# Patient Record
Sex: Female | Born: 1955
Health system: Southern US, Community
[De-identification: ages and names within clinical notes are randomized; demographics above are authoritative.]

## PROBLEM LIST (undated history)

## (undated) DIAGNOSIS — G4700 Insomnia, unspecified: Secondary | ICD-10-CM

## (undated) DIAGNOSIS — I498 Other specified cardiac arrhythmias: Secondary | ICD-10-CM

## (undated) DIAGNOSIS — G90A Postural orthostatic tachycardia syndrome (POTS): Secondary | ICD-10-CM

## (undated) DIAGNOSIS — R5381 Other malaise: Secondary | ICD-10-CM

## (undated) DIAGNOSIS — Z780 Asymptomatic menopausal state: Secondary | ICD-10-CM

## (undated) DIAGNOSIS — K5790 Diverticulosis of intestine, part unspecified, without perforation or abscess without bleeding: Secondary | ICD-10-CM

## (undated) DIAGNOSIS — I951 Orthostatic hypotension: Secondary | ICD-10-CM

## (undated) DIAGNOSIS — R000 Tachycardia, unspecified: Secondary | ICD-10-CM

## (undated) DIAGNOSIS — I429 Cardiomyopathy, unspecified: Secondary | ICD-10-CM

## (undated) DIAGNOSIS — M810 Age-related osteoporosis without current pathological fracture: Secondary | ICD-10-CM

## (undated) DIAGNOSIS — R5383 Other fatigue: Secondary | ICD-10-CM

## (undated) HISTORY — DX: Cardiomyopathy, unspecified: I42.9

## (undated) HISTORY — DX: Other specified cardiac arrhythmias: I49.8

## (undated) HISTORY — PX: OTHER SURGICAL HISTORY: SHX169

## (undated) HISTORY — DX: Other fatigue: R53.83

## (undated) HISTORY — DX: Insomnia, unspecified: G47.00

## (undated) HISTORY — PX: FINGER SURGERY: SHX640

## (undated) HISTORY — DX: Orthostatic hypotension: I95.1

## (undated) HISTORY — DX: Postural orthostatic tachycardia syndrome (POTS): G90.A

## (undated) HISTORY — DX: Other malaise: R53.81

## (undated) HISTORY — DX: Age-related osteoporosis without current pathological fracture: M81.0

## (undated) HISTORY — DX: Tachycardia, unspecified: R00.0

## (undated) HISTORY — DX: Asymptomatic menopausal state: Z78.0

---
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
## 2002-04-17 ENCOUNTER — Other Ambulatory Visit: Admission: RE | Admit: 2002-04-17 | Discharge: 2002-04-17 | Payer: Self-pay | Admitting: Internal Medicine

## 2003-09-10 ENCOUNTER — Other Ambulatory Visit: Admission: RE | Admit: 2003-09-10 | Discharge: 2003-09-10 | Payer: Self-pay | Admitting: Internal Medicine

## 2003-09-26 ENCOUNTER — Ambulatory Visit (HOSPITAL_BASED_OUTPATIENT_CLINIC_OR_DEPARTMENT_OTHER): Admission: RE | Admit: 2003-09-26 | Discharge: 2003-09-26 | Payer: Self-pay | Admitting: Allergy and Immunology

## 2003-09-26 ENCOUNTER — Encounter: Payer: Self-pay | Admitting: Pulmonary Disease

## 2004-05-16 ENCOUNTER — Encounter: Payer: Self-pay | Admitting: Sports Medicine

## 2004-09-02 ENCOUNTER — Ambulatory Visit (HOSPITAL_COMMUNITY): Admission: RE | Admit: 2004-09-02 | Discharge: 2004-09-02 | Payer: Self-pay | Admitting: Internal Medicine

## 2004-09-12 ENCOUNTER — Other Ambulatory Visit: Admission: RE | Admit: 2004-09-12 | Discharge: 2004-09-12 | Payer: Self-pay | Admitting: Internal Medicine

## 2005-08-24 ENCOUNTER — Ambulatory Visit (HOSPITAL_COMMUNITY): Admission: RE | Admit: 2005-08-24 | Discharge: 2005-08-24 | Payer: Self-pay | Admitting: Family Medicine

## 2007-10-30 ENCOUNTER — Ambulatory Visit: Payer: Self-pay | Admitting: Sports Medicine

## 2007-10-30 DIAGNOSIS — I951 Orthostatic hypotension: Secondary | ICD-10-CM | POA: Insufficient documentation

## 2007-10-30 DIAGNOSIS — M357 Hypermobility syndrome: Secondary | ICD-10-CM | POA: Insufficient documentation

## 2007-10-30 DIAGNOSIS — I341 Nonrheumatic mitral (valve) prolapse: Secondary | ICD-10-CM | POA: Insufficient documentation

## 2007-11-27 ENCOUNTER — Ambulatory Visit: Payer: Self-pay | Admitting: Sports Medicine

## 2007-11-27 DIAGNOSIS — R5383 Other fatigue: Secondary | ICD-10-CM

## 2007-11-27 DIAGNOSIS — R5381 Other malaise: Secondary | ICD-10-CM | POA: Insufficient documentation

## 2008-01-08 ENCOUNTER — Ambulatory Visit: Payer: Self-pay | Admitting: Sports Medicine

## 2008-03-11 ENCOUNTER — Ambulatory Visit: Payer: Self-pay | Admitting: Pulmonary Disease

## 2008-03-11 DIAGNOSIS — G4700 Insomnia, unspecified: Secondary | ICD-10-CM | POA: Insufficient documentation

## 2008-03-16 ENCOUNTER — Ambulatory Visit (HOSPITAL_BASED_OUTPATIENT_CLINIC_OR_DEPARTMENT_OTHER): Admission: RE | Admit: 2008-03-16 | Discharge: 2008-03-16 | Payer: Self-pay | Admitting: Family Medicine

## 2008-04-02 ENCOUNTER — Telehealth: Payer: Self-pay | Admitting: Pulmonary Disease

## 2008-04-22 ENCOUNTER — Ambulatory Visit: Payer: Self-pay | Admitting: Pulmonary Disease

## 2008-04-22 DIAGNOSIS — F518 Other sleep disorders not due to a substance or known physiological condition: Secondary | ICD-10-CM | POA: Insufficient documentation

## 2008-06-01 ENCOUNTER — Ambulatory Visit: Payer: Self-pay | Admitting: Family Medicine

## 2008-06-02 ENCOUNTER — Telehealth: Payer: Self-pay | Admitting: Family Medicine

## 2008-12-15 ENCOUNTER — Ambulatory Visit: Payer: Self-pay | Admitting: Sports Medicine

## 2008-12-15 DIAGNOSIS — G43909 Migraine, unspecified, not intractable, without status migrainosus: Secondary | ICD-10-CM | POA: Insufficient documentation

## 2008-12-22 ENCOUNTER — Telehealth: Payer: Self-pay | Admitting: Sports Medicine

## 2008-12-23 ENCOUNTER — Telehealth: Payer: Self-pay | Admitting: Sports Medicine

## 2009-01-12 ENCOUNTER — Ambulatory Visit: Payer: Self-pay | Admitting: Sports Medicine

## 2009-01-12 DIAGNOSIS — R000 Tachycardia, unspecified: Secondary | ICD-10-CM | POA: Insufficient documentation

## 2009-03-17 ENCOUNTER — Ambulatory Visit: Payer: Self-pay | Admitting: Diagnostic Radiology

## 2009-03-17 ENCOUNTER — Ambulatory Visit (HOSPITAL_BASED_OUTPATIENT_CLINIC_OR_DEPARTMENT_OTHER): Admission: RE | Admit: 2009-03-17 | Discharge: 2009-03-17 | Payer: Self-pay | Admitting: Family Medicine

## 2009-06-13 ENCOUNTER — Ambulatory Visit: Payer: Self-pay | Admitting: Sports Medicine

## 2009-07-25 ENCOUNTER — Encounter: Payer: Self-pay | Admitting: Sports Medicine

## 2009-07-26 ENCOUNTER — Ambulatory Visit: Payer: Self-pay

## 2009-08-23 ENCOUNTER — Ambulatory Visit: Payer: Self-pay | Admitting: Sports Medicine

## 2010-04-18 ENCOUNTER — Ambulatory Visit (HOSPITAL_BASED_OUTPATIENT_CLINIC_OR_DEPARTMENT_OTHER): Admission: RE | Admit: 2010-04-18 | Discharge: 2010-04-18 | Payer: Self-pay | Admitting: Specialist

## 2010-04-18 ENCOUNTER — Ambulatory Visit: Payer: Self-pay | Admitting: Diagnostic Radiology

## 2010-08-22 NOTE — Progress Notes (Signed)
Summary: Triage  Phone Note Call from Patient Call back at [phone]   Summary of Call: wants to discuss rx that was prescribed yesterday, isn't bring heart rate down, it is 120. Initial call taken by: Haydee Salter,  June 02, 2008 12:19 PM  Follow-up for Phone Call        patient states she did not take the cardizem yesterday but this AM took one tablet at 5:00 AM and at 10:00 pulse rate was not down so she took another . consulted with Dr. Darrin Nipper and he advises not to take any more today but tomorrow if pulse rate not down may take 60 mg cardizem in AM and 60 mg in PM and then continue with this dose. call back to report. Follow-up by: Theresia Lo RN,  June 02, 2008 12:32 PM

## 2010-08-22 NOTE — Assessment & Plan Note (Signed)
Summary: FOR Buckley Bradly FU POTS SYNDROME/JW   Vital Signs:  Patient profile:   55 year old female Pulse rate:   107 / minute BP sitting:   108 / 77  (left arm)  Vitals Entered By: Terese Door (January 12, 2009 2:14 PM) CC: f/u POTS SYNDROME   Primary Care Provider:  Tresa Endo ([facility]  CC:  f/u POTS SYNDROME.  History of Present Illness: S: 55 y/o female here to f/u POTS syndrome. Heart rate under good control (will go to 135 if she doesnt take beta blocker). Klonipin 0.5 mg not helping so tried 1 and then 2 mg but that still didnt help.Then she tried  Amitriptyline but made her really groggy and sedated at 25 and 12.5 mg.  The last two night she has taken 3 mg of klonipin and has had less "hangover" in the morning.   Allergies: No Known Drug Allergies  Physical Exam  General:  Well-developed,well-nourished,in no acute distress; alert,appropriate and cooperative throughout examination   Impression & Recommendations:  Problem # 1:  PERSISTENT DISORDER INITIATING/MAINTAINING SLEEP (ICD-307.42) Assessment Unchanged Recent improvement on 3 mg Klonipin at bedtime but this is a worrisomely high dose and she is likely to quickly develop tolerance.  Will instead try her on valium 5 mg (may need to increase the number of tablets every 5 days). Once sleep cycle is restored will have her get back into an exercise program. stop amitriptyline.  Problem # 2:  UNSPECIFIED TACHYCARDIA (ICD-785.0) Assessment: Improved well controlled if she takes beta blocker. Recommended she continue this a 1 full tablet of Toprol XL 25 mg   Complete Medication List: 1)  Low Estrogen Tabs  .... Take 1 tablet by mouth once a day 2)  Maxalt 10 Mg Tabs (Rizatriptan benzoate) .... 1/2 - 1 by mouth as needed migraine 3)  Metoprolol Succinate 25 Mg Xr24h-tab (Metoprolol succinate) .... 1/2 -1 by mouth qday 4)  Valium 5 Mg Tabs (Diazepam)

## 2010-08-22 NOTE — Assessment & Plan Note (Signed)
Summary: FU POTS   Vital Signs:  Patient profile:   55 year old female Height:      69 inches Weight:      150 pounds BMI:     22.23 Pulse rate:   102 / minute BP sitting:   120 / 84  Vitals Entered By: Lillia Pauls CMA (June 13, 2009 8:50 AM)  Referring Provider:  self referral Primary Provider:  Tresa Endo ([facility]   History of Present Illness: Had been doing well with POTS Had been stable on Toprol once daily for 1 year 2 to 3 wks now does not seem to be in control HR jumping with just standing Rates up to about 130 and BP drops  Taking hormones for menopausal sxs these are not standard brands  seems that the change in sxs came about 1 week after she switched hormones  nvertheless she wants to try a difft medication as the beta blocker never makes her feel good  note frequent migraine and using sumatriptan 3 to 4 times per week   Allergies: No Known Drug Allergies  Physical Exam  General:  Well-developed,well-nourished,in no acute distress; alert,appropriate and cooperative throughout examination Lungs:  Normal respiratory effort, chest expands symmetrically. Lungs are clear to auscultation, no crackles or wheezes. Heart:   regular rhythm. S1 and S2 normal without gallop, murmur, click, rub or other extra sounds.  Rate is 100 sitting Standing immediately jumps to 130 she does get dizziness with standing and then rate fluctuates from 105 to 130   Impression & Recommendations:  Problem # 1:  UNSPECIFIED TACHYCARDIA (ICD-785.0) This is challenging as she has not tolerated standard meds for POTS  will try 1 month course of verapamil xr 120 hope HR response control will balance the BP lowering effect  she will keep record hold metoprolol during this time  try to get hormone regulation standardized as this could be affecting her overall sxs  we will reck in  1 month  Problem # 2:  MIGRAINE HEADACHE (ICD-346.90)  The following medications were  removed from the medication list:    Maxalt 10 Mg Tabs (Rizatriptan benzoate) .Marland Kitchen... 1/2 - 1 by mouth as needed migraine Her updated medication list for this problem includes:       Sumatriptan Succinate 100 Mg Tabs (Sumatriptan succinate) ..... Use 1/2 to 1 once daily as needed for migraine; may repeat x 1  hoping verapamil may also cut frequency suspect headaches tied to poorer control of POTs and stress hormone release  The following medications were removed from the medication list:    Maxalt 10 Mg Tabs (Rizatriptan benzoate) .Marland Kitchen... 1/2 - 1 by mouth as needed migraine Her updated medication list for this problem includes:     Sumatriptan Succinate 100 Mg Tabs (Sumatriptan succinate) ..... Use 1/2 to 1 once daily as needed for migraine; may repeat x 1  Problem # 3:  FATIGUE (ICD-780.79) I think we should try strength work at high resistance and low reps do aerobic work on bike at [facility] after 1 month  Complete Medication List: 1)  Low Estrogen Tabs  .... Take 1 tablet by mouth once a day 2)  Metoprolol Succinate 25 Mg Xr24h-tab (Metoprolol succinate) .... 1/2 -1 by mouth qday 3)  Valium 5 Mg Tabs (Diazepam) 4)  Sumatriptan Succinate 100 Mg Tabs (Sumatriptan succinate) .... Use 1/2 to 1 once daily as needed for migraine; may repeat x 1 5)  Diazepam 5 Mg Tabs (Diazepam) .Marland KitchenMarland KitchenMarland Kitchen 1  by mouth at bedtime prn 6)  Verapamil Hcl Cr 120 Mg Cr-tabs (Verapamil hcl) .Marland Kitchen.. 1 by mouth qd Prescriptions: VALIUM 5 MG TABS (DIAZEPAM)   #30 x 3   Entered by:   Lillia Pauls CMA   Authorized by:   Enid Baas MD   Signed by:   Lillia Pauls CMA on 06/13/2009   Method used:   Print then Give to Patient   RxID:   [id number] VERAPAMIL HCL CR 120 MG CR-TABS (VERAPAMIL HCL) 1 by mouth qd  #30 x 3   Entered and Authorized by:   Enid Baas MD   Signed by:   Enid Baas MD on 06/13/2009   Method used:   [facility] [phone]* (retail)       [address]        [place], [place]  [address]       Ph: [phone]       Fax: [phone]   RxID:   [id number]

## 2010-08-22 NOTE — Assessment & Plan Note (Signed)
Summary: np/husband kim sees Tracy Cook/eo   Vital Signs:  Patient Profile:   55 Years Old Female Height:     70 inches Weight:      149.8 pounds BMI:     21.57 Pulse rate:   110 / minute BP sitting:   123 / 87  (right arm)  Pt. in pain?   no  Vitals Entered By: Arlyss Repress CMA, (October 30, 2007 8:44 AM)              Is Patient Diabetic? No     Chief Complaint:  discuss posturel orthostatic tachycardia syndrome. resting pulse rate 90. pulse up to 132.  History of Present Illness: Fascinating hx of POTS  HR goes to 130 with standing had syncope in shower while young but not with sports or activity has associated GI sxs - bloating and constipation often Gymnast in HS has also associated hypermobility and was even assessed to be sure not marfan's  Went to [facility] where they are researching this: + tilt table Trial fluid loading - did not help florinef - did not help SSRIs - felt terrible/ trial wellbutrin flet better but WBCs droppped Conidine - no help metoprolol - low dose  controlled HR but BP dropped and felt tired all time now off med for 3 years  Now this is worse no syncope HR often runs 120 at rest standing at 130 to 135 she seems fatigued much of time cannot workout as this makes it much worse and she is too tired wants to consider new RX     Social History:    works as Designer, industrial/product    married kim Sakamoto - Clinical research associate in [employer]   Risk Factors:  Tobacco use:  never    Physical Exam  General:     Well-developed,well-nourished,in no acute distress; alert,appropriate and cooperative throughout examination Head:     Normocephalic and atraumatic without obvious abnormalities. No apparent alopecia or balding. Lungs:     Normal respiratory effort, chest expands symmetrically. Lungs are clear to auscultation, no crackles or wheezes. Heart:     Mid systolic click is intemittent HR 120 at rest Standing HR is 132 Valsalva drops to 74 but then comes  back up in 30 secs to 120 + no murmur.   Msk:     + Hypermobility screen elbows > 180 deg Lt thumb to wrist hands to floor no recurvatum shoulder hypermobility Lt MCP increased mobility Beighton score is 4 to 5  no pectus no lens prob no wing span > height skin - not excessively lax    Impression & Recommendations:  Problem # 1:  POSTURAL HYPOTENSION (ICD-458.0) Has good hx and many concomitant features or POTS she has had good eval at [facility] and I would like to review tests trial on many meds  beta blockers worked but she did not tolerate metoprolol and others never tried Coreg will start slow and build up see in 1 mo  Orders: [facility]- Est  Level 4 ([address]782)   Problem # 2:  HYPERMOBILITY SYNDROME (ICD-728.5) would like to eval echo no rx at present Orders: [facility]- Est  Level 4 ([address]214)   Problem # 3:  MITRAL VALVE PROLAPSE (ICD-424.0)  Her updated medication list for this problem includes:    Carvedilol 3.125 Mg Tabs (Carvedilol) .Marland Kitchen... 1 by mouth bid  stable but may benefit from coreg Orders: [facility]- Est  Level 4 ([address])   Complete Medication List: 1)  Carvedilol 3.125 Mg  Tabs (Carvedilol) .Marland Kitchen.. 1 by mouth bid   Patient Instructions: 1)  start 3.125 coreg twice a day 3 days 2)  then 6.25 in AM and 3.125 inPM 3days 3)  then 6.25 two times a day for 1 week 4)  hold here if doing OK 5)  otherwise 6)  9.375 in AM and 6.25 in PM for 1 week 7)  then 9.375 twice a day 8)  for 1 week 9)  then recheck    Prescriptions: CARVEDILOL 3.125 MG  TABS (CARVEDILOL) 1 by mouth bid  #100 x 3   Entered and Authorized by:   Enid Baas MD   Signed by:   Enid Baas MD on 10/30/2007   Method used:   Electronically sent to ...       [facility] [address] # [phone] [address]       [place], [place]  [address]       Ph: [phone]       Fax: [phone]   RxID:   [id number]  ]

## 2010-08-22 NOTE — Assessment & Plan Note (Signed)
Summary: 1 MONTH FU/EO   Vital Signs:  Patient Profile:   55 Years Old Female Height:     70 inches Pulse rate:   94 / minute BP sitting:   114 / 84  (left arm)  Pt. in pain?   no  Vitals Entered By: Arlyss Repress CMA, (Nov 27, 2007 11:41 AM)              Is Patient Diabetic? No     Chief Complaint:  f/up and discuss echocardiogram.  History of Present Illness: POTS  Today brings hx records from medication trials at [facility] she tried multiple meds with sideeffects from most some did stop tachycardia but sxs too severe  ECHO - some MVP  EKG - not remarkable  Since last visit on Coreg 3.125 she does control rate much better Finds she has more constipation and if she tries to take dose higher she feels tired However, she feels tired every day with tachycardia feels she cannot exrecise because of fatigue  GI sxs of constipation are much worse with most medicines  Sleep disorder is a major issue Never feels rested MM ache at night when she tried melatonin she ran into side effects of grogginess next day  [facility] trial documents response to mult drugs baseline is HR 71 and standing of 129 various meds dropped standing HR to 100 range key prob has been drug intolerance and perhaps discouraged as she does not wish to take drugs        Physical Exam  General:     Well-developed,well-nourished,in no acute distress; alert,appropriate and cooperative throughout examination Head:     Normocephalic and atraumatic without obvious abnormalities. No apparent alopecia or balding. Heart:     now has resting HR of 84 standing on 3 trials after single dose of Coreg 3.125 shows HR peaks at 109 adn within 20 secs drops into 80 to 90 range  I do not hear click or Murmur today    Impression & Recommendations:  Problem # 1:  POSTURAL HYPOTENSION (ICD-458.0) I think she needs a trial of coreg 3.125 for 1 mo and see if sideeffects wain aggressively tx the  constipation so this does not become worse does not drink at work  nees to get 20 oz gatorade at breaks Orders: [facility]- Est  Level 4 ([address])   Problem # 2:  INSOMNIA D/T MEDICAL COND CLASSIFIED ELSEWHERE (ICD-327.01) This is part of overall syndrome and a major cause of her fatiigue suggest low dose amitriptyline 10 mg at HS and increase to 20 if no response  She may need a trial away from work and see if we can reestablish a baseline exercise level that will help her deal with life better and work to control excess fatigue and POTS  re ck 1 mo Orders: [facility]- Est  Level 4 ([address])   Problem # 3:  FATIGUE, CHRONIC (ICD-780.79) this is associated with POTS but also she has chronic mm aches will try low dose tramadol to see if we can lessen these sxs  Complete Medication List: 1)  Carvedilol 3.125 Mg Tabs (Carvedilol) .Marland Kitchen.. 1 by mouth bid 2)  Tramadol Hcl 50 Mg Tabs (Tramadol hcl) .Marland Kitchen.. 1 by mouth q6 hr prn 3)  Amitriptyline Hcl 10 Mg Tabs (Amitriptyline hcl) .Marland Kitchen.. 1 or 2 by mouth at bedtime as directed    Prescriptions: AMITRIPTYLINE HCL 10 MG  TABS (AMITRIPTYLINE HCL) 1 or 2 by mouth at bedtime as directed  #60 x  3   Entered and Authorized by:   Enid Baas MD   Signed by:   Enid Baas MD on 11/27/2007   Method used:   Electronically sent to ...       [facility] # [phone] [address]. [address]       [place], [place]  [address]       Ph: [phone]       Fax: [phone]   RxID:   [id number] TRAMADOL HCL 50 MG  TABS (TRAMADOL HCL) 1 by mouth q6 hr prn  #100 x 3   Entered and Authorized by:   Enid Baas MD   Signed by:   Enid Baas MD on 11/27/2007   Method used:   Electronically sent to ...       [facility] [address] # [phone] [address]       [place], [place]  [address]       Ph: [phone]       Fax: [phone]   RxID:   [id number]  ]

## 2010-08-22 NOTE — Progress Notes (Signed)
Summary: increase did not help  Phone Note Call from Patient Call back at [phone]   Caller: Patient Reason for Call: Talk to Nurse, Talk to Doctor Summary of Call: Increasing med did not help her to sleep.  [facility]. Initial call taken by: Levada Schilling,  December 23, 2008 10:22 AM    I advised her to stick with this dose for 3 or 4 days.  If not better at that point I would suggest we try ambien.

## 2010-08-22 NOTE — Letter (Signed)
Summary: [facility]  [facility]   Imported By: Marily Memos 08/23/2009 09:28:42  _____________________________________________________________________  External Attachment:    Type:   Image     Comment:   External Document

## 2010-08-22 NOTE — Progress Notes (Signed)
Summary: increase med  Phone Note Call from Patient Call back at [phone]   Caller: Patient Reason for Call: Talk to Nurse, Talk to Doctor Summary of Call: Wants to increase clonazepam dose.  Request return call. Initial call taken by: Levada Schilling,  December 22, 2008 2:28 PM    OK to increase Klonopin to 1 mgm at hs and see if this helps her get into a sleep pattern. she will let us know response.

## 2010-08-22 NOTE — Assessment & Plan Note (Signed)
Summary: FOR Tracy Cook/JW   Vital Signs:  Patient profile:   55 year old female Height:      69 inches Weight:      145 pounds Pulse rate:   86 / minute BP sitting:   120 / 70  Vitals Entered By: Lillia Pauls CMA (Dec 15, 2008 2:13 PM)  Primary Care Provider:  Tresa Endo ([facility]   History of Present Illness: 55 y/o female with POTS syndrome here to discuss management. She is currently taking 1/2 -1 metoprolol 25 mg tablets daily. This is working well to control her tachycardia (has tried CCB, florinef, and NACL in the past).  C/o fatigue, insomnia, and decreased exercise tolerance. also she is worried that she may have Marfans syndrome (in the past we diagnosed her as benign hypermobility syndrome and she did not have physical characteristics to qualify as Marfans).  Describes problems falling asleep. in the past she has tried clonipin which helped a lot but she stopped it because was developing a tolerance. she also thinks she has restless leg syndrome. she has had a normal sleep study. we also looked at her ECHO study from 5 years ago and the aorta was normal  Allergies: No Known Drug Allergies  Physical Exam  General:  Well-developed,well-nourished,in no acute distress; alert,appropriate and cooperative throughout examination Lungs:  Normal respiratory effort, chest expands symmetrically. Lungs are clear to auscultation, no crackles or wheezes. Heart:  midsystolic click   Impression & Recommendations:  Problem # 1:  PERSISTENT DISORDER INITIATING/MAINTAINING SLEEP (ICD-307.42) start clonipin to help reset sleep cycle. can try alternate this with another sleep aid.   Problem # 2:  POSTURAL HYPOTENSION (ICD-458.0) Assessment: Improved tachycardia better -controlled. continue metoprolol. will have her try to restart exercise program once her sleep is better. also we do not think she has Marfans  Problem # 3:  FATIGUE (ICD-780.79) Assessment: New due to inadequate  sleep  Problem # 4:  MIGRAINE HEADACHE (ICD-346.90) Assessment: Unchanged refilled maxalt Her updated medication list for this problem includes:    Maxalt 10 Mg Tabs (Rizatriptan benzoate) .Marland Kitchen... 1/2 - 1 by mouth as needed migraine    Metoprolol Succinate 25 Mg Xr24h-tab (Metoprolol succinate) .Marland Kitchen... 1/2 -1 by mouth qday  Complete Medication List: 1)  Low Estrogen Tabs  .... Take 1 tablet by mouth once a day 2)  Rozerem 8 Mg Tabs (Ramelteon) .... Take 1 tab by mouth at bedtime 3)  Klonopin 0.5 Mg Tabs (Clonazepam) .Marland Kitchen.. 1 by mouth at bedtime prn 4)  Maxalt 10 Mg Tabs (Rizatriptan benzoate) .... 1/2 - 1 by mouth as needed migraine 5)  Metoprolol Succinate 25 Mg Xr24h-tab (Metoprolol succinate) .... 1/2 -1 by mouth qday  Patient Instructions: 1)  f/u in 1 month 2)  in a week(after you are sleeping better)  try to start 10 minutes of exercise twice daily  3)  start Klonipin as needed for sleep  4)  we refilled maxalt and metoprolol Prescriptions: METOPROLOL SUCCINATE 25 MG XR24H-TAB (METOPROLOL SUCCINATE) 1/2 -1 by mouth qday  #30 x 5   Entered and Authorized by:   Gabrielle Dare MD   Signed by:   Gabrielle Dare MD on 12/15/2008   Method used:   [facility]        [facility] [phone]* (retail)       [address]       [place], [place]  [address]  Ph: [phone]       Fax: [phone]   RxID:   [id number] MAXALT 10 MG TABS (RIZATRIPTAN BENZOATE) 1/2 - 1 by mouth as needed migraine  #12 x 6   Entered and Authorized by:   Gabrielle Dare MD   Signed by:   Gabrielle Dare MD on 12/15/2008   Method used:   [facility] to        [facility] [phone]* (retail)       [address]       [place], [place]  [address]       Ph: [phone]       Fax: [phone]   RxID:   [id number] KLONOPIN 0.5 MG TABS (CLONAZEPAM) 1 by mouth at bedtime prn  #30 x 2   Entered and Authorized by:   Gabrielle Dare  MD   Signed by:   Gabrielle Dare MD on 12/15/2008   Method used:   Handwritten   RxID:   [id number]

## 2010-08-22 NOTE — Assessment & Plan Note (Signed)
Summary: beta blocker wp   Vital Signs:  Patient Profile:   55 Years Old Female Height:     70 inches Weight:      150.8 pounds BMI:     21.72 Temp:     97.9 degrees F oral Pulse rate:   135 / minute BP sitting:   116 / 82  (left arm) Cuff size:   regular  Pt. in pain?   no  Vitals Entered By: Garen Grams LPN (June 01, 2008 10:26 AM)                   Referred by:  self referral PCP:  Tresa Endo ([facility]  Chief Complaint:  wants to dicuss medication.  History of Present Illness: Patient formerly of Dr. Darrick Penna, here for followup on POTS syndrome which she has been dealing with for over 10 years.  Initial workup included cardiologist workup with tilt table testing; she has been treated with high-salt diet and florinef, neither of which were helpful.  Dr Darrick Penna had started her with Coreg and this was changed to Metoprolol, which causes less fatigue and joint aches than the Coreg.    She is interested in getting off the B blockers and trying a different type of medication to control her pulse rate.  She does not suffer from hypotension as much as fast pulse.  Wears a wrist pulse meter.     Current Allergies: No known allergies       Physical Exam  General:     Well-developed,well-nourished,in no acute distress; alert,appropriate and cooperative throughout examination Lungs:     Normal respiratory effort, chest expands symmetrically. Lungs are clear to auscultation, no crackles or wheezes. Heart:     Normal rate and regular rhythm. S1 and S2 normal without gallop, murmur, click, rub or other extra sounds. Pulses:     Normal radial pulses bilaterally.     Impression & Recommendations:  Problem # 1:  POSTURAL HYPOTENSION (ICD-458.0) Patient with a long history of POTS syndrome, and this has not been satisfactorily treated with florinef or NaCl.  She is not keen on staying on the B blockers due to side effects.  We have discussed other sympatholytic  medications, would consider treatment with diltiazem at low doses for SA nodal blockade. She may go up from 30mg  two times a day to 60mg  two times a day if no improvement.    Also would consider use of midodrine or methyldopa if not improved with the cardizem.  She is encouraged to call the office if not improved, and we can discuss other options.  Orders: [facility]- Est  Level 4 ([address])   Complete Medication List: 1)  Low Estrogen Tabs  .... Take 1 tablet by mouth once a day 2)  Rozerem 8 Mg Tabs (Ramelteon) .... Take 1 tab by mouth at bedtime 3)  Cardizem 30 Mg Tabs (Diltiazem hcl) .... Sig: take 1 tab by mouth two times a day    Prescriptions: CARDIZEM 30 MG TABS (DILTIAZEM HCL) SIG: Take 1 tab by mouth two times a day  #60 x 4   Entered and Authorized by:   Paula Compton MD   Signed by:   Paula Compton MD on 06/01/2008   Method used:   [facility] to        [facility] [phone]* (retail)       [address]       [place],  [place]  [address]       Ph: [phone]       Fax: [phone]   RxID:   [id number]  ]

## 2010-08-22 NOTE — Progress Notes (Signed)
Summary: Rozerem rx  Phone Note Call from Patient Call back at [phone]   Caller: Patient Call For: Roni Scow Summary of Call: need prescript for rozerem 8 mg had samples only pharmacy [facility]  [phone] Initial call taken by: Rickard Patience,  April 02, 2008 10:42 AM  Follow-up for Phone Call        [facility]. Pt seen 03-11-08 and KC wanted to see pt back in 3 weeks. Pt's next ov is on 04-22-08. KC out until 04-05-08. Will have to get ok to send RX on Rozerem. Michel Bickers [facility]  April 02, 2008 11:24 AM  Additional Follow-up for Phone Call Additional follow up Details #1::        ok to call in enough to get him to his ov . Additional Follow-up by: Barbaraann Share MD,  April 05, 2008 12:18 PM    Additional Follow-up for Phone Call Additional follow up Details #2::    Sent in rx for Rozerem electronically to pharmacy per [facility].  Attempted to notify pt.  LMOM rx requested has been sent to pharmacy.  Advised pt TCB with any prblems or questions. Follow-up by: Cloyde Reams RN,  April 05, 2008 1:07 PM  New/Updated Medications: ROZEREM 8 MG TABS (RAMELTEON) Take 1 tab by mouth at bedtime   Prescriptions: ROZEREM 8 MG TABS (RAMELTEON) Take 1 tab by mouth at bedtime  #30 x 0   Entered by:   Cloyde Reams RN   Authorized by:   Barbaraann Share MD   Signed by:   Cloyde Reams RN on 04/05/2008   Method used:   [facility] to        [facility] [phone]* (retail)       [address]       [place], [place]  [address]       Ph: [phone]       Fax: [phone]   RxID:   [id number]

## 2010-08-22 NOTE — Assessment & Plan Note (Signed)
Summary: F/U POTS   Vital Signs:  Patient profile:   55 year old female BP sitting:   104 / 84  Vitals Entered By: Lillia Pauls CMA (July 26, 2009 10:02 AM)  Referring Provider:  self referral Primary Provider:  Tresa Endo ([facility].)   History of Present Illness: Pt presents for follow up of POTS disease. At her last visit, she was placed on Verapamil. She took this for one week but felt continued fatigue more than she used to with just her Toprol. She has also been looking into a program at [facility] in [place] with Dr. Lenis Noon about a high sodium diet of 8 grams of sodium daily in addition to certain exercises without any medications. She tried this herself at home for about 10 days but was not able to tolerate it because her heart rate was staying at about 150 beats simply at rest. She woke up at night because of chest pain so then stopped this as well. She then went back to her routine of Toprol XL 25mg  daily plus weight training which she has been doing regularly with a trainer now for 6-8 weeks. She is doing well overall now but is still interested in the program at [facility].   Allergies: No Known Drug Allergies  Physical Exam  General:  Well-developed,well-nourished,in no acute distress; alert,appropriate and cooperative throughout examination Heart:  on standing her heart monitor watch shows that without her toprol within 2 minutes her heart rate climbs to 150 range while seated with no meds she stays in the 125 - 130 range   Impression & Recommendations:  Problem # 1:  UNSPECIFIED TACHYCARDIA (ICD-785.0) cont on toprol for now we will review program thru Dr Lenis Noon supplement salt and gatorade - 1/2 tsp in 20 ozs of gatorade two times a day to qid   monitor sxs and HR with log for next month  Problem # 2:  FATIGUE (ICD-780.79) continue working with personal trainer I think exercise program may lessen sxs and degree of cardiac stress  Complete Medication  List: 1)  Low Estrogen Tabs  .... Take 1 tablet by mouth once a day 2)  Metoprolol Succinate 25 Mg Xr24h-tab (Metoprolol succinate) .... 1/2 -1 by mouth qday 3)  Valium 5 Mg Tabs (Diazepam) 4)  Sumatriptan Succinate 100 Mg Tabs (Sumatriptan succinate) .... Use 1/2 to 1 once daily as needed for migraine; may repeat x 1 5)  Diazepam 5 Mg Tabs (Diazepam) .Marland Kitchen.. 1 by mouth at bedtime prn 6)  Verapamil Hcl Cr 120 Mg Cr-tabs (Verapamil hcl) .Marland Kitchen.. 1 by mouth qd  Patient Instructions: 1)  Please add 1/2 teaspoon of salt to a 20 ounce Gatorade to increase your sodium intake. Take this 2-4 times per day.  2)  Keep up your strength program with your trainer and focus on your legs. 3)  Continue your Toprol at this time. 4)  Discontinue your Verapamil.

## 2010-08-22 NOTE — Assessment & Plan Note (Signed)
Summary: self referral for insomnia   Referred by:  self referral PCP:  Tresa Endo ([facility]   History of Present Illness: the patient is a 55 year old female who comes in today as a self-referral for insomnia. Patient states that she's had sleeping issues for 30 years, and these have primarily consisted of difficulties initiating and maintaining sleep. She has had a sleep study in 2005 that showed no significant sleep apnea, and interestingly she had a total sleep time of 383 minutes on that study. The patient has tried various relaxation tapes and other behavioral changes without improvement. She typically will go to bed about 10 PM, and will typically toss and turn in the bed for one to 2 hours.  She will sometimes get up and do housework, read in bed, or get something to eat and drink. Typically, if she goes to bed between one and 2 AM she can often sleep through the night. She will usually awakens 3 or 4 times a night, and be awake each time 30-60 minutes. She does not know what awakens her. She denies any restless leg syndrome symptoms, and is not aware if she has kicking  issues during the night. She has a lot of frustration about going to sleep, and this can get worse as the night goes on. She obviously is not rested in the mornings upon awakening, and describes a lot of fatigue much greater than sleepiness. She does not drink coffee past 11 or 12, but admits to drinking up to 5 cups prior to that time. She will drink a maximum of one caffeinated soda a day. She admits to taking a nap almost every afternoon.      Current Allergies: No known allergies   Past Medical History:        MITRAL VALVE PROLAPSE (ICD-424.0)    postural orthostatic tachycardia        Family History:    heart disease: father (m.i.)  Social History:    works as Designer, industrial/product    married kim Gervin - Clinical research associate in [employer]    pt has children.    Patient never smoked.    Risk Factors:  Tobacco use:   never   Review of Systems      See HPI   Vital Signs:  Patient Profile:   54 Years Old Female Height:     70 inches Weight:      154.38 pounds O2 Sat:      97 % O2 treatment:    Room Air Temp:     98.2 degrees F oral Pulse rate:   75 / minute BP sitting:   94 / 60  (left arm) Cuff size:   regular  Vitals Entered By: Cyndia Diver LPN (March 11, 2008 3:16 PM)             Is Patient Diabetic? No Comments Medications reviewed with patient Cyndia Diver LPN  March 11, 2008 3:16 PM      Physical Exam  General:     thin female in nad Eyes:     PERRLA and EOMI.   Nose:     patent without discharge Mouth:     clear Neck:     no jvd, tmg, ln Lungs:     totally clear Heart:     rrr, no mrg Abdomen:     benign Extremities:     no edema, pulses intact distally Neurologic:     alert and oriented, moves all four extremities.  Impression & Recommendations:  Problem # 1:  PERSISTENT DISORDER INITIATING/MAINTAINING SLEEP (ICD-307.42) the pt has longstanding insomnia that I think is due to psychophysiologic insomnia along with sleep hygeine issues.  I have gone over the various ways to improve her sleep hygeine, and have also outlined the basis for stimulus control therapy and sleep restriction therapy.  We have discussed the role of sleep medications, and how they should be used short term only.  The mainstay of treatment for insomnia is behavioral.  The pt is agreeable to trying this approach.  Medications Added to Medication List This Visit: 1)  Low Estrogen Tabs  .... Take 1 tablet by mouth once a day   Patient Instructions: 1)  work on behavioral changes we discussed 2)  no napping during day 3)  limit caffeine. 4)  trial of rozerem at bedtime 5)  f/u with me in 3 wks.   ]

## 2010-08-22 NOTE — Assessment & Plan Note (Signed)
Summary: fu/eo   Vital Signs:  Patient Profile:   55 Years Old Female Height:     70 inches Weight:      148.7 pounds BMI:     21.41 Pulse rate:   92 / minute BP sitting:   112 / 78  (right arm)  Pt. in pain?   no  Vitals Entered By: Renato Battles slade,cma              Is Patient Diabetic? No     Chief Complaint:  f/up postural tachycardia. takes metoprolol er 25mg  and works best for her. .  History of Present Illness: Tracy Cook is doing much better she wears a heart rate monitor on her wrist she was seeing a benefit of the coreg but still felt fatigue HR usually 95 at rest and only 116 on standing without med Hr 110 at rest and 140 standing  tried some metoprolol xr 25 - usually 1/2 to 1 tab she thinks she is having less sideeffects and better control on this than coreg resting HR was 90 on this and about 111 on standing  has resumed exercise as we discussed after restarting the meds now up to 2 miles and several runs per week         Physical Exam  General:     Well-developed,well-nourished,in no acute distress; alert,appropriate and cooperative throughout examination Head:     Normocephalic and atraumatic without obvious abnormalities. No apparent alopecia or balding. Heart:     Postural HR today with resting  ~ 90 standing  up to 111 quickly adjusts to  ~ 106    Impression & Recommendations:  Problem # 1:  POSTURAL HYPOTENSION (ICD-458.0) POTS syndrome is clearly improved would push exercise up steadily but slowly increase aerobic for next 2 months continue on metoprolol 25  once she is doing well aerobically add strength work lower reps and higher weight work major mm groups I will discuss this when ready to start  see me in 3 to 4 mos Orders: [facility]  3 ([address])   Complete Medication List: 1)  Toprol Xl 25 Mg Tb24 (Metoprolol succinate) .... Take 1/2 to 1 tablet daily    Prescriptions: TOPROL XL 25 MG  TB24 (METOPROLOL SUCCINATE)  take 1/2 to 1 tablet daily  #30 x 11   Entered and Authorized by:   Enid Baas MD   Signed by:   Enid Baas MD on 01/08/2008   Method used:   Electronically sent to ...       [address] [address] # [phone] [address]       [place], [place]  [address]       Ph: [phone]       Fax: [phone]   RxID:   [id number]  ]

## 2010-08-22 NOTE — Assessment & Plan Note (Signed)
Summary: rov for insomnia   PCP:  Tresa Endo ([facility]  Chief Complaint:  Pt is here for a follow up appt.  Pt states Rozerem has helped symptoms.  Pt needs rx for Rozerem.  Marland Kitchen  History of Present Illness: the patient comes in today for followup of her long-standing insomnia. At the last visit, we instituted stimulus control therapy as well as sleep hygiene improvement. The patient was also started on Rozerem as a trial at bed time. Since the last visit, she has seen a definite improvement in her sleep initiation, and is now able to average about 5 hours of sleep per night. She has kept a sleep log which verifies this. Overall, she is pleased with her progress in a very short period of time.        Review of Systems      See HPI   Vital Signs:  Patient Profile:   55 Years Old Female Height:     70 inches O2 Sat:      98 % O2 treatment:    Room Air Temp:     98.3 degrees F oral Pulse rate:   89 / minute BP sitting:   100 / 60  (left arm) Cuff size:   regular  Vitals Entered By: Cyndia Diver LPN (April 22, 2008 2:32 PM)             Comments Medications reviewed with patient Cyndia Diver LPN  April 22, 2008 2:33 PM      Physical Exam  General:     thin female in no acute distress      Impression & Recommendations:  Problem # 1:  PERSISTENT DISORDER INITIATING/MAINTAINING SLEEP (ICD-307.42) the patient has seen significant improvement since the last visit. I have asked her to continue on the various behavioral therapies, as well as the Rozerem for now. I have cautioned her against getting back into her old sleep habits, and she will try to be vigilant. If she continues to improve, I will see her again in approximately 6 months. As she gets better, I would like her to try to come off the Rozerem.   Patient Instructions: 1)  continue behavioral changes as we have outlined 2)  continue rozerem for now.   3)  f/u with me in 6mos, or sooner if having  issues.   Prescriptions: ROZEREM 8 MG TABS (RAMELTEON) Take 1 tab by mouth at bedtime  #30 x 5   Entered and Authorized by:   Barbaraann Share MD   Signed by:   Barbaraann Share MD on 04/22/2008   Method used:   Print then Give to Patient   RxID:   [id number]  ]

## 2010-08-22 NOTE — Assessment & Plan Note (Signed)
Summary: f/U,MC   Referring Provider:  self referral Primary Provider:  Tresa Endo ([facility]   History of Present Illness: Started gatorade plus 1/2 tsp salt three times a day Eating high salt foods Exercises - using weights 3x weekly  able to run a 5K this past month at 11 min mile pace  the POTS sxs started prob 10 years ago when she noted she would almost pass out between side changes in tennis unless she sat down  Wrkup ultimately completed at [facility] Metoprolol 1/2 tab of 25 mgm if rate over 120 takes whole tab Uses pulse monitor to follow HR  With running gets to 170 - if higher sit down  Now with sitting she is noticing that pulse is dropping much faster and is now drops to 120 range with exercise or 80s when not  Allergies: No Known Drug Allergies  Physical Exam  General:  Well-developed,well-nourished,in no acute distress; alert,appropriate and cooperative throughout examination Heart:  heart rate 100 sitting now on standing was only 110 in 30 secs last visit was 130   Impression & Recommendations:  Problem # 1:  UNSPECIFIED TACHYCARDIA (ICD-785.0) cont regimen gatorade build weight program cont monitoring  Problem # 2:  FATIGUE (ICD-780.79) add creatine 5 gms on days that you do not get red meat  reck in 3 months  keep metoprolol at 25  Complete Medication List: 1)  Low Estrogen Tabs  .... Take 1 tablet by mouth once a day 2)  Metoprolol Succinate 25 Mg Xr24h-tab (Metoprolol succinate) .Marland Kitchen.. 1 by mouth qd 3)  Valium 5 Mg Tabs (Diazepam) 4)  Sumatriptan Succinate 100 Mg Tabs (Sumatriptan succinate) .... Use 1/2 to 1 once daily as needed for migraine; may repeat x 1 5)  Diazepam 5 Mg Tabs (Diazepam) .Marland Kitchen.. 1 by mouth at bedtime prn 6)  Verapamil Hcl Cr 120 Mg Cr-tabs (Verapamil hcl) .Marland Kitchen.. 1 by mouth qd Prescriptions: METOPROLOL SUCCINATE 25 MG XR24H-TAB (METOPROLOL SUCCINATE) 1 by mouth qd  #30 x 11   Entered and Authorized by:   Enid Baas  MD   Signed by:   Enid Baas MD on 08/23/2009   Method used:   [facility] to        [facility] [phone][facility] (retail)       [address]       [place], [place]  [address]       Ph: [phone]       Fax: [phone]   RxID:   [id number]

## 2011-07-24 HISTORY — PX: COLONOSCOPY: SHX174

## 2011-09-17 ENCOUNTER — Encounter: Payer: Self-pay | Admitting: Cardiology

## 2011-09-27 ENCOUNTER — Telehealth: Payer: Self-pay | Admitting: Cardiology

## 2011-09-27 NOTE — Telephone Encounter (Signed)
Fu call °Patient returning your call °

## 2011-10-12 ENCOUNTER — Ambulatory Visit: Payer: Self-pay | Admitting: Cardiology

## 2011-10-30 ENCOUNTER — Ambulatory Visit: Payer: Self-pay | Admitting: Cardiology

## 2011-11-07 ENCOUNTER — Encounter: Payer: Self-pay | Admitting: Cardiology

## 2011-11-07 ENCOUNTER — Ambulatory Visit (INDEPENDENT_AMBULATORY_CARE_PROVIDER_SITE_OTHER): Payer: BC Managed Care – PPO | Admitting: Cardiology

## 2011-11-07 VITALS — BP 100/68 | HR 92 | Ht 69.0 in | Wt 135.1 lb

## 2011-11-07 DIAGNOSIS — I428 Other cardiomyopathies: Secondary | ICD-10-CM

## 2011-11-07 DIAGNOSIS — I951 Orthostatic hypotension: Secondary | ICD-10-CM

## 2011-11-07 DIAGNOSIS — I517 Cardiomegaly: Secondary | ICD-10-CM

## 2011-11-07 DIAGNOSIS — I420 Dilated cardiomyopathy: Secondary | ICD-10-CM | POA: Insufficient documentation

## 2011-11-07 DIAGNOSIS — R079 Chest pain, unspecified: Secondary | ICD-10-CM

## 2011-11-07 NOTE — Patient Instructions (Signed)
Your physician recommends that you schedule a follow-up appointment in: AS NEEDED PENDING TEST RESULTS  Your physician has requested that you have a MUGA: A multigated acquisition (MUGA) scan is a test that looks at the chambers and blood vessels of the heart. Please see the CareNotes handout/brochure given to you today for further information.    

## 2011-11-07 NOTE — Assessment & Plan Note (Signed)
Recent stress echocardiogram with question mildly reduced LV function at baseline. Plan MUGA to better assess LV function. Further recommendations based on results.

## 2011-11-07 NOTE — Progress Notes (Signed)
  HPI: 56 year-old female for evaluation of chest pain and reduced LV function. H/O POTS. Stress echocardiogram performed in [place] in February of 2013 showed an ejection fraction of 45% at rest that improves to 70% with exercise. The patient has had occasional chest pain for 3-4 years. It is in the left upper chest and described as a sharp pain. It typically lasts 1 minute and resolve spontaneously. It is not pleuritic, positional, related to food or exertion. No associated symptoms. She had a more severe episode in January which lasted 3 minutes. She was seen by her primary care physician in the above stress echocardiogram was ordered. Note she has mild dyspnea on exertion but no orthopnea, PND or pedal edema. She has long-standing POTS and has some tachycardia with standing. However she denies syncope. Her symptoms are reasonably well controlled with increased fluid, sodium intake and propranolol. Because of her chest pain and reduced LV function noted on resting images were asked to evaluate.  Current Outpatient Prescriptions  Medication Sig Dispense Refill  . propranolol (INDERAL) 20 MG tablet Take 20 mg by mouth daily.      . SUMATRIPTAN SUCCINATE PO Take 100 mg by mouth as needed. 1/2 tab -1 tab daily as needed      . zolpidem (AMBIEN) 10 MG tablet Take 10 mg by mouth as needed.         No Known Allergies  Past Medical History  Diagnosis Date  . Tachycardia, unspecified   . Other malaise and fatigue   . Migraine   . POTS (postural orthostatic tachycardia syndrome)   . Persistent disorder of initiating or maintaining sleep   . Orthostatic hypotension   . Insomnia     No past surgical history on file.  History   Social History  . Marital Status: Married    Spouse Name: N/A    Number of Children: 2  . Years of Education: N/A   Occupational History  .      Medial lab technologist   Social History Main Topics  . Smoking status: Never Smoker   . Smokeless tobacco: Not on  file  . Alcohol Use: No  . Drug Use: Not on file  . Sexually Active: Not on file   Other Topics Concern  . Not on file   Social History Narrative  . No narrative on file    Family History  Problem Relation Age of Onset  . Coronary artery disease Father     Died of MI at age 24    ROS: Problems with fatigue but no fevers or chills, productive cough, hemoptysis, dysphasia, odynophagia, melena, hematochezia, dysuria, hematuria, rash, seizure activity, orthopnea, PND, pedal edema, claudication. Remaining systems are negative.  Physical Exam:   Blood pressure 100/68, pulse 92, height 5\' 9"  (1.753 m), weight 135 lb 1.3 oz (61.272 kg).  General:  Well developed/well nourished in NAD Skin warm/dry Patient not depressed No peripheral clubbing Back-normal HEENT-normal/normal eyelids Neck supple/normal carotid upstroke bilaterally; no bruits; no JVD; no thyromegaly chest - CTA/ normal expansion CV - RRR/normal S1 and S2; no murmurs, rubs; positive S4; PMI nondisplaced Abdomen -NT/ND, no HSM, no mass, + bowel sounds, no bruit 2+ femoral pulses, no bruits Ext-no edema, chords, 2+ DP Neuro-grossly nonfocal  ECG noted at time of treadmill in February-sinus rhythm with no ST changes.

## 2011-11-07 NOTE — Assessment & Plan Note (Signed)
Symptoms atypical. Recent stress echocardiogram not suggestive of ischemia. No further ischemia evaluation.

## 2011-11-07 NOTE — Assessment & Plan Note (Signed)
Symptoms are reasonably well controlled with present measures. No changes.

## 2011-11-12 ENCOUNTER — Ambulatory Visit (HOSPITAL_COMMUNITY): Payer: BC Managed Care – PPO | Attending: Internal Medicine | Admitting: Radiology

## 2011-11-12 ENCOUNTER — Encounter (HOSPITAL_COMMUNITY): Payer: Self-pay | Admitting: *Deleted

## 2011-11-12 DIAGNOSIS — I428 Other cardiomyopathies: Secondary | ICD-10-CM

## 2011-11-12 DIAGNOSIS — R0989 Other specified symptoms and signs involving the circulatory and respiratory systems: Secondary | ICD-10-CM | POA: Insufficient documentation

## 2011-11-12 DIAGNOSIS — R0789 Other chest pain: Secondary | ICD-10-CM | POA: Insufficient documentation

## 2011-11-12 DIAGNOSIS — I517 Cardiomegaly: Secondary | ICD-10-CM

## 2011-11-12 DIAGNOSIS — R5381 Other malaise: Secondary | ICD-10-CM | POA: Insufficient documentation

## 2011-11-12 DIAGNOSIS — R0609 Other forms of dyspnea: Secondary | ICD-10-CM | POA: Insufficient documentation

## 2011-11-12 MED ORDER — TECHNETIUM TC 99M-LABELED RED BLOOD CELLS IV KIT
30.0000 | PACK | Freq: Once | INTRAVENOUS | Status: AC | PRN
  Administered 2011-11-12: 30 via INTRAVENOUS

## 2011-11-12 NOTE — Progress Notes (Signed)
Patient ID: Tracy Cook, female   DOB: February 06, 1956, 56 y.o.   MRN: [id number] IV started on 2nd attempt with #22g insyte via Right antecubital by Darrick Penna for MUGA study. Patient tolerated well.

## 2011-11-12 NOTE — Progress Notes (Signed)
Muga Study  Referring Provider:  Lewayne Bunting, MD  Date of Procedure: 11/12/2011  Indication: Patient had and Stress Echocardiogram at [facility] on 2/13 and EF=45% at rest and 70% with stress;symptoms of chest pain/tightness,dyspnea and fatigue. IV started on Right Antecubital with #22g insyte on 2nd attempt by [facility]. Patient tolerated well. IV flushed easily after 5cc of blood drawn for test.  Muga Information:  The patient's red blood cells were labeled using the Ultra Tag method with 32.33mci of Technetium 76m Pertechnetate.  The images were reconstructed in the Anterior, Lateral and Left Anterior oblique views.  Impression: Normal LV function with calculated EF of 60; normal wall motion.  Tracy Cook

## 2011-11-26 ENCOUNTER — Other Ambulatory Visit (HOSPITAL_BASED_OUTPATIENT_CLINIC_OR_DEPARTMENT_OTHER): Payer: Self-pay | Admitting: Family Medicine

## 2011-11-26 DIAGNOSIS — Z1231 Encounter for screening mammogram for malignant neoplasm of breast: Secondary | ICD-10-CM

## 2011-11-28 ENCOUNTER — Ambulatory Visit (HOSPITAL_BASED_OUTPATIENT_CLINIC_OR_DEPARTMENT_OTHER)
Admission: RE | Admit: 2011-11-28 | Discharge: 2011-11-28 | Disposition: A | Discharge status: Home / Self Care | Payer: BC Managed Care – PPO | Source: Ambulatory Visit | Attending: Family Medicine | Admitting: Family Medicine

## 2011-11-28 DIAGNOSIS — Z1231 Encounter for screening mammogram for malignant neoplasm of breast: Secondary | ICD-10-CM | POA: Insufficient documentation

## 2011-12-12 ENCOUNTER — Encounter: Payer: Self-pay | Admitting: Cardiology

## 2012-01-17 ENCOUNTER — Encounter: Payer: Self-pay | Admitting: Internal Medicine

## 2012-01-17 ENCOUNTER — Ambulatory Visit (INDEPENDENT_AMBULATORY_CARE_PROVIDER_SITE_OTHER): Payer: BC Managed Care – PPO | Admitting: Internal Medicine

## 2012-01-17 VITALS — BP 108/68 | HR 78 | Temp 97.0°F | Resp 16 | Ht 69.0 in | Wt 139.0 lb

## 2012-01-17 DIAGNOSIS — Z780 Asymptomatic menopausal state: Secondary | ICD-10-CM

## 2012-01-17 DIAGNOSIS — I951 Orthostatic hypotension: Secondary | ICD-10-CM

## 2012-01-17 DIAGNOSIS — R000 Tachycardia, unspecified: Secondary | ICD-10-CM

## 2012-01-17 DIAGNOSIS — Z139 Encounter for screening, unspecified: Secondary | ICD-10-CM

## 2012-01-17 DIAGNOSIS — I498 Other specified cardiac arrhythmias: Secondary | ICD-10-CM

## 2012-01-17 NOTE — Progress Notes (Signed)
Subjective:    Patient ID: Tracy Cook, female    DOB: 05-Jul-1956, 56 y.o.   MRN: [id number]  HPI New pt here for first visit to establish primary care.  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    Former care Dr. Tresa Cook at  [place].  PMH of DJD , POTS syndrome controlled with beta blockade, chronic insomnia. And  Migraine headaches  [name] is menopausal reporting 2-3 vasomotor flushes during the night but states in the last 2-3 months "are getting better".  She flushes during the day as well and has some vaginal dryness  She reports she has never had a colonoscopy and no labs or CPE in about 5 years  There is no personal or FH of breast, GYN cancers.  Father died of MI at age 20, Father had HTN.  Mother had a CVA and suffers with Dementia.  Mother also had a post-operative DVT.  No Known Allergies Past Medical History  Diagnosis Date  . Tachycardia, unspecified   . Other malaise and fatigue   . Migraine   . POTS (postural orthostatic tachycardia syndrome)   . Persistent disorder of initiating or maintaining sleep   . Orthostatic hypotension   . Insomnia   . Menopause    History reviewed. No pertinent past surgical history. History   Social History  . Marital Status: Married    Spouse Name: N/A    Number of Children: 2  . Years of Education: N/A   Occupational History  .      Medial lab technologist   Social History Main Topics  . Smoking status: Never Smoker   . Smokeless tobacco: Never Used  . Alcohol Use: No  . Drug Use: Not on file  . Sexually Active: Yes -- Female partner(s)   Other Topics Concern  . Not on file   Social History Narrative  . No narrative on file   Family History  Problem Relation Age of Onset  . Coronary artery disease Father     Died of MI at age 31  . Asthma Father   . Stroke Mother   . Hypertension Father    Patient Active Problem List  Diagnosis  . PERSISTENT DISORDER INITIATING/MAINTAINING SLEEP  . INADEQUATE SLEEP HYGIENE  .  MIGRAINE HEADACHE  . MITRAL VALVE PROLAPSE  . POSTURAL HYPOTENSION  . HYPERMOBILITY SYNDROME  . Other Malaise and Fatigue  . UNSPECIFIED TACHYCARDIA  . Cardiomyopathy  . Chest pain  . Menopause  . POTS (postural orthostatic tachycardia syndrome)   Current Outpatient Prescriptions on File Prior to Visit  Medication Sig Dispense Refill  . amphetamine-dextroamphetamine (ADDERALL) 10 MG tablet       . propranolol (INDERAL) 20 MG tablet Take 20 mg by mouth daily.      . SUMATRIPTAN SUCCINATE PO Take 100 mg by mouth as needed. 1/2 tab -1 tab daily as needed      . zolpidem (AMBIEN) 10 MG tablet Take 10 mg by mouth as needed.            Review of Systems See HPI    Objective:   Physical Exam Physical Exam  Nursing note and vitals reviewed.  Constitutional: She is oriented to person, place, and time. She appears well-developed and well-nourished.  HENT:  Head: Normocephalic and atraumatic.  Cardiovascular: Normal rate and regular rhythm. Exam reveals no gallop and no friction rub.  No murmur heard.  Pulmonary/Chest: Breath sounds normal. She has no wheezes. She has no rales.  Neurological: She is alert and oriented to person, place, and time.  Skin: Skin is warm and dry.  Psychiatric: She has a normal mood and affect. Her behavior is normal.           Assessment & Plan:  Symptomatic menopause/vaginal dryness  I counseled in great detail the risk/benefit of HT and gave pt copy of education sheet with risks and benefits.  She does have migraine headaches and HT may increase these.  She states she is very sensitive to meds and wishes to hold off on HT for now Will get labs with TSH today   Chronic insomnia  I offered referral to Dr. Vickey Cook but she declines at this point  DJD  Migraine headaches  See above  POTS on beta blocker  Referral given For GI Dr. Loreta Ave Schedule  CPE

## 2012-03-31 ENCOUNTER — Encounter: Payer: Self-pay | Admitting: Internal Medicine

## 2012-03-31 ENCOUNTER — Ambulatory Visit (INDEPENDENT_AMBULATORY_CARE_PROVIDER_SITE_OTHER): Payer: BC Managed Care – PPO | Admitting: Internal Medicine

## 2012-03-31 VITALS — BP 110/70 | HR 86 | Temp 97.9°F | Resp 18 | Wt 137.0 lb

## 2012-03-31 DIAGNOSIS — L309 Dermatitis, unspecified: Secondary | ICD-10-CM

## 2012-03-31 DIAGNOSIS — H029 Unspecified disorder of eyelid: Secondary | ICD-10-CM

## 2012-03-31 DIAGNOSIS — R000 Tachycardia, unspecified: Secondary | ICD-10-CM

## 2012-03-31 DIAGNOSIS — N952 Postmenopausal atrophic vaginitis: Secondary | ICD-10-CM

## 2012-03-31 DIAGNOSIS — L259 Unspecified contact dermatitis, unspecified cause: Secondary | ICD-10-CM

## 2012-03-31 DIAGNOSIS — G43909 Migraine, unspecified, not intractable, without status migrainosus: Secondary | ICD-10-CM

## 2012-03-31 MED ORDER — PROPRANOLOL HCL 20 MG PO TABS: ORAL_TABLET | ORAL | Status: DC

## 2012-03-31 MED ORDER — ESTRADIOL 10 MCG VA TABS: 1.0000 | ORAL_TABLET | VAGINAL | Status: DC

## 2012-03-31 MED ORDER — HYDROCORTISONE 2.5 % EX CREA: TOPICAL_CREAM | Freq: Two times a day (BID) | CUTANEOUS | Status: AC

## 2012-03-31 MED ORDER — SUMATRIPTAN SUCCINATE 100 MG PO TABS: ORAL_TABLET | ORAL | Status: DC

## 2012-03-31 NOTE — Progress Notes (Signed)
Subjective:    Patient ID: Tracy Cook, female    DOB: [birth year], 56 y.o.   MRN: [id number]  HPI  Tracy Cook is here for acute visit.  She has a small lesion on her R eyelid that has been there for many years.   Non painful does not affect vision  She also has red rash on L side of nares  Migraine doing OK.  Had migraine last night relieved by sumatriptan  Still have vaginal dryness despite Luvena .  She would like to try vaginal estrogen  Review of Systems See HPI    Objective:   Physical Exam No Known Allergies Past Medical History  Diagnosis Date  . Tachycardia, unspecified   . Other malaise and fatigue   . Migraine   . POTS (postural orthostatic tachycardia syndrome)   . Persistent disorder of initiating or maintaining sleep   . Orthostatic hypotension   . Insomnia   . Menopause    No past surgical history on file. History   Social History  . Marital Status: Married    Spouse Name: N/A    Number of Children: 2  . Years of Education: N/A   Occupational History  .      Medial lab technologist   Social History Main Topics  . Smoking status: Never Smoker   . Smokeless tobacco: Never Used  . Alcohol Use: No  . Drug Use: Not on file  . Sexually Active: Yes -- Female partner(s)   Other Topics Concern  . Not on file   Social History Narrative  . No narrative on file   Family History  Problem Relation Age of Onset  . Coronary artery disease Father     Died of MI at age 11  . Asthma Father   . Stroke Mother   . Hypertension Father    Patient Active Problem List  Diagnosis  . PERSISTENT DISORDER INITIATING/MAINTAINING SLEEP  . INADEQUATE SLEEP HYGIENE  . MIGRAINE HEADACHE  . MITRAL VALVE PROLAPSE  . POSTURAL HYPOTENSION  . HYPERMOBILITY SYNDROME  . Other Malaise and Fatigue  . UNSPECIFIED TACHYCARDIA  . Cardiomyopathy  . Chest pain  . Menopause  . POTS (postural orthostatic tachycardia syndrome)  . Dermatitis  . Postmenopausal atrophic  vaginitis   Current Outpatient Prescriptions on File Prior to Visit  Medication Sig Dispense Refill  . amphetamine-dextroamphetamine (ADDERALL) 10 MG tablet       . zolpidem (AMBIEN) 10 MG tablet Take 10 mg by mouth as needed.       Marland Kitchen DISCONTD: propranolol (INDERAL) 20 MG tablet Take 20 mg by mouth daily.      Marland Kitchen DISCONTD: SUMATRIPTAN SUCCINATE PO Take 100 mg by mouth as needed. 1/2 tab -1 tab daily as needed        Physical Exam  Nursing note and vitals reviewed.  Constitutional: She is oriented to person, place, and time. She appears well-developed and well-nourished.  HENT:  R eyelid very small flesh colored nodule on lid. Head: Normocephalic and atraumatic.  Cardiovascular: Normal rate and regular rhythm. Exam reveals no gallop and no friction rub.  No murmur heard.  Pulmonary/Chest: Breath sounds normal. She has no wheezes. She has no rales.  Neurological: She is alert and oriented to person, place, and time.  Skin: Skin is warm and dry. She has reddened slightl scaly rash at L nares.   Psychiatric: She has a normal mood and affect. Her behavior is normal.  Assessment & Plan:  Eyelid lesion  Advised to see opthalmologist .  Pt states she will call [facility] for appt.  She did not want me to make opthalmology referral from me.    Dermatitis nasal  willl give HC cream 2.5% bid  Migraine contine Sumatriptan will re-order today  Tachycardia: re-order Propranolol  Atrophic vaginitis: Ok to initiate Vagifem 10  Mcg twice weekly  Keep CPE appt with me

## 2012-03-31 NOTE — Patient Instructions (Addendum)
Pt to call [facility] to make her own appt.    Call office if rash does not improve  See me at [facility]

## 2012-04-17 ENCOUNTER — Encounter: Payer: Self-pay | Admitting: Internal Medicine

## 2012-04-17 DIAGNOSIS — K635 Polyp of colon: Secondary | ICD-10-CM | POA: Insufficient documentation

## 2012-05-01 LAB — COMPREHENSIVE METABOLIC PANEL
ALT: 24 U/L (ref 0–35)
AST: 25 U/L (ref 0–37)
Albumin: 4.6 g/dL (ref 3.5–5.2)
Alkaline Phosphatase: 61 U/L (ref 39–117)
BUN: 14 mg/dL (ref 6–23)
CO2: 31 mEq/L (ref 19–32)
Calcium: 9.9 mg/dL (ref 8.4–10.5)
Chloride: 101 mEq/L (ref 96–112)
Creat: 0.73 mg/dL (ref 0.50–1.10)
Glucose, Bld: 78 mg/dL (ref 70–99)
Potassium: 4.2 mEq/L (ref 3.5–5.3)
Sodium: 139 mEq/L (ref 135–145)
Total Bilirubin: 0.3 mg/dL (ref 0.3–1.2)
Total Protein: 7.3 g/dL (ref 6.0–8.3)

## 2012-05-01 LAB — CBC WITH DIFFERENTIAL/PLATELET
Basophils Absolute: 0.1 10*3/uL (ref 0.0–0.1)
Basophils Relative: 1 % (ref 0–1)
Eosinophils Absolute: 0.1 10*3/uL (ref 0.0–0.7)
Eosinophils Relative: 1 % (ref 0–5)
HCT: 38.9 % (ref 36.0–46.0)
Hemoglobin: 13.7 g/dL (ref 12.0–15.0)
Lymphocytes Relative: 36 % (ref 12–46)
Lymphs Abs: 2.4 10*3/uL (ref 0.7–4.0)
MCH: 30.3 pg (ref 26.0–34.0)
MCHC: 35.2 g/dL (ref 30.0–36.0)
MCV: 86.1 fL (ref 78.0–100.0)
Monocytes Absolute: 0.7 10*3/uL (ref 0.1–1.0)
Monocytes Relative: 10 % (ref 3–12)
Neutro Abs: 3.5 10*3/uL (ref 1.7–7.7)
Neutrophils Relative %: 52 % (ref 43–77)
Platelets: 352 10*3/uL (ref 150–400)
RBC: 4.52 MIL/uL (ref 3.87–5.11)
RDW: 13.1 % (ref 11.5–15.5)
WBC: 6.7 10*3/uL (ref 4.0–10.5)

## 2012-05-01 LAB — LIPID PANEL
Cholesterol: 163 mg/dL (ref 0–200)
HDL: 58 mg/dL (ref >39)
LDL Cholesterol: 88 mg/dL (ref 0–99)
Total CHOL/HDL Ratio: 2.8 Ratio
Triglycerides: 86 mg/dL (ref <150)
VLDL: 17 mg/dL (ref 0–40)

## 2012-05-01 LAB — TSH: TSH: 0.845 u[IU]/mL (ref 0.350–4.500)

## 2012-05-01 LAB — VITAMIN D 25 HYDROXY (VIT D DEFICIENCY, FRACTURES): Vit D, 25-Hydroxy: 48 ng/mL (ref 30–89)

## 2012-05-07 ENCOUNTER — Encounter: Payer: Self-pay | Admitting: *Deleted

## 2012-05-08 ENCOUNTER — Encounter: Payer: Self-pay | Admitting: Internal Medicine

## 2012-05-08 ENCOUNTER — Ambulatory Visit (INDEPENDENT_AMBULATORY_CARE_PROVIDER_SITE_OTHER): Payer: BC Managed Care – PPO | Admitting: Internal Medicine

## 2012-05-08 VITALS — BP 98/69 | HR 93 | Temp 97.0°F | Resp 18 | Wt 139.0 lb

## 2012-05-08 DIAGNOSIS — Z0000 Encounter for general adult medical examination without abnormal findings: Secondary | ICD-10-CM

## 2012-05-08 DIAGNOSIS — Z1151 Encounter for screening for human papillomavirus (HPV): Secondary | ICD-10-CM

## 2012-05-08 DIAGNOSIS — Z124 Encounter for screening for malignant neoplasm of cervix: Secondary | ICD-10-CM

## 2012-05-08 MED ORDER — PROGESTERONE MICRONIZED 100 MG PO CAPS: ORAL_CAPSULE | ORAL | Status: DC

## 2012-05-08 MED ORDER — ESTRADIOL 0.05 MG/24HR TD PTTW: MEDICATED_PATCH | TRANSDERMAL | Status: DC

## 2012-05-08 NOTE — Patient Instructions (Addendum)
See me in 3 months  Take meds as prescribed 

## 2012-05-08 NOTE — Progress Notes (Signed)
Subjective:    Patient ID: Tracy Cook, female    DOB: [birth year], 56 y.o.   MRN: [id number]  HPI  Tracy Cook is here for comprehensive eval  She continues to have day and nightly hot flushes that keep her awake and affect QOL.  Sh eis using vagifem tablets for the past two months  And notices less vaginal dryness  She did see Dr. Loreta Ave  And reports she had 4 polyps..  Allergies  Allergen Reactions  . Codeine Nausea And Vomiting   Past Medical History  Diagnosis Date  . Tachycardia, unspecified   . Other malaise and fatigue   . Migraine   . POTS (postural orthostatic tachycardia syndrome)   . Persistent disorder of initiating or maintaining sleep   . Orthostatic hypotension   . Insomnia   . Menopause    No past surgical history on file. History   Social History  . Marital Status: Married    Spouse Name: N/A    Number of Children: 2  . Years of Education: N/A   Occupational History  .      Medial lab technologist   Social History Main Topics  . Smoking status: Never Smoker   . Smokeless tobacco: Never Used  . Alcohol Use: No  . Drug Use: Not on file  . Sexually Active: Yes -- Female partner(s)   Other Topics Concern  . Not on file   Social History Narrative  . No narrative on file   Family History  Problem Relation Age of Onset  . Coronary artery disease Father     Died of MI at age 50  . Asthma Father   . Stroke Mother   . Hypertension Father    Patient Active Problem List  Diagnosis  . PERSISTENT DISORDER INITIATING/MAINTAINING SLEEP  . INADEQUATE SLEEP HYGIENE  . MIGRAINE HEADACHE  . MITRAL VALVE PROLAPSE  . POSTURAL HYPOTENSION  . HYPERMOBILITY SYNDROME  . Other Malaise and Fatigue  . UNSPECIFIED TACHYCARDIA  . Cardiomyopathy  . Chest pain  . Menopause  . POTS (postural orthostatic tachycardia syndrome)  . Dermatitis  . Postmenopausal atrophic vaginitis  . Colon polyps   Current Outpatient Prescriptions on File Prior to Visit    Medication Sig Dispense Refill  . amphetamine-dextroamphetamine (ADDERALL) 10 MG tablet       . Estradiol (VAGIFEM) 10 MCG TABS Place 1 tablet (10 mcg total) vaginally 2 (two) times a week.  8 tablet  2  . hydrocortisone 2.5 % cream Apply topically 2 (two) times daily.  30 g  0  . propranolol (INDERAL) 20 MG tablet Take one tablet bid  60 tablet  3  . SUMAtriptan (IMITREX) 100 MG tablet Take one tablet at onset of headache.  May repeat one time only in 2 hours  10 tablet  3  . zolpidem (AMBIEN) 10 MG tablet Take 10 mg by mouth as needed.           Review of Systems  All other systems reviewed and are negative.       Objective:   Physical Exam Physical Exam  Vital signs and nursing note reviewed  Constitutional: She is oriented to person, place, and time. She appears well-developed and well-nourished. She is cooperative.  HENT:  Head: Normocephalic and atraumatic.  Right Ear: Tympanic membrane normal.  Left Ear: Tympanic membrane normal.  Nose: Nose normal.  Mouth/Throat: Oropharynx is clear and moist and mucous membranes are normal. No oropharyngeal exudate or posterior oropharyngeal erythema.  Eyes: Conjunctivae and EOM are normal. Pupils are equal, round, and reactive to light.  Neck: Neck supple. No JVD present. Carotid bruit is not present. No mass and no thyromegaly present.  Cardiovascular: Regular rhythm, normal heart sounds, intact distal pulses and normal pulses.  Exam reveals no gallop and no friction rub.   No murmur heard. Pulses:      Dorsalis pedis pulses are 2+ on the right side, and 2+ on the left side.  Pulmonary/Chest: Breath sounds normal. She has no wheezes. She has no rhonchi. She has no rales. Right breast exhibits no mass, no nipple discharge and no skin change. Left breast exhibits no mass, no nipple discharge and no skin change.  Abdominal: Soft. Bowel sounds are normal. She exhibits no distension and no mass. There is no hepatosplenomegaly. There is no  tenderness. There is no CVA tenderness.  Genitourinary: Rectum noraml, vagina normal and uterus normal. Rectal exam deferred due to recent colonoscopy. No labial fusion. There is no lesion on the right labia. There is no lesion on the left labia. Cervix exhibits no motion tenderness. Right adnexum displays no mass, no tenderness and no fullness. Left adnexum displays no mass, no tenderness and no fullness. No erythema around the vagina.  Musculoskeletal:       No active synovitis to any joint.    Lymphadenopathy:       Right cervical: No superficial cervical adenopathy present.      Left cervical: No superficial cervical adenopathy present.       Right axillary: No pectoral and no lateral adenopathy present.       Left axillary: No pectoral and no lateral adenopathy present.      Right: No inguinal adenopathy present.       Left: No inguinal adenopathy present.  Neurological: She is alert and oriented to person, place, and time. She has normal strength and normal reflexes. No cranial nerve deficit or sensory deficit. She displays a negative Romberg sign. Coordination and gait normal.  Skin: Skin is warm and dry. No abrasion, no bruising, no ecchymosis and no rash noted. No cyanosis. Nails show no clubbing.  Psychiatric: She has a normal mood and affect. Her speech is normal and behavior is normal.          Assessment & Plan:   Health Maintenance  Will get flu vaccine next week.  Pt delcines all other vaccines    Menopausal hot flushes  I reveiwed and pt signed risk/benefit sheet  OK to stop vagifem and will inititate Minivelle .05 mg chane=ge twice a week and Prometrium 100 mg daily see me in 3 months  POTS/MVP  Continue beta blocker  Recent MUGA  History of migraine headache  Advised if any increase in migraine to stop med and see me in office        Assessment & Plan:

## 2012-05-09 ENCOUNTER — Encounter: Payer: Self-pay | Admitting: *Deleted

## 2012-05-21 ENCOUNTER — Encounter: Payer: Self-pay | Admitting: *Deleted

## 2012-05-21 ENCOUNTER — Telehealth: Payer: Self-pay | Admitting: *Deleted

## 2012-05-21 NOTE — Telephone Encounter (Signed)
Message copied by Mathews Robinsons on Wed May 21, 2012  8:49 AM ------      Message from: Raechel Chute D      Created: Wed May 14, 2012 12:52 PM       Send pap letter that test was negative.  Repeat in two-three years

## 2012-05-21 NOTE — Telephone Encounter (Signed)
-  pap letter and labs mailed to pt home address

## 2012-06-23 ENCOUNTER — Ambulatory Visit (INDEPENDENT_AMBULATORY_CARE_PROVIDER_SITE_OTHER): Payer: BC Managed Care – PPO | Admitting: Family Medicine

## 2012-06-23 VITALS — BP 117/82 | Ht 70.0 in | Wt 140.0 lb

## 2012-06-23 DIAGNOSIS — M79676 Pain in unspecified toe(s): Secondary | ICD-10-CM

## 2012-06-23 DIAGNOSIS — M79609 Pain in unspecified limb: Secondary | ICD-10-CM

## 2012-06-23 NOTE — Patient Instructions (Addendum)
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 Buddy taping to the 2nd toe and a 1st ray post are things that tend to help with this. Take tylenol 500mg  1-2 tabs three times a day for pain. Ibuprofen 600mg  three times a day OR Aleve 1-2 tabs twice a day with food Glucosamine sulfate 750mg  twice a day is a supplement that may help moderate to severe arthritis. Capsaicin topically up to four times a day may also help with pain. Cortisone injections are an option. Shoe inserts with good arch support may be helpful. Heat or ice 15 minutes at a time 3-4 times a day as needed to help with pain. Consider custom orthotics vs I can show you how to order these from [facility] if you do well with them. Follow up with me in 1 month.

## 2012-06-25 ENCOUNTER — Encounter: Payer: Self-pay | Admitting: Family Medicine

## 2012-06-25 DIAGNOSIS — M79676 Pain in unspecified toe(s): Secondary | ICD-10-CM | POA: Insufficient documentation

## 2012-06-25 NOTE — Progress Notes (Signed)
Subjective:    Patient ID: Tracy Cook, female    DOB: 1955/12/22, 56 y.o.   MRN: [id number]  PCP: Dr. Constance Goltz  HPI 56 yo F here for left great toe pain.  Patient denies known injury. She walks about 3 miles a night. States for past 3 months has had pain at 1st MTP of left foot worse after walking. No redness, warmth, h/o gout. No limping. Improves with traction. No prior issues with this toe.  Past Medical History  Diagnosis Date  . Tachycardia, unspecified   . Other malaise and fatigue   . Migraine   . POTS (postural orthostatic tachycardia syndrome)   . Persistent disorder of initiating or maintaining sleep   . Orthostatic hypotension   . Insomnia   . Menopause     Current Outpatient Prescriptions on File Prior to Visit  Medication Sig Dispense Refill  . amphetamine-dextroamphetamine (ADDERALL) 10 MG tablet       . propranolol (INDERAL) 20 MG tablet Take one tablet bid  60 tablet  3  . SUMAtriptan (IMITREX) 100 MG tablet Take one tablet at onset of headache.  May repeat one time only in 2 hours  10 tablet  3  . zolpidem (AMBIEN) 10 MG tablet Take 10 mg by mouth as needed.       Marland Kitchen estradiol (MINIVELLE) 0.05 MG/24HR Change patch twice a week  8 patch  3  . hydrocortisone 2.5 % cream Apply topically 2 (two) times daily.  30 g  0  . progesterone (PROMETRIUM) 100 MG capsule Take one daily  30 capsule  3    History reviewed. No pertinent past surgical history.  Allergies  Allergen Reactions  . Codeine Nausea And Vomiting    History   Social History  . Marital Status: Married    Spouse Name: N/A    Number of Children: 2  . Years of Education: N/A   Occupational History  .      Medial lab technologist   Social History Main Topics  . Smoking status: Never Smoker   . Smokeless tobacco: Never Used  . Alcohol Use: No  . Drug Use: Not on file  . Sexually Active: Yes -- Female partner(s)   Other Topics Concern  . Not on file   Social History Narrative   . No narrative on file    Family History  Problem Relation Age of Onset  . Coronary artery disease Father     Died of MI at age 38  . Asthma Father   . Hypertension Father   . Sudden death Father   . Heart attack Father   . Stroke Mother   . Diabetes Neg Hx   . Hyperlipidemia Neg Hx     BP 117/82  Ht 5\' 10"  (1.778 m)  Wt 140 lb (63.504 kg)  BMI 20.09 kg/m2  Review of Systems See HPI above.    Objective:   Physical Exam Gen: NAD  L foot/ankle: No gross deformity, swelling, ecchymoses, warmth. Mild limitation of dorsiflexion but fairly good motion of 1st MTP. Mild TTP circumferentially at 1st MTP. Negative ant drawer and talar tilt.   Thompsons test negative. NV intact distally.    Assessment & Plan:  1. Left 1st MTP pain - no evidence of gout based on exam, history.  Consistent with either mild DJD or synovitis of 1st MTP.  Sports insoles with first ray post fashioned for patient.  Discussed buddy taping when exercising.  Tylenol, nsaids as needed for  pain.  Capsaicin, consider glucosamine.  Consider injection, imaging, custom orthotics if not improving.

## 2012-06-25 NOTE — Assessment & Plan Note (Signed)
Left 1st MTP pain - no evidence of gout based on exam, history.  Consistent with either mild DJD or synovitis of 1st MTP.  Sports insoles with first ray post fashioned for patient.  Discussed buddy taping when exercising.  Tylenol, nsaids as needed for pain.  Capsaicin, consider glucosamine.  Consider injection, imaging, custom orthotics if not improving.

## 2012-06-30 ENCOUNTER — Ambulatory Visit (INDEPENDENT_AMBULATORY_CARE_PROVIDER_SITE_OTHER): Payer: BC Managed Care – PPO | Admitting: Internal Medicine

## 2012-06-30 ENCOUNTER — Encounter: Payer: Self-pay | Admitting: Internal Medicine

## 2012-06-30 VITALS — BP 112/74 | HR 83 | Temp 97.1°F | Resp 16 | Ht 70.0 in | Wt 140.0 lb

## 2012-06-30 DIAGNOSIS — N951 Menopausal and female climacteric states: Secondary | ICD-10-CM | POA: Insufficient documentation

## 2012-06-30 DIAGNOSIS — H109 Unspecified conjunctivitis: Secondary | ICD-10-CM | POA: Insufficient documentation

## 2012-06-30 DIAGNOSIS — Z20828 Contact with and (suspected) exposure to other viral communicable diseases: Secondary | ICD-10-CM | POA: Insufficient documentation

## 2012-06-30 MED ORDER — CIPROFLOXACIN HCL 0.3 % OP SOLN: OPHTHALMIC | Status: DC

## 2012-06-30 MED ORDER — OSELTAMIVIR PHOSPHATE 75 MG PO CAPS: 75.0000 mg | ORAL_CAPSULE | Freq: Every day | ORAL | Status: DC

## 2012-06-30 MED ORDER — ESTRADIOL 10 MCG VA TABS: ORAL_TABLET | VAGINAL | Status: DC

## 2012-06-30 NOTE — Patient Instructions (Addendum)
See me as needed 

## 2012-06-30 NOTE — Progress Notes (Signed)
Subjective:    Patient ID: Tracy Cook, female    DOB: September 01, 1955, 56 y.o.   MRN: [id number]  HPI  Tracy Cook is here with acute visit.  She has crusty drainage from both eyes has tried salt water rinses but not helping.  No change in vision or eye pain.  Her husband has influenza and she would like to try to prevent it as she will be going to [place] soon for her grandbabies delivery  She did not like the progesterone so stopped both minivelle and progesterone.   She would like to take the vagifem vaginally.    Allergies  Allergen Reactions  . Codeine Nausea And Vomiting   Past Medical History  Diagnosis Date  . Tachycardia, unspecified   . Other malaise and fatigue   . Migraine   . POTS (postural orthostatic tachycardia syndrome)   . Persistent disorder of initiating or maintaining sleep   . Orthostatic hypotension   . Insomnia   . Menopause    History reviewed. No pertinent past surgical history. History   Social History  . Marital Status: Married    Spouse Name: N/A    Number of Children: 2  . Years of Education: N/A   Occupational History  .      Medial lab technologist   Social History Main Topics  . Smoking status: Never Smoker   . Smokeless tobacco: Never Used  . Alcohol Use: No  . Drug Use: Not on file  . Sexually Active: Yes -- Female partner(s)   Other Topics Concern  . Not on file   Social History Narrative  . No narrative on file   Family History  Problem Relation Age of Onset  . Coronary artery disease Father     Died of MI at age 59  . Asthma Father   . Hypertension Father   . Sudden death Father   . Heart attack Father   . Stroke Mother   . Diabetes Neg Hx   . Hyperlipidemia Neg Hx    Patient Active Problem List  Diagnosis  . PERSISTENT DISORDER INITIATING/MAINTAINING SLEEP  . INADEQUATE SLEEP HYGIENE  . MIGRAINE HEADACHE  . MITRAL VALVE PROLAPSE  . POSTURAL HYPOTENSION  . HYPERMOBILITY SYNDROME  . Other Malaise and Fatigue  .  UNSPECIFIED TACHYCARDIA  . Cardiomyopathy  . Chest pain  . Menopause  . POTS (postural orthostatic tachycardia syndrome)  . Dermatitis  . Postmenopausal atrophic vaginitis  . Colon polyps  . Toe pain   Current Outpatient Prescriptions on File Prior to Visit  Medication Sig Dispense Refill  . amphetamine-dextroamphetamine (ADDERALL) 10 MG tablet       . propranolol (INDERAL) 20 MG tablet Take one tablet bid  60 tablet  3  . SUMAtriptan (IMITREX) 100 MG tablet Take one tablet at onset of headache.  May repeat one time only in 2 hours  10 tablet  3  . zolpidem (AMBIEN) 10 MG tablet Take 10 mg by mouth as needed.       Marland Kitchen estradiol (MINIVELLE) 0.05 MG/24HR Change patch twice a week  8 patch  3  . hydrocortisone 2.5 % cream Apply topically 2 (two) times daily.  30 g  0  . progesterone (PROMETRIUM) 100 MG capsule Take one daily  30 capsule  3     Review of Systems See HPI    Objective:   Physical Exam Physical Exam  Nursing note and vitals reviewed.  Constitutional: She is oriented to person, place, and  time. She appears well-developed and well-nourished.  HENT:  No drainage on  either lids.   Bilateral mild erythema both eyes Head: Normocephalic and atraumatic.  Cardiovascular: Normal rate and regular rhythm. Exam reveals no gallop and no friction rub.  No murmur heard.  Pulmonary/Chest: Breath sounds normal. She has no wheezes. She has no rales.  Neurological: She is alert and oriented to person, place, and time.  Skin: Skin is warm and dry.  Psychiatric: She has a normal mood and affect. Her behavior is normal.              Assessment & Plan:  Conjunctivitis:  Will give Ciloxan gtts both eyes  Influenza exposure  Rx for Tamiflu 75 mg daily for 10 days  Vaginal dryness   Stop HT and ok for vaginal estrogen 2 times per week. Vagifem 10 mcg

## 2012-07-24 ENCOUNTER — Ambulatory Visit: Payer: BC Managed Care – PPO | Admitting: Family Medicine

## 2012-07-25 ENCOUNTER — Other Ambulatory Visit: Payer: Self-pay | Admitting: *Deleted

## 2012-07-25 MED ORDER — SUMATRIPTAN SUCCINATE 100 MG PO TABS: ORAL_TABLET | ORAL | Status: DC

## 2012-07-25 NOTE — Telephone Encounter (Signed)
Refill request

## 2012-08-07 ENCOUNTER — Ambulatory Visit: Payer: BC Managed Care – PPO | Admitting: Internal Medicine

## 2012-08-12 ENCOUNTER — Other Ambulatory Visit: Payer: Self-pay | Admitting: *Deleted

## 2012-08-12 NOTE — Telephone Encounter (Signed)
Refill request

## 2012-08-13 MED ORDER — SUMATRIPTAN SUCCINATE 100 MG PO TABS: ORAL_TABLET | ORAL | Status: DC

## 2012-12-18 ENCOUNTER — Other Ambulatory Visit: Payer: Self-pay | Admitting: Internal Medicine

## 2012-12-18 NOTE — Telephone Encounter (Signed)
Refill request

## 2013-03-19 ENCOUNTER — Encounter: Payer: Self-pay | Admitting: Internal Medicine

## 2013-03-19 DIAGNOSIS — M47816 Spondylosis without myelopathy or radiculopathy, lumbar region: Secondary | ICD-10-CM | POA: Insufficient documentation

## 2013-04-23 ENCOUNTER — Encounter: Payer: Self-pay | Admitting: Internal Medicine

## 2013-04-23 ENCOUNTER — Ambulatory Visit (INDEPENDENT_AMBULATORY_CARE_PROVIDER_SITE_OTHER): Payer: BC Managed Care – PPO | Admitting: Internal Medicine

## 2013-04-23 ENCOUNTER — Encounter: Payer: Self-pay | Admitting: *Deleted

## 2013-04-23 VITALS — BP 112/75 | HR 104 | Temp 97.8°F | Resp 18 | Wt 133.0 lb

## 2013-04-23 DIAGNOSIS — Z23 Encounter for immunization: Secondary | ICD-10-CM

## 2013-04-23 DIAGNOSIS — S139XXA Sprain of joints and ligaments of unspecified parts of neck, initial encounter: Secondary | ICD-10-CM

## 2013-04-23 DIAGNOSIS — S161XXA Strain of muscle, fascia and tendon at neck level, initial encounter: Secondary | ICD-10-CM

## 2013-04-23 MED ORDER — NABUMETONE 500 MG PO TABS: 500.0000 mg | ORAL_TABLET | Freq: Two times a day (BID) | ORAL | Status: DC

## 2013-04-23 MED ORDER — LORAZEPAM 0.5 MG PO TABS: ORAL_TABLET | ORAL | Status: DC

## 2013-04-23 MED ORDER — PREDNISONE 20 MG PO TABS: ORAL_TABLET | ORAL | Status: DC

## 2013-04-23 NOTE — Progress Notes (Signed)
Subjective:    Patient ID: Tracy Cook, female    DOB: 12-05-1955, 57 y.o.   MRN: [id number]  HPI  Jaydyn is here for acute visit.  She states that 4 days ago she turned her neck in bed and it has been hurting ever since.  It is better today but she is worried that she won't be well for her daughters wedding  4 weeks from now.  She has been using ibuprofen with improvement.  No paresthesia no muslcle weakness down either arm.   She also would like a few anxiety pills for the wedding at [facility]  Allergies  Allergen Reactions  . Codeine Nausea And Vomiting   Past Medical History  Diagnosis Date  . Tachycardia, unspecified   . Other malaise and fatigue   . Migraine   . POTS (postural orthostatic tachycardia syndrome)   . Persistent disorder of initiating or maintaining sleep   . Orthostatic hypotension   . Insomnia   . Menopause    History reviewed. No pertinent past surgical history. History   Social History  . Marital Status: Married    Spouse Name: N/A    Number of Children: 2  . Years of Education: N/A   Occupational History  .      Medial lab technologist   Social History Main Topics  . Smoking status: Never Smoker   . Smokeless tobacco: Never Used  . Alcohol Use: No  . Drug Use: Not on file  . Sexual Activity: Yes    Partners: Male   Other Topics Concern  . Not on file   Social History Narrative  . No narrative on file   Family History  Problem Relation Age of Onset  . Coronary artery disease Father     Died of MI at age 46  . Asthma Father   . Hypertension Father   . Sudden death Father   . Heart attack Father   . Stroke Mother   . Diabetes Neg Hx   . Hyperlipidemia Neg Hx    Patient Active Problem List   Diagnosis Date Noted  . Lumbar spondylosis 03/19/2013  . Conjunctivitis 06/30/2012  . Vaginal dryness, menopausal 06/30/2012  . Exposure to influenza 06/30/2012  . Toe pain 06/25/2012  . Colon polyps 04/17/2012  . Dermatitis  03/31/2012  . Postmenopausal atrophic vaginitis 03/31/2012  . Menopause 01/17/2012  . POTS (postural orthostatic tachycardia syndrome) 01/17/2012  . Cardiomyopathy 11/07/2011  . Chest pain 11/07/2011  . UNSPECIFIED TACHYCARDIA 01/12/2009  . MIGRAINE HEADACHE 12/15/2008  . INADEQUATE SLEEP HYGIENE 04/22/2008  . PERSISTENT DISORDER INITIATING/MAINTAINING SLEEP 03/11/2008  . Other Malaise and Fatigue 11/27/2007  . MITRAL VALVE PROLAPSE 10/30/2007  . POSTURAL HYPOTENSION 10/30/2007  . HYPERMOBILITY SYNDROME 10/30/2007   Current Outpatient Prescriptions on File Prior to Visit  Medication Sig Dispense Refill  . amphetamine-dextroamphetamine (ADDERALL) 10 MG tablet       . Estradiol (VAGIFEM) 10 MCG TABS Place vaginally two times per week  8 tablet  3  . SUMAtriptan (IMITREX) 100 MG tablet Take one tablet at onset of headache.  May repeat one time only in 2 hours  10 tablet  3  . zolpidem (AMBIEN) 10 MG tablet Take 10 mg by mouth as needed.       . propranolol (INDERAL) 20 MG tablet Take one tablet bid  60 tablet  3   No current facility-administered medications on file prior to visit.    .    Review of Systems  See HPi    Objective:   Physical Exam  Physical Exam  Nursing note and vitals reviewed.  Constitutional: She is oriented to person, place, and time. She appears well-developed and well-nourished.  HENT:  Head: Normocephalic and atraumatic.  Neck point tender along muslce belly of Left side  No pain with movement Cardiovascular: Normal rate and regular rhythm. Exam reveals no gallop and no friction rub.  No murmur heard.  Pulmonary/Chest: Breath sounds normal. She has no wheezes. She has no rales.  Neurological: She is alert and oriented to person, place, and time.  Skin: Skin is warm and dry.  Psychiatric: She has a normal mood and affect. Her behavior is normal.        Assessment & Plan:  Neck pain likely M/S    Low clinical suspicion for HNP  Will give RElafen  bid and short course of prednisone.  Flexeril she has at [facility] and makes her too groggy  Situational anxiety  Ok for 0.5 mg ativan 1/2 tab   Flu vaccine today

## 2013-04-23 NOTE — Patient Instructions (Addendum)
Take Relafen  Twice a day for one week  Take Prednisone two tablets daily for 3 days then one tablet daily for 3 days then stop  You received an influenza vaccine today.

## 2013-05-01 ENCOUNTER — Other Ambulatory Visit: Payer: Self-pay | Admitting: *Deleted

## 2013-05-01 NOTE — Telephone Encounter (Signed)
Refill request see telephone note

## 2013-05-01 NOTE — Telephone Encounter (Signed)
Pt requesting another RX of prednisone states that she is better but still has pain advised pt that Dr Constance Goltz will most likely want to see her if she is not better. Pt states that has to go out of town in a couple of weeks for her daughters wedding.

## 2013-05-05 ENCOUNTER — Telehealth: Payer: Self-pay | Admitting: *Deleted

## 2013-05-05 NOTE — Telephone Encounter (Signed)
Tracy Cook  Call [facility] an tell her that another prednisone course is too much prednisone.    If she wishes she can have ultracet  37.5 mg   If she is not allergic and can have #6 to get her through the wedding  She can have OV with Dr. Mariel Aloe office if she wishes

## 2013-05-05 NOTE — Telephone Encounter (Signed)
LVM message to return call regarding medication management

## 2013-06-15 ENCOUNTER — Other Ambulatory Visit: Payer: Self-pay | Admitting: Internal Medicine

## 2013-07-20 ENCOUNTER — Other Ambulatory Visit: Payer: Self-pay | Admitting: *Deleted

## 2013-07-20 ENCOUNTER — Telehealth: Payer: Self-pay | Admitting: *Deleted

## 2013-07-20 MED ORDER — PROPRANOLOL HCL 20 MG PO TABS: ORAL_TABLET | ORAL | Status: DC

## 2013-07-20 NOTE — Telephone Encounter (Signed)
Tracy Cook called requesting a refill of propranolol (INDERAL) 20 MG tablet.  Please call into [facility].

## 2013-07-20 NOTE — Telephone Encounter (Signed)
Refill request will notify pt that labs are needed

## 2013-08-06 ENCOUNTER — Other Ambulatory Visit: Payer: Self-pay | Admitting: Physical Medicine and Rehabilitation

## 2013-08-06 DIAGNOSIS — I6529 Occlusion and stenosis of unspecified carotid artery: Secondary | ICD-10-CM

## 2013-08-09 ENCOUNTER — Ambulatory Visit
Admission: RE | Admit: 2013-08-09 | Discharge: 2013-08-09 | Disposition: A | Discharge status: Home / Self Care | Payer: BC Managed Care – PPO | Source: Ambulatory Visit | Attending: Physical Medicine and Rehabilitation | Admitting: Physical Medicine and Rehabilitation

## 2013-08-09 ENCOUNTER — Other Ambulatory Visit: Payer: BC Managed Care – PPO

## 2013-08-09 DIAGNOSIS — I6529 Occlusion and stenosis of unspecified carotid artery: Secondary | ICD-10-CM

## 2013-08-09 MED ORDER — GADOBENATE DIMEGLUMINE 529 MG/ML IV SOLN
12.0000 mL | Freq: Once | INTRAVENOUS | Status: AC | PRN
  Administered 2013-08-09: 12 mL via INTRAVENOUS

## 2013-08-13 ENCOUNTER — Other Ambulatory Visit: Payer: Self-pay | Admitting: Internal Medicine

## 2013-08-13 NOTE — Telephone Encounter (Signed)
Needs refill on Imitrex

## 2013-11-18 ENCOUNTER — Encounter: Payer: Self-pay | Admitting: Internal Medicine

## 2013-11-18 ENCOUNTER — Ambulatory Visit (INDEPENDENT_AMBULATORY_CARE_PROVIDER_SITE_OTHER): Payer: BC Managed Care – PPO | Admitting: Internal Medicine

## 2013-11-18 VITALS — BP 112/83 | HR 92 | Temp 98.7°F | Resp 16 | Wt 130.0 lb

## 2013-11-18 DIAGNOSIS — R05 Cough: Secondary | ICD-10-CM

## 2013-11-18 DIAGNOSIS — J029 Acute pharyngitis, unspecified: Secondary | ICD-10-CM

## 2013-11-18 DIAGNOSIS — J209 Acute bronchitis, unspecified: Secondary | ICD-10-CM

## 2013-11-18 DIAGNOSIS — R059 Cough, unspecified: Secondary | ICD-10-CM

## 2013-11-18 MED ORDER — AZITHROMYCIN 250 MG PO TABS: ORAL_TABLET | ORAL | Status: DC

## 2013-11-18 MED ORDER — BENZONATATE 100 MG PO CAPS: 100.0000 mg | ORAL_CAPSULE | Freq: Two times a day (BID) | ORAL | Status: DC | PRN

## 2013-11-18 NOTE — Progress Notes (Signed)
Subjective:    Patient ID: Tracy Cook, female    DOB: Apr 08, 1956, 58 y.o.   MRN: [id number]  HPI  Tracy Cook  Reports 5-6 days with sore throat , nasal congestion and now cough productive of green sputum.  No documented fever no chills no chest pain no SOB  Allergies  Allergen Reactions  . Codeine Nausea And Vomiting   Past Medical History  Diagnosis Date  . Tachycardia, unspecified   . Other malaise and fatigue   . Migraine   . POTS (postural orthostatic tachycardia syndrome)   . Persistent disorder of initiating or maintaining sleep   . Orthostatic hypotension   . Insomnia   . Menopause    History reviewed. No pertinent past surgical history. History   Social History  . Marital Status: Married    Spouse Name: N/A    Number of Children: 2  . Years of Education: N/A   Occupational History  .      Medial lab technologist   Social History Main Topics  . Smoking status: Never Smoker   . Smokeless tobacco: Never Used  . Alcohol Use: No  . Drug Use: Not on file  . Sexual Activity: Yes    Partners: Male   Other Topics Concern  . Not on file   Social History Narrative  . No narrative on file   Family History  Problem Relation Age of Onset  . Coronary artery disease Father     Died of MI at age 58  . Asthma Father   . Hypertension Father   . Sudden death Father   . Heart attack Father   . Stroke Mother   . Diabetes Neg Hx   . Hyperlipidemia Neg Hx    Patient Active Problem List   Diagnosis Date Noted  . Lumbar spondylosis 03/19/2013  . Conjunctivitis 06/30/2012  . Vaginal dryness, menopausal 06/30/2012  . Exposure to influenza 06/30/2012  . Toe pain 06/25/2012  . Colon polyps 04/17/2012  . Dermatitis 03/31/2012  . Postmenopausal atrophic vaginitis 03/31/2012  . Menopause 01/17/2012  . POTS (postural orthostatic tachycardia syndrome) 01/17/2012  . Cardiomyopathy 11/07/2011  . Chest pain 11/07/2011  . UNSPECIFIED TACHYCARDIA 01/12/2009  .  MIGRAINE HEADACHE 12/15/2008  . INADEQUATE SLEEP HYGIENE 04/22/2008  . PERSISTENT DISORDER INITIATING/MAINTAINING SLEEP 03/11/2008  . Other Malaise and Fatigue 11/27/2007  . MITRAL VALVE PROLAPSE 10/30/2007  . POSTURAL HYPOTENSION 10/30/2007  . HYPERMOBILITY SYNDROME 10/30/2007   Current Outpatient Prescriptions on File Prior to Visit  Medication Sig Dispense Refill  . amphetamine-dextroamphetamine (ADDERALL) 10 MG tablet       . propranolol (INDERAL) 20 MG tablet Take one tablet bid  60 tablet  4  . SUMAtriptan (IMITREX) 100 MG tablet TAKE 1 TABLET BY MOUTH AT ONSET OF HEADACHE, MAY REPEAT ONE TIME ONLY IN 2 HOURS  10 tablet  3  . zolpidem (AMBIEN) 10 MG tablet Take 10 mg by mouth as needed.        No current facility-administered medications on file prior to visit.     Review of Systems See HPI    Objective:   Physical Exam  Physical Exam  Nursing note and vitals reviewed.  Constitutional: She is oriented to person, place, and time. She appears well-developed and well-nourished. She is cooperative.  HENT:  Head: Normocephalic and atraumatic.  Nose: Mucosal edema present.  O/P  Slight erythema Eyes: Conjunctivae and EOM are normal. Pupils are equal, round, and reactive to light.  Neck: Neck supple.  Cardiovascular: Regular rhythm, normal heart sounds, intact distal pulses and normal pulses. Exam reveals no gallop and no friction rub.  No murmur heard.  Pulmonary/Chest: She has no wheezes. She has rhonchi. She has no rales.  Neurological: She is alert and oriented to person, place, and time.  Skin: Skin is warm and dry. No abrasion, no bruising, no ecchymosis and no rash noted. No cyanosis. Nails show no clubbing.  Psychiatric: She has a normal mood and affect. Her speech is normal and behavior is normal.           Assessment & Plan:  Bronchitis   willl give Z-pack  Pharyngitis  See above  Cough  Delsym OTC or RX tessalon perles  See me if not better

## 2013-12-22 ENCOUNTER — Other Ambulatory Visit: Payer: Self-pay | Admitting: *Deleted

## 2013-12-22 NOTE — Telephone Encounter (Signed)
Refill request

## 2013-12-23 MED ORDER — SUMATRIPTAN SUCCINATE 100 MG PO TABS: ORAL_TABLET | ORAL | Status: DC

## 2013-12-24 ENCOUNTER — Other Ambulatory Visit: Payer: Self-pay | Admitting: Internal Medicine

## 2013-12-25 ENCOUNTER — Other Ambulatory Visit: Payer: Self-pay | Admitting: *Deleted

## 2013-12-25 MED ORDER — SUMATRIPTAN SUCCINATE 100 MG PO TABS: ORAL_TABLET | ORAL | Status: DC

## 2014-03-31 ENCOUNTER — Encounter: Payer: Self-pay | Admitting: Cardiology

## 2014-03-31 ENCOUNTER — Ambulatory Visit (INDEPENDENT_AMBULATORY_CARE_PROVIDER_SITE_OTHER): Payer: BC Managed Care – PPO | Admitting: Cardiology

## 2014-03-31 VITALS — BP 122/76 | HR 79 | Ht 70.0 in | Wt 128.1 lb

## 2014-03-31 DIAGNOSIS — I951 Orthostatic hypotension: Secondary | ICD-10-CM

## 2014-03-31 DIAGNOSIS — I498 Other specified cardiac arrhythmias: Secondary | ICD-10-CM

## 2014-03-31 DIAGNOSIS — I429 Cardiomyopathy, unspecified: Secondary | ICD-10-CM

## 2014-03-31 DIAGNOSIS — R000 Tachycardia, unspecified: Secondary | ICD-10-CM

## 2014-03-31 DIAGNOSIS — G90A Postural orthostatic tachycardia syndrome (POTS): Secondary | ICD-10-CM

## 2014-03-31 DIAGNOSIS — I428 Other cardiomyopathies: Secondary | ICD-10-CM

## 2014-03-31 MED ORDER — PROPRANOLOL HCL 20 MG PO TABS: 20.0000 mg | ORAL_TABLET | Freq: Three times a day (TID) | ORAL | Status: DC

## 2014-03-31 NOTE — Assessment & Plan Note (Signed)
LV function normal on most recent MUGA.

## 2014-03-31 NOTE — Patient Instructions (Signed)
A REFILL FOR INDERAL HAS BEEN SENT IN TODAY  Your physician wants you to follow-up in: [place] will receive a reminder letter in the mail two months in advance. If you don't receive a letter, please call our office to schedule the follow-up appointment.

## 2014-03-31 NOTE — Progress Notes (Signed)
      HPI: FU cardiomyopathy. H/O POTS. Stress echocardiogram performed in [place] in February of 2013 showed an ejection fraction of 45% at rest that improves to 70% with exercise. MUGA 4/13 showed EF 60. Last seen 4/13. Since then, There is no dyspnea, chest pain, palpitations or syncope. She has some fatigue that she attributes to Inderal.   Current Outpatient Prescriptions  Medication Sig Dispense Refill  . amphetamine-dextroamphetamine (ADDERALL) 10 MG tablet Take 10 mg by mouth 2 (two) times daily.       . propranolol (INDERAL) 20 MG tablet Take 20 mg by mouth 3 (three) times daily. Take one tablet bid      . SUMAtriptan (IMITREX) 100 MG tablet Take one at onset of headache.  May repeat in 2 hours if headache persists or recurs.  10 tablet  3   No current facility-administered medications for this visit.     Past Medical History  Diagnosis Date  . Tachycardia, unspecified   . Other malaise and fatigue   . Migraine   . POTS (postural orthostatic tachycardia syndrome)   . Persistent disorder of initiating or maintaining sleep   . Orthostatic hypotension   . Insomnia   . Menopause   . Cardiomyopathy     History reviewed. No pertinent past surgical history.  History   Social History  . Marital Status: Married    Spouse Name: N/A    Number of Children: 2  . Years of Education: N/A   Occupational History  .      Medial lab technologist   Social History Main Topics  . Smoking status: Never Smoker   . Smokeless tobacco: Never Used  . Alcohol Use: No  . Drug Use: Not on file  . Sexual Activity: Yes    Partners: Male   Other Topics Concern  . Not on file   Social History Narrative  . No narrative on file    ROS: no fevers or chills, productive cough, hemoptysis, dysphasia, odynophagia, melena, hematochezia, dysuria, hematuria, rash, seizure activity, orthopnea, PND, pedal edema, claudication. Remaining systems are negative.  Physical Exam: Well-developed  well-nourished in no acute distress.  Skin is warm and dry.  HEENT is normal.  Neck is supple.  Chest is clear to auscultation with normal expansion.  Cardiovascular exam is regular rate and rhythm.  Abdominal exam nontender or distended. No masses palpated. Extremities show no edema. neuro grossly intact  ECG Sinus rhythm at a rate of 79. ST changes.

## 2014-03-31 NOTE — Assessment & Plan Note (Addendum)
Continue propranolol. She is interested in trying Ivabradine and discontinuing propranolol as she feels it is contributing to fatigue. I will review further prior to making recommendations.  Reviewed information on above medication; I f channel blocker; used in CHF and stable angina; there appears to be limited experience in [place] for use in POTS; it would also be off label use; I have elected not to initiate at this point.

## 2014-05-24 ENCOUNTER — Encounter: Payer: Self-pay | Admitting: Cardiology

## 2014-06-10 ENCOUNTER — Telehealth: Payer: Self-pay

## 2014-06-10 ENCOUNTER — Ambulatory Visit (INDEPENDENT_AMBULATORY_CARE_PROVIDER_SITE_OTHER): Payer: BC Managed Care – PPO | Admitting: Internal Medicine

## 2014-06-10 ENCOUNTER — Encounter: Payer: Self-pay | Admitting: Internal Medicine

## 2014-06-10 VITALS — BP 111/78 | HR 82 | Temp 97.7°F | Resp 16 | Ht 69.5 in | Wt 128.0 lb

## 2014-06-10 DIAGNOSIS — Z0001 Encounter for general adult medical examination with abnormal findings: Secondary | ICD-10-CM

## 2014-06-10 DIAGNOSIS — Z1211 Encounter for screening for malignant neoplasm of colon: Secondary | ICD-10-CM | POA: Diagnosis not present

## 2014-06-10 DIAGNOSIS — I341 Nonrheumatic mitral (valve) prolapse: Secondary | ICD-10-CM

## 2014-06-10 DIAGNOSIS — K635 Polyp of colon: Secondary | ICD-10-CM | POA: Diagnosis not present

## 2014-06-10 DIAGNOSIS — Z9189 Other specified personal risk factors, not elsewhere classified: Secondary | ICD-10-CM

## 2014-06-10 DIAGNOSIS — I420 Dilated cardiomyopathy: Secondary | ICD-10-CM

## 2014-06-10 DIAGNOSIS — G43009 Migraine without aura, not intractable, without status migrainosus: Secondary | ICD-10-CM | POA: Diagnosis not present

## 2014-06-10 DIAGNOSIS — Z0000 Encounter for general adult medical examination without abnormal findings: Secondary | ICD-10-CM

## 2014-06-10 LAB — HEMOCCULT GUIAC POC 1CARD (OFFICE): Fecal Occult Blood, POC: NEGATIVE

## 2014-06-10 LAB — POCT URINALYSIS DIPSTICK
Bilirubin, UA: NEGATIVE
Blood, UA: NEGATIVE
Glucose, UA: NEGATIVE
Ketones, UA: NEGATIVE
Leukocytes, UA: NEGATIVE
Nitrite, UA: NEGATIVE
Protein, UA: NEGATIVE
Spec Grav, UA: 1.01
Urobilinogen, UA: NEGATIVE
pH, UA: 7.5

## 2014-06-10 MED ORDER — SUMATRIPTAN SUCCINATE 100 MG PO TABS: ORAL_TABLET | ORAL | Status: DC

## 2014-06-10 NOTE — Progress Notes (Signed)
Subjective:    Patient ID: Tracy Cook, female    DOB: 19-Jul-1956, 58 y.o.   MRN: [id number]  HPI 03/31/2014 cardiology note Cardiomyopathy - Lelon Perla, MD at 03/31/2014 9:38 AM     Status: Written Related Problem: Cardiomyopathy   Expand All Collapse All   LV function normal on most recent MUGA.            POTS (postural orthostatic tachycardia syndrome) - Lelon Perla, MD at 03/31/2014 9:39 AM     Status: Alison Stalling Related Problem: POTS (postural orthostatic tachycardia syndrome)   Expand All Collapse All   Continue propranolol. She is interested in trying Ivabradine and discontinuing propranolol as she feels it is contributing to fatigue. I will review further prior to making recommendations.  Reviewed information on above medication; I f channel blocker; used in CHF and stable angina; there appears to be limited experience in [place] for use in POTS; it would also be off label use; I have elected not to initiate at this point.        04/2013 note Neck pain likely M/S   Low clinical suspicion for HNP Will give RElafen bid and short course of prednisone. Flexeril she has at [facility] and makes her too groggy  Situational anxiety Ok for 0.5 mg ativan 1/2 tab   Flu vaccine today  Todays' note  [name] is here for CPE   HM:  She is due for mm,  Colonoscopy next year  Pap due next year  [name] is a non-smoker.   CM/MVP/POTS  No palpitations  No chest pain.    Migraine  Sumatriptan helping    Review of Systems  Respiratory: Negative for cough, chest tightness, shortness of breath, wheezing and stridor.   Cardiovascular: Negative for chest pain, palpitations and leg swelling.  All other systems reviewed and are negative.      Objective:   Physical Exam Physical Exam  Nursing note and vitals reviewed.  Constitutional: She is oriented to person, place, and time. She appears well-developed and well-nourished.  HENT:  Head: Normocephalic and  atraumatic.  Right Ear: Tympanic membrane and ear canal normal. No drainage. Tympanic membrane is not injected and not erythematous.  Left Ear: Tympanic membrane and ear canal normal. No drainage. Tympanic membrane is not injected and not erythematous.  Nose: Nose normal. Right sinus exhibits no maxillary sinus tenderness and no frontal sinus tenderness. Left sinus exhibits no maxillary sinus tenderness and no frontal sinus tenderness.  Mouth/Throat: Oropharynx is clear and moist. No oral lesions. No oropharyngeal exudate.  Eyes: Conjunctivae and EOM are normal. Pupils are equal, round, and reactive to light.  Neck: Normal range of motion. Neck supple. No JVD present. Carotid bruit is not present. No mass and no thyromegaly present.  Cardiovascular: Normal rate, regular rhythm, S1 normal, S2 normal and intact distal pulses. Exam reveals no gallop and no friction rub.  No murmur heard.  Pulses:  Carotid pulses are 2+ on the right side, and 2+ on the left side.  Dorsalis pedis pulses are 2+ on the right side, and 2+ on the left side.  No carotid bruit. No LE edema  Pulmonary/Chest: Breath sounds normal. She has no wheezes. She has no rales. She exhibits no tenderness.   Breast  NO discrete mass no nipple discharge no axillary adenpathy bilaterally  Abdominal: Soft. Bowel sounds are normal. She exhibits no distension and no mass. There is no hepatosplenomegaly. There is no tenderness. There is no CVA tenderness.  Musculoskeletal: Normal range of motion.  No active synovitis to joints.  Lymphadenopathy:  She has no cervical adenopathy.  She has no axillary adenopathy.  Right: No inguinal and no supraclavicular adenopathy present.  Left: No inguinal and no supraclavicular adenopathy present.  Neurological: She is alert and oriented to person, place, and time. She has normal strength and normal reflexes. She displays no tremor. No cranial nerve deficit or sensory deficit. Coordination and gait  normal.  Skin: Skin is warm and dry. No rash noted. No cyanosis. Nails show no clubbing.  Psychiatric: She has a normal mood and affect. Her speech is normal and behavior is normal. Cognition and memory are normal.         Assessment & Plan:  HM  Will schedule 3D mm ,  Colonoscopy Dr. Collene Mares 2016,  Counseled lung cancer screening guideline  She is a non-smoker.   CM/MVP/POTS  Advised to take 81 g coated ASA  She is counseled regarding Gi SE and to stop if stomach irritation  Migraine  Continue sumatriptan  Colon polyps

## 2014-06-10 NOTE — Telephone Encounter (Signed)
Called Tracy Cook to see if going back to [facility] for her mammogram and dexa scan. [facility] they said for better follow up results it was better to go back to same place, plus Dr Coralyn Mark had mentioned something about Ortho doctor given reason for bone density fu

## 2014-06-10 NOTE — Addendum Note (Signed)
Addended by: Emi Belfast D on: 06/10/2014 01:01 PM   Modules accepted: Orders

## 2014-06-11 ENCOUNTER — Encounter: Payer: Self-pay | Admitting: *Deleted

## 2014-06-24 ENCOUNTER — Ambulatory Visit (INDEPENDENT_AMBULATORY_CARE_PROVIDER_SITE_OTHER): Payer: BC Managed Care – PPO | Admitting: Internal Medicine

## 2014-06-24 ENCOUNTER — Encounter: Payer: Self-pay | Admitting: Internal Medicine

## 2014-06-24 VITALS — BP 111/76 | HR 76 | Resp 16 | Ht 69.5 in | Wt 130.0 lb

## 2014-06-24 DIAGNOSIS — M62830 Muscle spasm of back: Secondary | ICD-10-CM | POA: Diagnosis not present

## 2014-06-24 DIAGNOSIS — M5489 Other dorsalgia: Secondary | ICD-10-CM

## 2014-06-24 MED ORDER — TIZANIDINE HCL 2 MG PO CAPS: ORAL_CAPSULE | ORAL | Status: DC

## 2014-06-24 MED ORDER — METHYLPREDNISOLONE ACETATE 80 MG/ML IJ SUSP
80.0000 mg | Freq: Once | INTRAMUSCULAR | Status: AC
  Administered 2014-06-24: 80 mg via INTRAMUSCULAR

## 2014-06-24 MED ORDER — PREDNISONE 20 MG PO TABS: ORAL_TABLET | ORAL | Status: DC

## 2014-06-24 NOTE — Patient Instructions (Signed)
See me if not better 

## 2014-06-24 NOTE — Telephone Encounter (Signed)
Made appointments for 07/27/14

## 2014-06-24 NOTE — Progress Notes (Signed)
   Subjective:    Patient ID: Tracy Cook, female    DOB: [birth year], 57 y.o.   MRN: [id number]  HPI 11/19/ HM Will schedule 3D mm , Colonoscopy Dr. Collene Mares 2016, Counseled lung cancer screening guideline She is a non-smoker.   CM/MVP/POTS Advised to take 81 g coated ASA She is counseled regarding Gi SE and to stop if stomach irritation  Migraine Continue sumatriptan  Colon polyps   TODAY   Tracy Cook is have muscle pain in thoracic back that began 2 days ago.  Was weight lifting and reports she is somewhat better today but pain comes and goes.  No LE numbness no musle weakness.  No bowel or bladder changes  Prednisone has helped in the past.   Cannot tolerate flexeril but would like ot try alternative   Review of Systems See HPI    Objective:   Physical Exam Physical Exam  Nursing note and vitals reviewed.  Constitutional: She is oriented to person, place, and time. She appears well-developed and well-nourished.  HENT:  Head: Normocephalic and atraumatic.  Cardiovascular: Normal rate and regular rhythm. Exam reveals no gallop and no friction rub.  No murmur heard.  Pulmonary/Chest: Breath sounds normal. She has no wheezes. She has no rales.  Neurological: She is alert and oriented to person, place, and time.  LE:  Reflexes 2+ symmetric Motor 5/5 all groups tested Sensory intact to microfilament Skin: Skin is warm and dry.  Psychiatric: She has a normal mood and affect. Her behavior is normal.        Assessment & Plan:  Thoracic muscle spasm:  Will give Depomedrol 80 mg Im today  Prednisone 40mg  taper.  No upper body weight lifting for 7-10 days until healed  OK to try Zanaflex q 8h prn  See me if any worsening

## 2014-06-28 ENCOUNTER — Encounter: Payer: Self-pay | Admitting: Internal Medicine

## 2014-07-27 ENCOUNTER — Other Ambulatory Visit: Payer: Self-pay

## 2014-07-27 ENCOUNTER — Other Ambulatory Visit (HOSPITAL_COMMUNITY): Payer: BC Managed Care – PPO

## 2014-07-27 ENCOUNTER — Ambulatory Visit (HOSPITAL_COMMUNITY): Payer: BLUE CROSS/BLUE SHIELD

## 2014-07-27 MED ORDER — SUMATRIPTAN SUCCINATE 100 MG PO TABS
ORAL_TABLET | ORAL | Status: DC
Start: 2014-07-27 — End: 2014-11-15

## 2014-07-27 NOTE — Telephone Encounter (Signed)
Tracy Cook said that her insurance will only pay for 5 tablets at a time. She is using 5 every month and goes without sometimes because she doesn't have anymore and can not get it refilled. She is thinking maybe she could try something else forher migraines to see if her insurance will allow more

## 2014-07-27 NOTE — Telephone Encounter (Signed)
Tracy Cook [phone] [facility]  Kayelyn called and she has a migraine and she needs a refill for SUMAtriptan (IMITREX) 100 MG tablet, called into [facility]

## 2014-09-10 ENCOUNTER — Ambulatory Visit (HOSPITAL_COMMUNITY)
Admission: RE | Admit: 2014-09-10 | Discharge: 2014-09-10 | Disposition: A | Discharge status: Home / Self Care | Payer: BLUE CROSS/BLUE SHIELD | Source: Ambulatory Visit | Attending: Internal Medicine | Admitting: Internal Medicine

## 2014-09-10 DIAGNOSIS — Z13820 Encounter for screening for osteoporosis: Secondary | ICD-10-CM | POA: Insufficient documentation

## 2014-09-10 DIAGNOSIS — Z9189 Other specified personal risk factors, not elsewhere classified: Secondary | ICD-10-CM

## 2014-09-10 DIAGNOSIS — Z780 Asymptomatic menopausal state: Secondary | ICD-10-CM | POA: Diagnosis not present

## 2014-09-21 ENCOUNTER — Telehealth: Payer: Self-pay | Admitting: Internal Medicine

## 2014-09-21 NOTE — Telephone Encounter (Signed)
Tracy Cook  Call pt and let her know I have received her bone density and it shows osteoporosis.   set up a 30 min appt with me to discuss options for treatment.    Route back with date of visit  '  Thanks

## 2014-09-21 NOTE — Telephone Encounter (Signed)
Patient aware of her DEXA results. She is scheduled for tomorrow to come in and start on treatment

## 2014-09-22 ENCOUNTER — Ambulatory Visit (INDEPENDENT_AMBULATORY_CARE_PROVIDER_SITE_OTHER): Payer: BLUE CROSS/BLUE SHIELD | Admitting: Internal Medicine

## 2014-09-22 ENCOUNTER — Encounter: Payer: Self-pay | Admitting: Internal Medicine

## 2014-09-22 VITALS — BP 115/83 | HR 84 | Resp 16 | Ht 69.5 in | Wt 128.0 lb

## 2014-09-22 DIAGNOSIS — M810 Age-related osteoporosis without current pathological fracture: Secondary | ICD-10-CM

## 2014-09-22 DIAGNOSIS — S32030A Wedge compression fracture of third lumbar vertebra, initial encounter for closed fracture: Secondary | ICD-10-CM | POA: Insufficient documentation

## 2014-09-22 LAB — CBC WITH DIFFERENTIAL/PLATELET
Basophils Absolute: 0.1 10*3/uL (ref 0.0–0.1)
Basophils Relative: 2 % — ABNORMAL HIGH (ref 0–1)
Eosinophils Absolute: 0.1 10*3/uL (ref 0.0–0.7)
Eosinophils Relative: 2 % (ref 0–5)
HCT: 41 % (ref 36.0–46.0)
Hemoglobin: 13.9 g/dL (ref 12.0–15.0)
Lymphocytes Relative: 32 % (ref 12–46)
Lymphs Abs: 2 10*3/uL (ref 0.7–4.0)
MCH: 30.4 pg (ref 26.0–34.0)
MCHC: 33.9 g/dL (ref 30.0–36.0)
MCV: 89.7 fL (ref 78.0–100.0)
MPV: 9.4 fL (ref 8.6–12.4)
Monocytes Absolute: 0.4 10*3/uL (ref 0.1–1.0)
Monocytes Relative: 7 % (ref 3–12)
Neutro Abs: 3.5 10*3/uL (ref 1.7–7.7)
Neutrophils Relative %: 57 % (ref 43–77)
Platelets: 302 10*3/uL (ref 150–400)
RBC: 4.57 MIL/uL (ref 3.87–5.11)
RDW: 13 % (ref 11.5–15.5)
WBC: 6.1 10*3/uL (ref 4.0–10.5)

## 2014-09-22 LAB — LIPID PANEL
Cholesterol: 140 mg/dL (ref 0–200)
HDL: 63 mg/dL (ref >=46)
LDL Cholesterol: 61 mg/dL (ref 0–99)
Total CHOL/HDL Ratio: 2.2 Ratio
Triglycerides: 81 mg/dL (ref <150)
VLDL: 16 mg/dL (ref 0–40)

## 2014-09-22 LAB — COMPREHENSIVE METABOLIC PANEL
ALT: 21 U/L (ref 0–35)
AST: 22 U/L (ref 0–37)
Albumin: 4.2 g/dL (ref 3.5–5.2)
Alkaline Phosphatase: 68 U/L (ref 39–117)
BUN: 15 mg/dL (ref 6–23)
CO2: 30 mEq/L (ref 19–32)
Calcium: 9.5 mg/dL (ref 8.4–10.5)
Chloride: 102 mEq/L (ref 96–112)
Creat: 0.77 mg/dL (ref 0.50–1.10)
Glucose, Bld: 97 mg/dL (ref 70–99)
Potassium: 4.1 mEq/L (ref 3.5–5.3)
Sodium: 139 mEq/L (ref 135–145)
Total Bilirubin: 0.5 mg/dL (ref 0.2–1.2)
Total Protein: 6.5 g/dL (ref 6.0–8.3)

## 2014-09-22 LAB — TSH: TSH: 0.581 u[IU]/mL (ref 0.350–4.500)

## 2014-09-22 MED ORDER — ALENDRONATE SODIUM 70 MG PO TABS: 70.0000 mg | ORAL_TABLET | ORAL | Status: DC

## 2014-09-22 NOTE — Progress Notes (Signed)
Subjective:    Patient ID: Tracy Cook, female    DOB: 08-Jun-1956, 59 y.o.   MRN: [id number]  HPI 06/24/2014 note Assessment & Plan:  Thoracic muscle spasm: Will give Depomedrol 80 mg Im today Prednisone 40mg  taper. No upper body weight lifting for 7-10 days until healed  OK to try Zanaflex q 8h prn  See me if any worsening       TODAY  Tracy Cook is here for evaluation of osteroporosis  See Dexa  -3.0 lumbar spine   -2.3  R hip   She also has a history of L3 compression FX and was told recently by Tracy Cook she has a thoracic compression FX.  Mother had osteoporosis at young age and sister also may have osteoporosis  Pt does not like to take meds but feels like she has no choice.  She tells me she did not get her vitamin D or any lab work done back in Nov when CPE done.    Reasons are unclear to me   Allergies  Allergen Reactions  . Vicodin [Hydrocodone-Acetaminophen] Nausea And Vomiting  . Codeine Nausea And Vomiting   Past Medical History  Diagnosis Date  . Tachycardia, unspecified   . Other malaise and fatigue   . Migraine   . POTS (postural orthostatic tachycardia syndrome)   . Persistent disorder of initiating or maintaining sleep   . Orthostatic hypotension   . Insomnia   . Menopause   . Cardiomyopathy    No past surgical history on file. History   Social History  . Marital Status: Married    Spouse Name: N/A  . Number of Children: 2  . Years of Education: N/A   Occupational History  .      Medial lab technologist   Social History Main Topics  . Smoking status: Never Smoker   . Smokeless tobacco: Never Used  . Alcohol Use: No  . Drug Use: Not on file  . Sexual Activity:    Partners: Male   Other Topics Concern  . Not on file   Social History Narrative   Family History  Problem Relation Age of Onset  . Coronary artery disease Father     Died of MI at age 23  . Asthma Father   . Hypertension Father   . Sudden death Father   . Heart  attack Father   . Stroke Mother   . Diabetes Neg Hx   . Hyperlipidemia Neg Hx    Patient Active Problem List   Diagnosis Date Noted  . Lumbar spondylosis 03/19/2013  . Conjunctivitis 06/30/2012  . Vaginal dryness, menopausal 06/30/2012  . Exposure to influenza 06/30/2012  . Toe pain 06/25/2012  . Colon polyps 04/17/2012  . Dermatitis 03/31/2012  . Postmenopausal atrophic vaginitis 03/31/2012  . Menopause 01/17/2012  . POTS (postural orthostatic tachycardia syndrome) 01/17/2012  . Congestive dilated cardiomyopathy 11/07/2011  . Chest pain 11/07/2011  . UNSPECIFIED TACHYCARDIA 01/12/2009  . Migraine 12/15/2008  . INADEQUATE SLEEP HYGIENE 04/22/2008  . PERSISTENT DISORDER INITIATING/MAINTAINING SLEEP 03/11/2008  . Other Malaise and Fatigue 11/27/2007  . MVP (mitral valve prolapse) 10/30/2007  . POSTURAL HYPOTENSION 10/30/2007  . HYPERMOBILITY SYNDROME 10/30/2007   Current Outpatient Prescriptions on File Prior to Visit  Medication Sig Dispense Refill  . amphetamine-dextroamphetamine (ADDERALL) 10 MG tablet Take 10 mg by mouth 2 (two) times daily.     . celecoxib (CELEBREX) 200 MG capsule   0  . predniSONE (DELTASONE) 20 MG tablet Take 2  tablets in am daily for 3 days then 1 tablet daily in am for 3 days then stop 9 tablet 0  . propranolol (INDERAL) 20 MG tablet Take 1 tablet (20 mg total) by mouth 3 (three) times daily. 90 tablet 11  . SUMAtriptan (IMITREX) 100 MG tablet Take one at onset of headache.  May repeat in 2 hours if headache persists or recurs. 30 tablet 2  . tizanidine (ZANAFLEX) 2 MG capsule Take one tablet q 8h prn muscle spasm 20 capsule 0   No current facility-administered medications on file prior to visit.       Review of Systems See HPI    Objective:   Physical Exam Physical Exam  Nursing note and vitals reviewed.  Constitutional: She is oriented to person, place, and time. She appears well-developed and well-nourished.  HENT:  Head: Normocephalic  and atraumatic.  Cardiovascular: Normal rate and regular rhythm. Exam reveals no gallop and no friction rub.  No murmur heard.  Pulmonary/Chest: Breath sounds normal. She has no wheezes. She has no rales.  Neurological: She is alert and oriented to person, place, and time.  Skin: Skin is warm and dry.  Psychiatric: She has a normal mood and affect. Her behavior is normal.              Assessment & Plan:  Osteoporosis:  Pt resistant to meds but is willing to take bisphosphanate.    With strong FH I advised workup for secondary causes and consult with rheumatologist by pt declines at this point.  She is willing to take weekly Fosamax.   Advised of GI SE and risk of ONJ and atypical femur fx.    Advised if any pain in thigh or leg she is to notify office and stop med.  She voices understanding of all SE and how to take med.   She is to see me in 4 weeks.  ADvised to get labs today   Lumbar compression FX  L3 .  She is pain free now.    Managed by orthopedic.    See me in 4 weeks  Labs today including TSH and calcium and vitamin D    She does not want any labs looking for secondary causes

## 2014-09-23 ENCOUNTER — Encounter: Payer: Self-pay | Admitting: *Deleted

## 2014-09-23 LAB — VITAMIN D 25 HYDROXY (VIT D DEFICIENCY, FRACTURES): Vit D, 25-Hydroxy: 24 ng/mL — ABNORMAL LOW (ref 30–100)

## 2014-09-24 ENCOUNTER — Telehealth: Payer: Self-pay | Admitting: *Deleted

## 2014-09-24 NOTE — Telephone Encounter (Signed)
-----   Message from Lanice Shirts, MD sent at 09/23/2014  2:43 PM EST ----- Call Myrtice and let her know her vitamin D is a little low  .  ADvise her to take 1000 units of D3 daily

## 2014-09-24 NOTE — Telephone Encounter (Signed)
Zenya is aware of her lab results

## 2014-10-20 ENCOUNTER — Encounter: Payer: BLUE CROSS/BLUE SHIELD | Admitting: Internal Medicine

## 2014-10-20 DIAGNOSIS — Z09 Encounter for follow-up examination after completed treatment for conditions other than malignant neoplasm: Secondary | ICD-10-CM

## 2014-10-20 NOTE — Progress Notes (Signed)
Subjective:    Patient ID: Tracy Cook, female    DOB: 10-17-1955, 59 y.o.   MRN: [id number]  HPI  09/22/2014 note Assessment & Plan:  Osteoporosis: Pt resistant to meds but is willing to take bisphosphanate. With strong FH I advised workup for secondary causes and consult with rheumatologist by pt declines at this point. She is willing to take weekly Fosamax.  Advised of GI SE and risk of ONJ and atypical femur fx. Advised if any pain in thigh or leg she is to notify office and stop med. She voices understanding of all SE and how to take med. She is to see me in 4 weeks. ADvised to get labs today   Lumbar compression FX L3 . She is pain free now. Managed by orthopedic.   See me in 4 weeks Labs today including TSH and calcium and vitamin D She does not want any labs looking for secondary causes           TODAY  Tracy Cook returns for follow up after initiating weekly Fosamax    For osteoporosis  Vitamin D was slightly low and she was advised OTC supplementation  Allergies  Allergen Reactions  . Vicodin [Hydrocodone-Acetaminophen] Nausea And Vomiting  . Codeine Nausea And Vomiting   Past Medical History  Diagnosis Date  . Tachycardia, unspecified   . Other malaise and fatigue   . Migraine   . POTS (postural orthostatic tachycardia syndrome)   . Persistent disorder of initiating or maintaining sleep   . Orthostatic hypotension   . Insomnia   . Menopause   . Cardiomyopathy   . Osteoporosis    No past surgical history on file. History   Social History  . Marital Status: Married    Spouse Name: N/A  . Number of Children: 2  . Years of Education: N/A   Occupational History  .      Medial lab technologist   Social History Main Topics  . Smoking status: Never Smoker   . Smokeless tobacco: Never Used  . Alcohol Use: No  . Drug Use: Not on file  . Sexual Activity:    Partners: Male   Other Topics Concern  . Not on file   Social  History Narrative   Family History  Problem Relation Age of Onset  . Coronary artery disease Father     Died of MI at age 84  . Asthma Father   . Hypertension Father   . Sudden death Father   . Heart attack Father   . Stroke Mother   . Diabetes Neg Hx   . Hyperlipidemia Neg Hx    Patient Active Problem List   Diagnosis Date Noted  . Osteoporosis 09/22/2014  . Compression fracture of L3 lumbar vertebra 09/22/2014  . Lumbar spondylosis 03/19/2013  . Conjunctivitis 06/30/2012  . Vaginal dryness, menopausal 06/30/2012  . Exposure to influenza 06/30/2012  . Toe pain 06/25/2012  . Colon polyps 04/17/2012  . Dermatitis 03/31/2012  . Postmenopausal atrophic vaginitis 03/31/2012  . Menopause 01/17/2012  . POTS (postural orthostatic tachycardia syndrome) 01/17/2012  . Congestive dilated cardiomyopathy 11/07/2011  . Chest pain 11/07/2011  . UNSPECIFIED TACHYCARDIA 01/12/2009  . Migraine 12/15/2008  . INADEQUATE SLEEP HYGIENE 04/22/2008  . PERSISTENT DISORDER INITIATING/MAINTAINING SLEEP 03/11/2008  . Other Malaise and Fatigue 11/27/2007  . MVP (mitral valve prolapse) 10/30/2007  . POSTURAL HYPOTENSION 10/30/2007  . HYPERMOBILITY SYNDROME 10/30/2007   Current Outpatient Prescriptions on File Prior to Visit  Medication Sig Dispense  Refill  . alendronate (FOSAMAX) 70 MG tablet Take 1 tablet (70 mg total) by mouth once a week. Take with a full glass of water on an empty stomach. 12 tablet 1  . amphetamine-dextroamphetamine (ADDERALL) 10 MG tablet Take 10 mg by mouth 2 (two) times daily.     . celecoxib (CELEBREX) 200 MG capsule   0  . clonazePAM (KLONOPIN) 0.5 MG tablet   0  . propranolol (INDERAL) 20 MG tablet Take 1 tablet (20 mg total) by mouth 3 (three) times daily. 90 tablet 11  . SUMAtriptan (IMITREX) 100 MG tablet Take one at onset of headache.  May repeat in 2 hours if headache persists or recurs. 30 tablet 2   No current facility-administered medications on file prior to  visit.      Review of Systems See HPI    Objective:   Physical Exam  Physical Exam  Nursing note and vitals reviewed.  Constitutional: She is oriented to person, place, and time. She appears well-developed and well-nourished.  HENT:  Head: Normocephalic and atraumatic.  Cardiovascular: Normal rate and regular rhythm. Exam reveals no gallop and no friction rub.  No murmur heard.  Pulmonary/Chest: Breath sounds normal. She has no wheezes. She has no rales.  Neurological: She is alert and oriented to person, place, and time.  Skin: Skin is warm and dry.  Psychiatric: She has a normal mood and affect. Her behavior is normal.             Assessment & Plan:  Osteoporosis  :  Tolerating   Low vitamin D

## 2014-11-14 NOTE — Progress Notes (Signed)
Subjective:    Patient ID: Tracy Cook, female    DOB: [birth year], 59 y.o.   MRN: [id number]  HPI  09/22/2014 Assessment & Plan:  Osteoporosis: Pt resistant to meds but is willing to take bisphosphanate. With strong FH I advised workup for secondary causes and consult with rheumatologist by pt declines at this point. She is willing to take weekly Fosamax.  Advised of GI SE and risk of ONJ and atypical femur fx. Advised if any pain in thigh or leg she is to notify office and stop med. She voices understanding of all SE and how to take med. She is to see me in 4 weeks. ADvised to get labs today   Lumbar compression FX L3 . She is pain free now. Managed by orthopedic.   See me in 4 weeks Labs today including TSH and calcium and vitamin D She does not want any labs looking for secondary causes         TODAY  Tracy Cook returns after initiation of Fosamax.  She has history of two compression fractures and reports mother had osteoporosis at a young age.  I had advised work up for secondary causes but pt  Declined  She reports she only took one Fosamax and it "made her feel funny"  So she stopped.   Allergies  Allergen Reactions  . Vicodin [Hydrocodone-Acetaminophen] Nausea And Vomiting  . Codeine Nausea And Vomiting   Past Medical History  Diagnosis Date  . Tachycardia, unspecified   . Other malaise and fatigue   . Migraine   . POTS (postural orthostatic tachycardia syndrome)   . Persistent disorder of initiating or maintaining sleep   . Orthostatic hypotension   . Insomnia   . Menopause   . Cardiomyopathy   . Osteoporosis    No past surgical history on file. History   Social History  . Marital Status: Married    Spouse Name: N/A  . Number of Children: 2  . Years of Education: N/A   Occupational History  .      Medial lab technologist   Social History Main Topics  . Smoking status: Never Smoker   . Smokeless tobacco: Never Used  . Alcohol  Use: No  . Drug Use: Not on file  . Sexual Activity:    Partners: Male   Other Topics Concern  . Not on file   Social History Narrative   Family History  Problem Relation Age of Onset  . Coronary artery disease Father     Died of MI at age 73  . Asthma Father   . Hypertension Father   . Sudden death Father   . Heart attack Father   . Stroke Mother   . Diabetes Neg Hx   . Hyperlipidemia Neg Hx    Patient Active Problem List   Diagnosis Date Noted  . Osteoporosis 09/22/2014  . Compression fracture of L3 lumbar vertebra 09/22/2014  . Lumbar spondylosis 03/19/2013  . Conjunctivitis 06/30/2012  . Vaginal dryness, menopausal 06/30/2012  . Exposure to influenza 06/30/2012  . Toe pain 06/25/2012  . Colon polyps 04/17/2012  . Dermatitis 03/31/2012  . Postmenopausal atrophic vaginitis 03/31/2012  . Menopause 01/17/2012  . POTS (postural orthostatic tachycardia syndrome) 01/17/2012  . Congestive dilated cardiomyopathy 11/07/2011  . Chest pain 11/07/2011  . UNSPECIFIED TACHYCARDIA 01/12/2009  . Migraine 12/15/2008  . INADEQUATE SLEEP HYGIENE 04/22/2008  . PERSISTENT DISORDER INITIATING/MAINTAINING SLEEP 03/11/2008  . Other malaise and fatigue 11/27/2007  . MVP (mitral valve  prolapse) 10/30/2007  . POSTURAL HYPOTENSION 10/30/2007  . HYPERMOBILITY SYNDROME 10/30/2007   Current Outpatient Prescriptions on File Prior to Visit  Medication Sig Dispense Refill  . alendronate (FOSAMAX) 70 MG tablet Take 1 tablet (70 mg total) by mouth once a week. Take with a full glass of water on an empty stomach. 12 tablet 1  . amphetamine-dextroamphetamine (ADDERALL) 10 MG tablet Take 10 mg by mouth 2 (two) times daily.     . celecoxib (CELEBREX) 200 MG capsule   0  . clonazePAM (KLONOPIN) 0.5 MG tablet   0  . propranolol (INDERAL) 20 MG tablet Take 1 tablet (20 mg total) by mouth 3 (three) times daily. 90 tablet 11  . SUMAtriptan (IMITREX) 100 MG tablet Take one at onset of headache.  May  repeat in 2 hours if headache persists or recurs. 30 tablet 2   No current facility-administered medications on file prior to visit.      Review of Systems See HPI    Objective:   Physical Exam  Physical Exam  Nursing note and vitals reviewed.  Constitutional: She is oriented to person, place, and time. She appears well-developed and well-nourished.  HENT:  Head: Normocephalic and atraumatic.  Cardiovascular: Normal rate and regular rhythm. Exam reveals no gallop and no friction rub.  No murmur heard.  Pulmonary/Chest: Breath sounds normal. She has no wheezes. She has no rales.  Neurological: She is alert and oriented to person, place, and time.  Skin: Skin is warm and dry.  Psychiatric: She has a normal mood and affect. Her behavior is normal.             Assessment & Plan:  Osteoporosis:  Will refer to rheumatology    She does not like to take meds and declines a secondary work up  Vertebral compression fx  See above.    Using NSAID prn for now

## 2014-11-15 ENCOUNTER — Ambulatory Visit (INDEPENDENT_AMBULATORY_CARE_PROVIDER_SITE_OTHER): Payer: BLUE CROSS/BLUE SHIELD | Admitting: Internal Medicine

## 2014-11-15 ENCOUNTER — Encounter: Payer: Self-pay | Admitting: Internal Medicine

## 2014-11-15 VITALS — BP 120/75 | HR 83 | Resp 16 | Ht 69.5 in | Wt 127.0 lb

## 2014-11-15 DIAGNOSIS — M810 Age-related osteoporosis without current pathological fracture: Secondary | ICD-10-CM | POA: Diagnosis not present

## 2014-11-15 MED ORDER — SUMATRIPTAN SUCCINATE 100 MG PO TABS: ORAL_TABLET | ORAL | Status: DC

## 2014-11-15 MED ORDER — CEPHALEXIN 500 MG PO CAPS: ORAL_CAPSULE | ORAL | Status: DC

## 2014-11-15 NOTE — Addendum Note (Signed)
Addended by: Emi Belfast D on: 11/15/2014 01:31 PM   Modules accepted: Orders

## 2014-11-19 ENCOUNTER — Telehealth: Payer: Self-pay | Admitting: *Deleted

## 2014-11-19 NOTE — Telephone Encounter (Signed)
I spoke with Tracy Cook and let her know that she has an appointment with Dr .Lenna Gilford on 01-20-15 @ 1:30pm. -eh

## 2015-05-10 ENCOUNTER — Encounter: Payer: Self-pay | Admitting: Family Medicine

## 2015-05-10 ENCOUNTER — Ambulatory Visit (INDEPENDENT_AMBULATORY_CARE_PROVIDER_SITE_OTHER): Payer: BLUE CROSS/BLUE SHIELD | Admitting: Family Medicine

## 2015-05-10 VITALS — BP 98/60 | HR 70 | Temp 97.9°F | Ht 70.0 in | Wt 124.4 lb

## 2015-05-10 DIAGNOSIS — G43009 Migraine without aura, not intractable, without status migrainosus: Secondary | ICD-10-CM | POA: Diagnosis not present

## 2015-05-10 DIAGNOSIS — I498 Other specified cardiac arrhythmias: Secondary | ICD-10-CM

## 2015-05-10 DIAGNOSIS — I951 Orthostatic hypotension: Secondary | ICD-10-CM

## 2015-05-10 DIAGNOSIS — G90A Postural orthostatic tachycardia syndrome (POTS): Secondary | ICD-10-CM

## 2015-05-10 DIAGNOSIS — I429 Cardiomyopathy, unspecified: Secondary | ICD-10-CM

## 2015-05-10 DIAGNOSIS — G43001 Migraine without aura, not intractable, with status migrainosus: Secondary | ICD-10-CM | POA: Diagnosis not present

## 2015-05-10 DIAGNOSIS — R000 Tachycardia, unspecified: Secondary | ICD-10-CM

## 2015-05-10 MED ORDER — PROPRANOLOL HCL 20 MG PO TABS: 20.0000 mg | ORAL_TABLET | Freq: Three times a day (TID) | ORAL | Status: DC

## 2015-05-10 NOTE — Assessment & Plan Note (Signed)
Refer to neuro Refill meds Pt is on propranolol for pots----- it does not help the migraines

## 2015-05-10 NOTE — Progress Notes (Signed)
Patient ID: Tracy Cook, female    DOB: 1955-12-03  Age: 59 y.o. MRN: [id number]    Subjective:  Subjective HPI Tracy Cook presents to establish and get a refill on her propranolol.  She has a history of POTS but has had no trouble with it in years.   She works downstairs in the labs.   She is also here with hx migraines--- about 6 a month and get #30  immitrex a month.  She has never had a work up for the headaches and she has had them since adolescent.    Review of Systems  Constitutional: Negative for diaphoresis, appetite change, fatigue and unexpected weight change.  Eyes: Negative for pain, redness and visual disturbance.  Respiratory: Negative for cough, chest tightness, shortness of breath and wheezing.   Cardiovascular: Negative for chest pain, palpitations and leg swelling.  Endocrine: Negative for cold intolerance, heat intolerance, polydipsia, polyphagia and polyuria.  Genitourinary: Negative for dysuria, frequency and difficulty urinating.  Neurological: Negative for dizziness, light-headedness, numbness and headaches.    History Past Medical History  Diagnosis Date  . Tachycardia, unspecified   . Other malaise and fatigue   . Migraine   . POTS (postural orthostatic tachycardia syndrome)   . Persistent disorder of initiating or maintaining sleep   . Orthostatic hypotension   . Insomnia   . Menopause   . Cardiomyopathy ([employer])   . Osteoporosis     She has no past surgical history on file.   Her family history includes Asthma in her father; Coronary artery disease in her father; Heart attack in her father; Hypertension in her father; Stroke in her mother; Sudden death in her father. There is no history of Diabetes or Hyperlipidemia.She reports that she has never smoked. She has never used smokeless tobacco. She reports that she does not drink alcohol. Her drug history is not on file.  Current Outpatient Prescriptions on File Prior to Visit  Medication Sig  Dispense Refill  . amphetamine-dextroamphetamine (ADDERALL) 10 MG tablet Take 10 mg by mouth 2 (two) times daily.     . metaxalone (SKELAXIN) 800 MG tablet   0  . NUCYNTA 50 MG TABS tablet   0  . SUMAtriptan (IMITREX) 100 MG tablet Take one at onset of headache.  May repeat in 2 hours if headache persists or recurs. 30 tablet 3   No current facility-administered medications on file prior to visit.     Objective:  Objective Physical Exam  Constitutional: She is oriented to person, place, and time. She appears well-developed and well-nourished.  HENT:  Head: Normocephalic and atraumatic.  Eyes: Conjunctivae and EOM are normal.  Neck: Normal range of motion. Neck supple. No JVD present. Carotid bruit is not present. No thyromegaly present.  Cardiovascular: Normal rate, regular rhythm and normal heart sounds.   No murmur heard. Pulmonary/Chest: Effort normal and breath sounds normal. No respiratory distress. She has no wheezes. She has no rales. She exhibits no tenderness.  Musculoskeletal: She exhibits no edema.  Neurological: She is alert and oriented to person, place, and time.  Psychiatric: She has a normal mood and affect. Her behavior is normal.   BP 98/60 mmHg  Pulse 70  Temp(Src) 97.9 F (36.6 C) (Oral)  Ht 5\' 10"  (1.778 m)  Wt 124 lb 6.4 oz (56.427 kg)  BMI 17.85 kg/m2  SpO2 98% Wt Readings from Last 3 Encounters:  05/10/15 124 lb 6.4 oz (56.427 kg)  11/15/14 127 lb (57.607 kg)  09/22/14  128 lb (58.06 kg)     Lab Results  Component Value Date   WBC 6.1 09/22/2014   HGB 13.9 09/22/2014   HCT 41.0 09/22/2014   PLT 302 09/22/2014   GLUCOSE 97 09/22/2014   CHOL 140 09/22/2014   TRIG 81 09/22/2014   HDL 63 09/22/2014   LDLCALC 61 09/22/2014   ALT 21 09/22/2014   AST 22 09/22/2014   NA 139 09/22/2014   K 4.1 09/22/2014   CL 102 09/22/2014   CREATININE 0.77 09/22/2014   BUN 15 09/22/2014   CO2 30 09/22/2014   TSH 0.581 09/22/2014    Dg Bone  Density  09/10/2014  EXAM: DXA BONE DENSITY STUDY The Bone [facility]eral Densitometry hard-copy report (which includes all data, graphical display, and FRAX results when applicable) has been sent directly to the ordering physician. This report can also be obtained electronically by viewing images for this exam through the performing facility's EMR, or by logging directly into [employer]. Electronically Signed   By: Earle Gell M.D.   On: 09/10/2014 09:14   Mm Screening Breast Tomo Bilateral  09/13/2014  CLINICAL DATA:  Screening. EXAM: DIGITAL SCREENING BILATERAL MAMMOGRAM WITH 3D TOMO WITH CAD COMPARISON:  Previous exam(s). ACR Breast Density Category c: The breast tissue is heterogeneously dense, which may obscure small masses. FINDINGS: There are no findings suspicious for malignancy. Images were processed with CAD. IMPRESSION: No mammographic evidence of malignancy. A result letter of this screening mammogram will be mailed directly to the patient. RECOMMENDATION: Screening mammogram in one year. (Code:SM-B-01Y) BI-RADS CATEGORY  1: Negative. Electronically Signed   By: Marin Olp M.D.   On: 09/13/2014 08:35     Assessment & Plan:  Plan I have discontinued Tracy Cook's alendronate, cephALEXin, and FORTEO. I am also having her maintain her amphetamine-dextroamphetamine, metaxalone, NUCYNTA, SUMAtriptan, tiZANidine, and propranolol.  Meds ordered this encounter  Medications  . DISCONTD: FORTEO 600 MCG/2.4ML SOLN    Sig:     Refill:  0  . tiZANidine (ZANAFLEX) 4 MG tablet    Sig:   . propranolol (INDERAL) 20 MG tablet    Sig: Take 1 tablet (20 mg total) by mouth 3 (three) times daily.    Dispense:  90 tablet    Refill:  11    Problem List Items Addressed This Visit    POTS (postural orthostatic tachycardia syndrome) - Primary   Relevant Medications   propranolol (INDERAL) 20 MG tablet   Migraine    Refer to neuro Refill meds Pt is on propranolol for pots----- it does not help the  migraines      Relevant Medications   tiZANidine (ZANAFLEX) 4 MG tablet   propranolol (INDERAL) 20 MG tablet   Other Relevant Orders   Ambulatory referral to Neurology    Other Visit Diagnoses    Cardiomyopathy ([place])        Relevant Medications    propranolol (INDERAL) 20 MG tablet    Migraine without aura and without status migrainosus, not intractable        Relevant Medications    tiZANidine (ZANAFLEX) 4 MG tablet    propranolol (INDERAL) 20 MG tablet    Other Relevant Orders    Ambulatory referral to Neurology       Follow-up: Return in about 6 months (around 11/08/2015), or if symptoms worsen or fail to improve, for annual exam, fasting.  Garnet Koyanagi, DO

## 2015-05-10 NOTE — Patient Instructions (Signed)
Postural Orthostatic Tachycardia Syndrome Postural orthostatic tachycardia syndrome (POTS) is an increased heart rate when going from a lying (supine) position to a standing position. The heart rate may increase more than 30 beats per minute (BPM) above its resting rate when going from a lying to a standing position. POTS occurs more frequently in women than in men.  SYMPTOMS  POTS symptoms may be increased in the morning. Symptoms of POTS include:  Fainting or near fainting.  Inability to think clearly.  Extreme or chronic fatigue.  Exercise intolerance.  Chest pain.  Having the lower legs develop a reddish-blue color due to decreased blood flow (acrocyanosis). CAUSES POTS can be caused by different conditions. Sometimes, it has no known cause (idiopathic). Some causes of POTS include:  Viral illness.  Pregnancy.  Autoimmune diseases.  Medications.  Major surgery.  Trauma such as a car accident or major injury.  Medical conditions such as anemia, dehydration, and hyperthyroidism. DIAGNOSIS  POTS is diagnosed by:  Taking a complete history and physical exam.  Measuring the heart rate while lying and then upon standing.  Measuring blood pressure when going from a lying to a standing position. POTS is usually not associated with low blood pressure (orthostatic hypotension) when going from a lying to standing position. While standing, blood pressure should be taken 2, 5, and 10 minutes after getting up. TREATMENT  Treatment of POTS depends upon the severity of the symptoms. Treatment includes:  Drinking plenty of fluids to avoid getting dehydrated.  Avoiding very hot environments to not get overheated.  Increasing your dietary salt intake as instructed by your caregiver.  Taking different types of medications as prescribed for POTS.  Avoiding some classes of medications such as vasodilators and diuretics. SEEK IMMEDIATE MEDICAL CARE IF  You have severe chest pain  that does not go away. Call your local emergency service immediately.  You feel your heart racing or beating rapidly.  You feel like passing out.  You have very confused thinking. MAKE SURE YOU  Understand these instructions.  Will watch your condition.  Will get help right away if you are not doing well or get worse.   This information is not intended to replace advice given to you by your health care provider. Make sure you discuss any questions you have with your health care provider.   Document Released: 06/29/2002 Document Revised: 07/30/2014 Document Reviewed: 09/06/2010 Elsevier Interactive Patient Education [employer].

## 2015-05-10 NOTE — Progress Notes (Signed)
Pre visit review using our clinic review tool, if applicable. No additional management support is needed unless otherwise documented below in the visit note. 

## 2015-06-27 ENCOUNTER — Ambulatory Visit: Payer: BC Managed Care – PPO | Admitting: Neurology

## 2015-08-05 MED FILL — SUMATRIPTAN SUCC 100 MG TAB: 100 | 20 days supply | Qty: 12 | Fill #6

## 2015-08-05 MED FILL — PROPRANOLOL 20 MG TABLET: 20 | 30 days supply | Qty: 90 | Fill #2

## 2015-08-19 ENCOUNTER — Encounter: Payer: Self-pay | Admitting: Neurology

## 2015-08-19 ENCOUNTER — Ambulatory Visit (INDEPENDENT_AMBULATORY_CARE_PROVIDER_SITE_OTHER): Payer: BLUE CROSS/BLUE SHIELD | Admitting: Neurology

## 2015-08-19 VITALS — BP 112/64 | HR 77 | Ht 70.0 in | Wt 130.0 lb

## 2015-08-19 DIAGNOSIS — G43709 Chronic migraine without aura, not intractable, without status migrainosus: Secondary | ICD-10-CM

## 2015-08-19 MED ORDER — SUMATRIPTAN SUCCINATE 100 MG PO TABS: ORAL_TABLET | ORAL | Status: DC

## 2015-08-19 MED ORDER — TOPIRAMATE 25 MG PO TABS: 25.0000 mg | ORAL_TABLET | Freq: Every day | ORAL | Status: DC

## 2015-08-19 NOTE — Progress Notes (Signed)
NEUROLOGY CONSULTATION NOTE  Tracy Cook MRN: [id number] DOB: [birth year]  Referring provider: Dr. Etter Sjogren Primary care provider: Dr. Etter Sjogren  Reason for consult:  migraine  HISTORY OF PRESENT ILLNESS: Tracy Cook is a 60 year old right-handed female with POTS who presents for headache.  History obtained by patient and PCP note.  Onset:  "almost all of my life" Location:  Bi-frontal/temporal, right retro-orbital Quality:  pounding Intensity:  7/10 Aura:  no Prodrome:  no Associated symptoms:  Nausea, photophobia, phonophobia, osmophobia, rarely vomiting Duration:  Several hours (1 hour with sumatriptan) Frequency:  3 to 4 days per week Triggers/exacerbating factors:  Light, anxiety, narcotics Relieving factors:  sumatriptan Activity:  Usually can function  Past NSAIDS:  Ibuprofen, naproxen Past analgesics:  Tylenol, Excedrin Past abortive triptans:  Maxalt (effective) Past muscle relaxants:  Skelaxin Past anti-nausea:  none Past antihypertensive medications:  none Past antidepressant medications:  none Past anticonvulsant medications:  none Past vitamins/Herbal/Supplements:  none Past antihistamines/decongestants:  Benadryl  Current NSAIDS:  none Current analgesics:  none Current triptans:  sumatriptan 100mg  Current anti-nausea:  none Current muscle relaxants:  Tizanidine 4mg  Antihypertensive medications:  propranolol 20mg  three times daily Antidepressant medications:  none Anticonvulsant medications:  none Vitamins/Herbal/Supplements:  Magnesium 400mg  Antihistamines/Decongestants:  none  Caffeine:  daily Alcohol:  no Smoker:  no Diet:  Does not keep hydrated Exercise:  routine Depression/stress:  anxiety Sleep hygiene:  Varies  She has history of cardiomyopathy, but cardiology noted that LV function mostly normal on last MUGA.   PAST MEDICAL HISTORY: Past Medical History  Diagnosis Date  . Tachycardia, unspecified   . Other malaise and fatigue    . Migraine   . POTS (postural orthostatic tachycardia syndrome)   . Persistent disorder of initiating or maintaining sleep   . Orthostatic hypotension   . Insomnia   . Menopause   . Cardiomyopathy ([place])   . Osteoporosis     PAST SURGICAL HISTORY: History reviewed. No pertinent past surgical history.  MEDICATIONS: Current Outpatient Prescriptions on File Prior to Visit  Medication Sig Dispense Refill  . amphetamine-dextroamphetamine (ADDERALL) 10 MG tablet Take 10 mg by mouth 2 (two) times daily.     . NUCYNTA 50 MG TABS tablet Reported on 08/19/2015  0  . propranolol (INDERAL) 20 MG tablet Take 1 tablet (20 mg total) by mouth 3 (three) times daily. 90 tablet 11  . tiZANidine (ZANAFLEX) 4 MG tablet     . metaxalone (SKELAXIN) 800 MG tablet Reported on 08/19/2015  0   No current facility-administered medications on file prior to visit.    ALLERGIES: Allergies  Allergen Reactions  . Vicodin [Hydrocodone-Acetaminophen] Nausea And Vomiting  . Codeine Nausea And Vomiting    FAMILY HISTORY: Family History  Problem Relation Age of Onset  . Coronary artery disease Father     Died of MI at age 81  . Asthma Father   . Hypertension Father   . Sudden death Father   . Heart attack Father   . Stroke Mother   . Diabetes Neg Hx   . Hyperlipidemia Neg Hx     SOCIAL HISTORY: Social History   Social History  . Marital Status: Married    Spouse Name: N/A  . Number of Children: 2  . Years of Education: N/A   Occupational History  .      Medial lab technologist   Social History Main Topics  . Smoking status: Never Smoker   . Smokeless tobacco: Never Used  .  Alcohol Use: No  . Drug Use: Not on file  . Sexual Activity:    Partners: Male   Other Topics Concern  . Not on file   Social History Narrative    REVIEW OF SYSTEMS: Constitutional: No fevers, chills, or sweats, no generalized fatigue, change in appetite Eyes: No visual changes, double vision, eye pain Ear,  nose and throat: No hearing loss, ear pain, nasal congestion, sore throat Cardiovascular: No chest pain, palpitations Respiratory:  No shortness of breath at rest or with exertion, wheezes GastrointestinaI: No nausea, vomiting, diarrhea, abdominal pain, fecal incontinence Genitourinary:  No dysuria, urinary retention or frequency Musculoskeletal:  No neck pain, back pain Integumentary: No rash, pruritus, skin lesions Neurological: as above Psychiatric: No depression, insomnia, anxiety Endocrine: No palpitations, fatigue, diaphoresis, mood swings, change in appetite, change in weight, increased thirst Hematologic/Lymphatic:  No anemia, purpura, petechiae. Allergic/Immunologic: no itchy/runny eyes, nasal congestion, recent allergic reactions, rashes  PHYSICAL EXAM: Filed Vitals:   08/19/15 0956  BP: 112/64  Pulse: 77   General: No acute distress.  Patient appears well-groomed.  Head:  Normocephalic/atraumatic Eyes:  fundi unremarkable, without vessel changes, exudates, hemorrhages or papilledema. Neck: supple, no paraspinal tenderness, full range of motion Back: No paraspinal tenderness Heart: regular rate and rhythm Lungs: Clear to auscultation bilaterally. Vascular: No carotid bruits. Neurological Exam: Mental status: alert and oriented to person, place, and time, recent and remote memory intact, fund of knowledge intact, attention and concentration intact, speech fluent and not dysarthric, language intact. Cranial nerves: CN I: not tested CN II: pupils equal, round and reactive to light, visual fields intact, fundi unremarkable, without vessel changes, exudates, hemorrhages or papilledema. CN III, IV, VI:  full range of motion, no nystagmus, no ptosis CN V: facial sensation intact CN VII: upper and lower face symmetric CN VIII: hearing intact CN IX, X: gag intact, uvula midline CN XI: sternocleidomastoid and trapezius muscles intact CN XII: tongue midline Bulk & Tone: normal,  no fasciculations. Motor:  5/5 throughout Sensation: temperature and vibration sensation intact. Deep Tendon Reflexes:  2+ throughout, toes downgoing.  Finger to nose testing:  Without dysmetria.  Heel to shin:  Without dysmetria.  Gait:  Normal station and stride.  Able to turn and tandem walk. Romberg negative.  IMPRESSION: Chronic migraine without aura  PLAN: 1.  Start topiramate 25mg  at bedtime.  She is to contact us in 4 weeks with update 2.  Continue sumatriptan 100mg  (limit pain relievers to no more than 2 days out of the week) 3.  Sleep hygiene, stop caffeine, increase water intake 4.  Follow up approx 3 months  45 minutes spent face to face with patient, over 50% spent discussing diagnosis and management.  Thank you for allowing me to take part in the care of this patient.  Metta Clines, DO  CC:  Garnet Koyanagi, DO

## 2015-08-19 NOTE — Patient Instructions (Signed)
Migraine Recommendations: 1.  Start topiramate 25mg  at bedtime. Possible side effects include: impaired thinking, sedation, paresthesias (numbness and tingling) and weight loss.  It may cause dehydration and there is a small risk for kidney stones, so make sure to stay hydrated with water during the day.  There is also a very small risk for glaucoma, so if you notice any change in your vision while taking this medication, see an ophthalmologist.  CALL/MESSAGE ME IN 4 WEEKS WITH UPDATE  2.  Take sumatriptan 100mg  at earliest onset of headache.  May repeat dose once in 2 hours if needed.  Do not exceed two tablets in 24 hours. 3.  Limit use of pain relievers to no more than 2 days out of the week.  These medications include acetaminophen, ibuprofen, triptans and narcotics.  This will help reduce risk of rebound headaches. 4.  Be aware of common food triggers such as processed sweets, processed foods with nitrites (such as deli meat, hot dogs, sausages), foods with MSG, alcohol (such as wine), chocolate, certain cheeses, certain fruits (dried fruits, some citrus fruit), vinegar, diet soda. 4.  Avoid caffeine 5.  Routine exercise 6.  Proper sleep hygiene 7.  Stay adequately hydrated with water 8.  Keep a headache diary. 9.  Maintain proper stress management. 10.  Do not skip meals. 11.  Consider supplements:  Magnesium oxide 400mg  to 600mg  daily, riboflavin 400mg , Coenzyme Q 10 100mg  three times daily

## 2015-09-15 ENCOUNTER — Telehealth: Payer: Self-pay | Admitting: Neurology

## 2015-09-15 MED ORDER — RIZATRIPTAN BENZOATE 10 MG PO TBDP: 10.0000 mg | ORAL_TABLET | ORAL | Status: DC | PRN

## 2015-09-15 MED ORDER — TOPIRAMATE 50 MG PO TABS: 50.0000 mg | ORAL_TABLET | Freq: Every day | ORAL | Status: DC

## 2015-09-15 NOTE — Telephone Encounter (Signed)
Increase topiramate to 50mg  at bedtime.  We can prescribe her Maxalt 10mg  (1 tablet at earliest onset of headache and then may repeat once in 2 hours if needed).  She should call in 4 weeks with update and we can adjust dose further if needed.

## 2015-09-15 NOTE — Telephone Encounter (Signed)
Pt states that she is still having headaches please call her at [phone] she said we may need to call in a different dosage or [place] her old medication please call her

## 2015-09-15 NOTE — Telephone Encounter (Signed)
Message relayed to patient. Verbalized understanding and denied questions.   

## 2015-09-15 NOTE — Telephone Encounter (Signed)
Spoke with patient. She can tell no difference on the Topamax. Frequency still about the same. Patient has used all 9 doses of her Imitrex in last 3 weeks. Has a headache today, would like to know if you would call in Maxalt another medication for her today for todays headache. Pt states she is using pain relieves about 3 times a week for migraines. Please advise.

## 2015-10-11 MED FILL — PROPRANOLOL 20 MG TABLET: 20 | 30 days supply | Qty: 90 | Fill #3

## 2015-11-07 MED FILL — PROPRANOLOL 20 MG TABLET: 20 | 30 days supply | Qty: 90 | Fill #4

## 2015-11-09 ENCOUNTER — Telehealth: Payer: Self-pay | Admitting: *Deleted

## 2015-11-09 ENCOUNTER — Encounter: Payer: Self-pay | Admitting: *Deleted

## 2015-11-09 NOTE — Telephone Encounter (Signed)
Pre-Visit Call completed with patient and chart updated.   Pre-Visit Info documented in Specialty Comments under [employer].    

## 2015-11-10 ENCOUNTER — Encounter: Payer: Self-pay | Admitting: Family Medicine

## 2015-11-10 ENCOUNTER — Ambulatory Visit (INDEPENDENT_AMBULATORY_CARE_PROVIDER_SITE_OTHER): Payer: BLUE CROSS/BLUE SHIELD | Admitting: Family Medicine

## 2015-11-10 ENCOUNTER — Other Ambulatory Visit (HOSPITAL_COMMUNITY)
Admission: RE | Admit: 2015-11-10 | Discharge: 2015-11-10 | Disposition: A | Discharge status: Home / Self Care | Payer: BLUE CROSS/BLUE SHIELD | Source: Ambulatory Visit | Attending: Family Medicine | Admitting: Family Medicine

## 2015-11-10 VITALS — BP 110/68 | HR 66 | Ht 70.0 in | Wt 125.0 lb

## 2015-11-10 DIAGNOSIS — Z113 Encounter for screening for infections with a predominantly sexual mode of transmission: Secondary | ICD-10-CM | POA: Insufficient documentation

## 2015-11-10 DIAGNOSIS — Z1151 Encounter for screening for human papillomavirus (HPV): Secondary | ICD-10-CM | POA: Diagnosis not present

## 2015-11-10 DIAGNOSIS — Z0000 Encounter for general adult medical examination without abnormal findings: Secondary | ICD-10-CM

## 2015-11-10 DIAGNOSIS — R87610 Atypical squamous cells of undetermined significance on cytologic smear of cervix (ASC-US): Secondary | ICD-10-CM | POA: Diagnosis not present

## 2015-11-10 DIAGNOSIS — Z01411 Encounter for gynecological examination (general) (routine) with abnormal findings: Secondary | ICD-10-CM | POA: Insufficient documentation

## 2015-11-10 DIAGNOSIS — Z86010 Personal history of colonic polyps: Secondary | ICD-10-CM | POA: Diagnosis not present

## 2015-11-10 DIAGNOSIS — D229 Melanocytic nevi, unspecified: Secondary | ICD-10-CM

## 2015-11-10 DIAGNOSIS — N760 Acute vaginitis: Secondary | ICD-10-CM | POA: Diagnosis not present

## 2015-11-10 DIAGNOSIS — M810 Age-related osteoporosis without current pathological fracture: Secondary | ICD-10-CM

## 2015-11-10 DIAGNOSIS — Z1159 Encounter for screening for other viral diseases: Secondary | ICD-10-CM | POA: Diagnosis not present

## 2015-11-10 DIAGNOSIS — Z124 Encounter for screening for malignant neoplasm of cervix: Secondary | ICD-10-CM

## 2015-11-10 LAB — LIPID PANEL
Cholesterol: 136 mg/dL (ref 0–200)
HDL: 59.1 mg/dL (ref >39.00)
LDL Cholesterol: 62 mg/dL (ref 0–99)
NonHDL: 76.75
Total CHOL/HDL Ratio: 2
Triglycerides: 75 mg/dL (ref 0.0–149.0)
VLDL: 15 mg/dL (ref 0.0–40.0)

## 2015-11-10 LAB — COMPREHENSIVE METABOLIC PANEL
ALT: 17 U/L (ref 0–35)
AST: 19 U/L (ref 0–37)
Albumin: 4.3 g/dL (ref 3.5–5.2)
Alkaline Phosphatase: 50 U/L (ref 39–117)
BUN: 13 mg/dL (ref 6–23)
CO2: 29 mEq/L (ref 19–32)
Calcium: 9.2 mg/dL (ref 8.4–10.5)
Chloride: 106 mEq/L (ref 96–112)
Creatinine, Ser: 0.82 mg/dL (ref 0.40–1.20)
GFR: 75.92 mL/min (ref >60.00)
Glucose, Bld: 87 mg/dL (ref 70–99)
Potassium: 4.2 mEq/L (ref 3.5–5.1)
Sodium: 138 mEq/L (ref 135–145)
Total Bilirubin: 0.4 mg/dL (ref 0.2–1.2)
Total Protein: 6.9 g/dL (ref 6.0–8.3)

## 2015-11-10 LAB — CBC WITH DIFFERENTIAL/PLATELET
Basophils Absolute: 0.1 10*3/uL (ref 0.0–0.1)
Basophils Relative: 1.3 % (ref 0.0–3.0)
Eosinophils Absolute: 0.1 10*3/uL (ref 0.0–0.7)
Eosinophils Relative: 1.3 % (ref 0.0–5.0)
HCT: 41.5 % (ref 36.0–46.0)
Hemoglobin: 14.2 g/dL (ref 12.0–15.0)
Lymphocytes Relative: 34.8 % (ref 12.0–46.0)
Lymphs Abs: 1.8 10*3/uL (ref 0.7–4.0)
MCHC: 34.2 g/dL (ref 30.0–36.0)
MCV: 89.2 fl (ref 78.0–100.0)
Monocytes Absolute: 0.4 10*3/uL (ref 0.1–1.0)
Monocytes Relative: 7.9 % (ref 3.0–12.0)
Neutro Abs: 2.8 10*3/uL (ref 1.4–7.7)
Neutrophils Relative %: 54.7 % (ref 43.0–77.0)
Platelets: 261 10*3/uL (ref 150.0–400.0)
RBC: 4.65 Mil/uL (ref 3.87–5.11)
RDW: 13.9 % (ref 11.5–15.5)
WBC: 5.2 10*3/uL (ref 4.0–10.5)

## 2015-11-10 LAB — POCT URINALYSIS DIPSTICK
Bilirubin, UA: NEGATIVE
Blood, UA: NEGATIVE
Glucose, UA: NEGATIVE
Ketones, UA: NEGATIVE
Leukocytes, UA: NEGATIVE
Nitrite, UA: NEGATIVE
Protein, UA: NEGATIVE
Spec Grav, UA: 1.01
Urobilinogen, UA: 0.2
pH, UA: 7

## 2015-11-10 LAB — TSH: TSH: 0.69 u[IU]/mL (ref 0.35–4.50)

## 2015-11-10 LAB — HEPATITIS C ANTIBODY: HCV Ab: NEGATIVE

## 2015-11-10 MED ORDER — ALENDRONATE SODIUM 70 MG PO TABS: 70.0000 mg | ORAL_TABLET | ORAL | Status: DC

## 2015-11-10 NOTE — Progress Notes (Signed)
Subjective:     Tracy Cook is a 60 y.o. female and is here for a comprehensive physical exam. The patient reports vaginal d/c and odor -- she treated herself with flagyl she got on line.   She is also c/o muscle aches from prolia.  Social History   Social History  . Marital Status: Married    Spouse Name: N/A  . Number of Children: 2  . Years of Education: N/A   Occupational History  .      Medial lab technologist   Social History Main Topics  . Smoking status: Never Smoker   . Smokeless tobacco: Never Used  . Alcohol Use: No  . Drug Use: Not on file  . Sexual Activity:    Partners: Male   Other Topics Concern  . Not on file   Social History Narrative   Health Maintenance  Topic Date Due  . Hepatitis C Screening  08-09-1955  . COLONOSCOPY  04/10/2015  . PAP SMEAR  05/09/2015  . INFLUENZA VACCINE  02/21/2016  . MAMMOGRAM  09/10/2016  . TETANUS/TDAP  05/08/2022  . HIV Screening  Addressed    The following portions of the patient's history were reviewed and updated as appropriate:  She  has a past medical history of Tachycardia, unspecified; Other malaise and fatigue; Migraine; POTS (postural orthostatic tachycardia syndrome); Persistent disorder of initiating or maintaining sleep; Orthostatic hypotension; Insomnia; Menopause; Cardiomyopathy ([place]); and Osteoporosis. She  does not have any pertinent problems on file. She  has no past surgical history on file. Her family history includes Asthma in her father; Coronary artery disease in her father; Heart attack in her father; Hypertension in her father; Stroke in her mother; Sudden death in her father. There is no history of Diabetes or Hyperlipidemia. She  reports that she has never smoked. She has never used smokeless tobacco. She reports that she does not drink alcohol. Her drug history is not on file. She has a current medication list which includes the following prescription(s): amphetamine-dextroamphetamine,  escitalopram, propranolol, rizatriptan, sumatriptan, tizanidine, and topiramate. Current Outpatient Prescriptions on File Prior to Visit  Medication Sig Dispense Refill  . amphetamine-dextroamphetamine (ADDERALL) 10 MG tablet Take 10 mg by mouth 2 (two) times daily.     Marland Kitchen escitalopram (LEXAPRO) 5 MG tablet Take 5 mg by mouth daily.  1  . propranolol (INDERAL) 20 MG tablet Take 1 tablet (20 mg total) by mouth 3 (three) times daily. 90 tablet 11  . rizatriptan (MAXALT-MLT) 10 MG disintegrating tablet Take 1 tablet (10 mg total) by mouth as needed for migraine. May repeat in 2 hours if needed 9 tablet 1  . SUMAtriptan (IMITREX) 100 MG tablet Take one at onset of headache.  May repeat once in 2 hours if headache persists or recurs.  Do not exceed 2 tablets in 24 hours 12 tablet 9  . tiZANidine (ZANAFLEX) 4 MG tablet Take 4 mg by mouth as needed.     . topiramate (TOPAMAX) 50 MG tablet Take 1 tablet (50 mg total) by mouth at bedtime. 30 tablet 1   No current facility-administered medications on file prior to visit.   She is allergic to vicodin and codeine..  Review of Systems Review of Systems  Constitutional: Negative for activity change, appetite change and fatigue.  HENT: Negative for hearing loss, congestion, tinnitus and ear discharge.  dentist q32m Eyes: Negative for visual disturbance (see optho q1y -- vision corrected to 20/20 with glasses).  Respiratory: Negative for  cough, chest tightness and shortness of breath.   Cardiovascular: Negative for chest pain, palpitations and leg swelling.  Gastrointestinal: Negative for abdominal pain, diarrhea, constipation and abdominal distention.  Genitourinary: Negative for urgency, frequency, decreased urine volume and difficulty urinating.  Musculoskeletal: Negative for back pain, arthralgias and gait problem.  Skin: Negative for color change, pallor and rash.  Neurological: Negative for dizziness, light-headedness, numbness and headaches.   Hematological: Negative for adenopathy. Does not bruise/bleed easily.  Psychiatric/Behavioral: Negative for suicidal ideas, confusion, sleep disturbance, self-injury, dysphoric mood, decreased concentration and agitation.       Objective:    BP 110/68 mmHg  Pulse 66  Ht 5\' 10"  (1.778 m)  Wt 125 lb (56.7 kg)  BMI 17.94 kg/m2  SpO2 99% General appearance: alert, cooperative, appears stated age and no distress Head: Normocephalic, without obvious abnormality, atraumatic Eyes: conjunctivae/corneas clear. PERRL, EOM's intact. Fundi benign. Ears: normal TM's and external ear canals both ears Nose: Nares normal. Septum midline. Mucosa normal. No drainage or sinus tenderness. Throat: lips, mucosa, and tongue normal; teeth and gums normal Neck: no adenopathy, no carotid bruit, no JVD, supple, symmetrical, trachea midline and thyroid not enlarged, symmetric, no tenderness/mass/nodules Back: symmetric, no curvature. ROM normal. No CVA tenderness. Lungs: clear to auscultation bilaterally Breasts: normal appearance, no masses or tenderness Heart: regular rate and rhythm, S1, S2 normal, no murmur, click, rub or gallop Abdomen: soft, non-tender; bowel sounds normal; no masses,  no organomegaly Pelvic: cervix normal in appearance, external genitalia normal, no adnexal masses or tenderness, no cervical motion tenderness, rectovaginal septum normal, uterus normal size, shape, and consistency, vagina normal without discharge and pap done, rectal heme neg brown stool Extremities: no edema, redness or tenderness in the calves or thighs, no ulcers, gangrene or trophic changes and venous stasis dermatitis noted Pulses: 2+ and symmetric Skin: rough spot upper lip--- scratches if off and it bleeds and comes back--she burned herself with home hair removal Lymph nodes: Cervical, supraclavicular, and axillary nodes normal. Neurologic: Alert and oriented X 3, normal strength and tone. Normal symmetric reflexes.  Normal coordination and gait Psych- no depression, no anxiety      Assessment:    Healthy female exam.      Plan:    ghm utd Check labs See After Visit Summary for Counseling Recommendations    1. Preventative health care   - Ambulatory referral to Gastroenterology - CBC with Differential/Platelet - Lipid panel - Comprehensive metabolic panel - POCT urinalysis dipstick - TSH  2. Need for hepatitis C screening test   - Hepatitis C antibody  3. History of colonic polyps   - Ambulatory referral to Gastroenterology  4. Numerous moles   - Ambulatory referral to Dermatology  5. Osteoporosis   - alendronate (FOSAMAX) 70 MG tablet; Take 1 tablet (70 mg total) by mouth every 7 (seven) days. Take with a full glass of water on an empty stomach.  Dispense: 12 tablet; Refill: 3  6. Screening for malignant neoplasm of cervix    - Cytology - PAP

## 2015-11-10 NOTE — Patient Instructions (Addendum)
Preventive Care for Adults, Female A healthy lifestyle and preventive care can promote health and wellness. Preventive health guidelines for women include the following key practices.  A routine yearly physical is a good way to check with your health care provider about your health and preventive screening. It is a chance to share any concerns and updates on your health and to receive a thorough exam.  Visit your dentist for a routine exam and preventive care every 6 months. Brush your teeth twice a day and floss once a day. Good oral hygiene prevents tooth decay and gum disease.  The frequency of eye exams is based on your age, health, family medical history, use of contact lenses, and other factors. Follow your health care provider's recommendations for frequency of eye exams.  Eat a healthy diet. Foods like vegetables, fruits, whole grains, low-fat dairy products, and lean protein foods contain the nutrients you need without too many calories. Decrease your intake of foods high in solid fats, added sugars, and salt. Eat the right amount of calories for you.Get information about a proper diet from your health care provider, if necessary.  Regular physical exercise is one of the most important things you can do for your health. Most adults should get at least 150 minutes of moderate-intensity exercise (any activity that increases your heart rate and causes you to sweat) each week. In addition, most adults need muscle-strengthening exercises on 2 or more days a week.  Maintain a healthy weight. The body mass index (BMI) is a screening tool to identify possible weight problems. It provides an estimate of body fat based on height and weight. Your health care provider can find your BMI and can help you achieve or maintain a healthy weight.For adults 20 years and older:  A BMI below 18.5 is considered underweight.  A BMI of 18.5 to 24.9 is normal.  A BMI of 25 to 29.9 is considered overweight.  A  BMI of 30 and above is considered obese.  Maintain normal blood lipids and cholesterol levels by exercising and minimizing your intake of saturated fat. Eat a balanced diet with plenty of fruit and vegetables. Blood tests for lipids and cholesterol should begin at age 45 and be repeated every 5 years. If your lipid or cholesterol levels are high, you are over 50, or you are at high risk for heart disease, you may need your cholesterol levels checked more frequently.Ongoing high lipid and cholesterol levels should be treated with medicines if diet and exercise are not working.  If you smoke, find out from your health care provider how to quit. If you do not use tobacco, do not start.  Lung cancer screening is recommended for adults aged 45-80 years who are at high risk for developing lung cancer because of a history of smoking. A yearly low-dose CT scan of the lungs is recommended for people who have at least a 30-pack-year history of smoking and are a current smoker or have quit within the past 15 years. A pack year of smoking is smoking an average of 1 pack of cigarettes a day for 1 year (for example: 1 pack a day for 30 years or 2 packs a day for 15 years). Yearly screening should continue until the smoker has stopped smoking for at least 15 years. Yearly screening should be stopped for people who develop a health problem that would prevent them from having lung cancer treatment.  If you are pregnant, do not drink alcohol. If you are  breastfeeding, be very cautious about drinking alcohol. If you are not pregnant and choose to drink alcohol, do not have more than 1 drink per day. One drink is considered to be 12 ounces (355 mL) of beer, 5 ounces (148 mL) of wine, or 1.5 ounces (44 mL) of liquor.  Avoid use of street drugs. Do not share needles with anyone. Ask for help if you need support or instructions about stopping the use of drugs.  High blood pressure causes heart disease and increases the risk  of stroke. Your blood pressure should be checked at least every 1 to 2 years. Ongoing high blood pressure should be treated with medicines if weight loss and exercise do not work.  If you are 55-79 years old, ask your health care provider if you should take aspirin to prevent strokes.  Diabetes screening is done by taking a blood sample to check your blood glucose level after you have not eaten for a certain period of time (fasting). If you are not overweight and you do not have risk factors for diabetes, you should be screened once every 3 years starting at age 45. If you are overweight or obese and you are 40-70 years of age, you should be screened for diabetes every year as part of your cardiovascular risk assessment.  Breast cancer screening is essential preventive care for women. You should practice "breast self-awareness." This means understanding the normal appearance and feel of your breasts and may include breast self-examination. Any changes detected, no matter how small, should be reported to a health care provider. Women in their 20s and 30s should have a clinical breast exam (CBE) by a health care provider as part of a regular health exam every 1 to 3 years. After age 40, women should have a CBE every year. Starting at age 40, women should consider having a mammogram (breast X-ray test) every year. Women who have a family history of breast cancer should talk to their health care provider about genetic screening. Women at a high risk of breast cancer should talk to their health care providers about having an MRI and a mammogram every year.  Breast cancer gene (BRCA)-related cancer risk assessment is recommended for women who have family members with BRCA-related cancers. BRCA-related cancers include breast, ovarian, tubal, and peritoneal cancers. Having family members with these cancers may be associated with an increased risk for harmful changes (mutations) in the breast cancer genes BRCA1 and  BRCA2. Results of the assessment will determine the need for genetic counseling and BRCA1 and BRCA2 testing.  Your health care provider may recommend that you be screened regularly for cancer of the pelvic organs (ovaries, uterus, and vagina). This screening involves a pelvic examination, including checking for microscopic changes to the surface of your cervix (Pap test). You may be encouraged to have this screening done every 3 years, beginning at age 21.  For women ages 30-65, health care providers may recommend pelvic exams and Pap testing every 3 years, or they may recommend the Pap and pelvic exam, combined with testing for human papilloma virus (HPV), every 5 years. Some types of HPV increase your risk of cervical cancer. Testing for HPV may also be done on women of any age with unclear Pap test results.  Other health care providers may not recommend any screening for nonpregnant women who are considered low risk for pelvic cancer and who do not have symptoms. Ask your health care provider if a screening pelvic exam is right for   you.  If you have had past treatment for cervical cancer or a condition that could lead to cancer, you need Pap tests and screening for cancer for at least 20 years after your treatment. If Pap tests have been discontinued, your risk factors (such as having a new sexual partner) need to be reassessed to determine if screening should resume. Some women have medical problems that increase the chance of getting cervical cancer. In these cases, your health care provider may recommend more frequent screening and Pap tests.  Colorectal cancer can be detected and often prevented. Most routine colorectal cancer screening begins at the age of 50 years and continues through age 75 years. However, your health care provider may recommend screening at an earlier age if you have risk factors for colon cancer. On a yearly basis, your health care provider may provide home test kits to check  for hidden blood in the stool. Use of a small camera at the end of a tube, to directly examine the colon (sigmoidoscopy or colonoscopy), can detect the earliest forms of colorectal cancer. Talk to your health care provider about this at age 50, when routine screening begins. Direct exam of the colon should be repeated every 5-10 years through age 75 years, unless early forms of precancerous polyps or small growths are found.  People who are at an increased risk for hepatitis B should be screened for this virus. You are considered at high risk for hepatitis B if:  You were born in a country where hepatitis B occurs often. Talk with your health care provider about which countries are considered high risk.  Your parents were born in a high-risk country and you have not received a shot to protect against hepatitis B (hepatitis B vaccine).  You have HIV or AIDS.  You use needles to inject street drugs.  You live with, or have sex with, someone who has hepatitis B.  You get hemodialysis treatment.  You take certain medicines for conditions like cancer, organ transplantation, and autoimmune conditions.  Hepatitis C blood testing is recommended for all people born from 1945 through 1965 and any individual with known risks for hepatitis C.  Practice safe sex. Use condoms and avoid high-risk sexual practices to reduce the spread of sexually transmitted infections (STIs). STIs include gonorrhea, chlamydia, syphilis, trichomonas, herpes, HPV, and human immunodeficiency virus (HIV). Herpes, HIV, and HPV are viral illnesses that have no cure. They can result in disability, cancer, and death.  You should be screened for sexually transmitted illnesses (STIs) including gonorrhea and chlamydia if:  You are sexually active and are younger than 24 years.  You are older than 24 years and your health care provider tells you that you are at risk for this type of infection.  Your sexual activity has changed  since you were last screened and you are at an increased risk for chlamydia or gonorrhea. Ask your health care provider if you are at risk.  If you are at risk of being infected with HIV, it is recommended that you take a prescription medicine daily to prevent HIV infection. This is called preexposure prophylaxis (PrEP). You are considered at risk if:  You are sexually active and do not regularly use condoms or know the HIV status of your partner(s).  You take drugs by injection.  You are sexually active with a partner who has HIV.  Talk with your health care provider about whether you are at high risk of being infected with HIV. If   you choose to begin PrEP, you should first be tested for HIV. You should then be tested every 3 months for as long as you are taking PrEP.  Osteoporosis is a disease in which the bones lose minerals and strength with aging. This can result in serious bone fractures or breaks. The risk of osteoporosis can be identified using a bone density scan. Women ages 67 years and over and women at risk for fractures or osteoporosis should discuss screening with their health care providers. Ask your health care provider whether you should take a calcium supplement or vitamin D to reduce the rate of osteoporosis.  Menopause can be associated with physical symptoms and risks. Hormone replacement therapy is available to decrease symptoms and risks. You should talk to your health care provider about whether hormone replacement therapy is right for you.  Use sunscreen. Apply sunscreen liberally and repeatedly throughout the day. You should seek shade when your shadow is shorter than you. Protect yourself by wearing long sleeves, pants, a wide-brimmed hat, and sunglasses year round, whenever you are outdoors.  Once a month, do a whole body skin exam, using a mirror to look at the skin on your back. Tell your health care provider of new moles, moles that have irregular borders, moles that  are larger than a pencil eraser, or moles that have changed in shape or color.  Stay current with required vaccines (immunizations).  Influenza vaccine. All adults should be immunized every year.  Tetanus, diphtheria, and acellular pertussis (Td, Tdap) vaccine. Pregnant women should receive 1 dose of Tdap vaccine during each pregnancy. The dose should be obtained regardless of the length of time since the last dose. Immunization is preferred during the 27th-36th week of gestation. An adult who has not previously received Tdap or who does not know her vaccine status should receive 1 dose of Tdap. This initial dose should be followed by tetanus and diphtheria toxoids (Td) booster doses every 10 years. Adults with an unknown or incomplete history of completing a 3-dose immunization series with Td-containing vaccines should begin or complete a primary immunization series including a Tdap dose. Adults should receive a Td booster every 10 years.  Varicella vaccine. An adult without evidence of immunity to varicella should receive 2 doses or a second dose if she has previously received 1 dose. Pregnant females who do not have evidence of immunity should receive the first dose after pregnancy. This first dose should be obtained before leaving the health care facility. The second dose should be obtained 4-8 weeks after the first dose.  Human papillomavirus (HPV) vaccine. Females aged 13-26 years who have not received the vaccine previously should obtain the 3-dose series. The vaccine is not recommended for use in pregnant females. However, pregnancy testing is not needed before receiving a dose. If a female is found to be pregnant after receiving a dose, no treatment is needed. In that case, the remaining doses should be delayed until after the pregnancy. Immunization is recommended for any person with an immunocompromised condition through the age of 61 years if she did not get any or all doses earlier. During the  3-dose series, the second dose should be obtained 4-8 weeks after the first dose. The third dose should be obtained 24 weeks after the first dose and 16 weeks after the second dose.  Zoster vaccine. One dose is recommended for adults aged 30 years or older unless certain conditions are present.  Measles, mumps, and rubella (MMR) vaccine. Adults born  before 1957 generally are considered immune to measles and mumps. Adults born in 1957 or later should have 1 or more doses of MMR vaccine unless there is a contraindication to the vaccine or there is laboratory evidence of immunity to each of the three diseases. A routine second dose of MMR vaccine should be obtained at least 28 days after the first dose for students attending postsecondary schools, health care workers, or international travelers. People who received inactivated measles vaccine or an unknown type of measles vaccine during 1963-1967 should receive 2 doses of MMR vaccine. People who received inactivated mumps vaccine or an unknown type of mumps vaccine before 1979 and are at high risk for mumps infection should consider immunization with 2 doses of MMR vaccine. For females of childbearing age, rubella immunity should be determined. If there is no evidence of immunity, females who are not pregnant should be vaccinated. If there is no evidence of immunity, females who are pregnant should delay immunization until after pregnancy. Unvaccinated health care workers born before 1957 who lack laboratory evidence of measles, mumps, or rubella immunity or laboratory confirmation of disease should consider measles and mumps immunization with 2 doses of MMR vaccine or rubella immunization with 1 dose of MMR vaccine.  Pneumococcal 13-valent conjugate (PCV13) vaccine. When indicated, a person who is uncertain of his immunization history and has no record of immunization should receive the PCV13 vaccine. All adults 65 years of age and older should receive this  vaccine. An adult aged 19 years or older who has certain medical conditions and has not been previously immunized should receive 1 dose of PCV13 vaccine. This PCV13 should be followed with a dose of pneumococcal polysaccharide (PPSV23) vaccine. Adults who are at high risk for pneumococcal disease should obtain the PPSV23 vaccine at least 8 weeks after the dose of PCV13 vaccine. Adults older than 59 years of age who have normal immune system function should obtain the PPSV23 vaccine dose at least 1 year after the dose of PCV13 vaccine.  Pneumococcal polysaccharide (PPSV23) vaccine. When PCV13 is also indicated, PCV13 should be obtained first. All adults aged 65 years and older should be immunized. An adult younger than age 65 years who has certain medical conditions should be immunized. Any person who resides in a nursing home or long-term care facility should be immunized. An adult smoker should be immunized. People with an immunocompromised condition and certain other conditions should receive both PCV13 and PPSV23 vaccines. People with human immunodeficiency virus (HIV) infection should be immunized as soon as possible after diagnosis. Immunization during chemotherapy or radiation therapy should be avoided. Routine use of PPSV23 vaccine is not recommended for American Indians, [place] Natives, or people younger than 65 years unless there are medical conditions that require PPSV23 vaccine. When indicated, people who have unknown immunization and have no record of immunization should receive PPSV23 vaccine. One-time revaccination 5 years after the first dose of PPSV23 is recommended for people aged 19-64 years who have chronic kidney failure, nephrotic syndrome, asplenia, or immunocompromised conditions. People who received 1-2 doses of PPSV23 before age 65 years should receive another dose of PPSV23 vaccine at age 65 years or later if at least 5 years have passed since the previous dose. Doses of PPSV23 are not  needed for people immunized with PPSV23 at or after age 65 years.  Meningococcal vaccine. Adults with asplenia or persistent complement component deficiencies should receive 2 doses of quadrivalent meningococcal conjugate (MenACWY-D) vaccine. The doses should be obtained   at least 2 months apart. Microbiologists working with certain meningococcal bacteria, [place] recruits, people at risk during an outbreak, and people who travel to or live in countries with a high rate of meningitis should be immunized. A first-year college student up through age 34 years who is living in a residence hall should receive a dose if she did not receive a dose on or after her 16th birthday. Adults who have certain high-risk conditions should receive one or more doses of vaccine.  Hepatitis A vaccine. Adults who wish to be protected from this disease, have certain high-risk conditions, work with hepatitis A-infected animals, work in hepatitis A research labs, or travel to or work in countries with a high rate of hepatitis A should be immunized. Adults who were previously unvaccinated and who anticipate close contact with an international adoptee during the first 60 days after arrival in the [place] from a country with a high rate of hepatitis A should be immunized.  Hepatitis B vaccine. Adults who wish to be protected from this disease, have certain high-risk conditions, may be exposed to blood or other infectious body fluids, are household contacts or sex partners of hepatitis B positive people, are clients or workers in certain care facilities, or travel to or work in countries with a high rate of hepatitis B should be immunized.  Haemophilus influenzae type b (Hib) vaccine. A previously unvaccinated person with asplenia or sickle cell disease or having a scheduled splenectomy should receive 1 dose of Hib vaccine. Regardless of previous immunization, a recipient of a hematopoietic stem cell transplant should receive a  3-dose series 6-12 months after her successful transplant. Hib vaccine is not recommended for adults with HIV infection. Preventive Services / Frequency Ages 35 to 4 years  Blood pressure check.** / Every 3-5 years.  Lipid and cholesterol check.** / Every 5 years beginning at age 60.  Clinical breast exam.** / Every 3 years for women in their 71s and 10s.  BRCA-related cancer risk assessment.** / For women who have family members with a BRCA-related cancer (breast, ovarian, tubal, or peritoneal cancers).  Pap test.** / Every 2 years from ages 76 through 26. Every 3 years starting at age 61 through age 76 or 93 with a history of 3 consecutive normal Pap tests.  HPV screening.** / Every 3 years from ages 37 through ages 60 to 51 with a history of 3 consecutive normal Pap tests.  Hepatitis C blood test.** / For any individual with known risks for hepatitis C.  Skin self-exam. / Monthly.  Influenza vaccine. / Every year.  Tetanus, diphtheria, and acellular pertussis (Tdap, Td) vaccine.** / Consult your health care provider. Pregnant women should receive 1 dose of Tdap vaccine during each pregnancy. 1 dose of Td every 10 years.  Varicella vaccine.** / Consult your health care provider. Pregnant females who do not have evidence of immunity should receive the first dose after pregnancy.  HPV vaccine. / 3 doses over 6 months, if 93 and younger. The vaccine is not recommended for use in pregnant females. However, pregnancy testing is not needed before receiving a dose.  Measles, mumps, rubella (MMR) vaccine.** / You need at least 1 dose of MMR if you were born in 1957 or later. You may also need a 2nd dose. For females of childbearing age, rubella immunity should be determined. If there is no evidence of immunity, females who are not pregnant should be vaccinated. If there is no evidence of immunity, females who are  pregnant should delay immunization until after pregnancy.  Pneumococcal  13-valent conjugate (PCV13) vaccine.** / Consult your health care provider.  Pneumococcal polysaccharide (PPSV23) vaccine.** / 1 to 2 doses if you smoke cigarettes or if you have certain conditions.  Meningococcal vaccine.** / 1 dose if you are age 68 to 8 years and a Market researcher living in a residence hall, or have one of several medical conditions, you need to get vaccinated against meningococcal disease. You may also need additional booster doses.  Hepatitis A vaccine.** / Consult your health care provider.  Hepatitis B vaccine.** / Consult your health care provider.  Haemophilus influenzae type b (Hib) vaccine.** / Consult your health care provider. Ages 7 to 53 years  Blood pressure check.** / Every year.  Lipid and cholesterol check.** / Every 5 years beginning at age 25 years.  Lung cancer screening. / Every year if you are aged 11-80 years and have a 30-pack-year history of smoking and currently smoke or have quit within the past 15 years. Yearly screening is stopped once you have quit smoking for at least 15 years or develop a health problem that would prevent you from having lung cancer treatment.  Clinical breast exam.** / Every year after age 48 years.  BRCA-related cancer risk assessment.** / For women who have family members with a BRCA-related cancer (breast, ovarian, tubal, or peritoneal cancers).  Mammogram.** / Every year beginning at age 41 years and continuing for as long as you are in good health. Consult with your health care provider.  Pap test.** / Every 3 years starting at age 65 years through age 37 or 70 years with a history of 3 consecutive normal Pap tests.  HPV screening.** / Every 3 years from ages 72 years through ages 60 to 40 years with a history of 3 consecutive normal Pap tests.  Fecal occult blood test (FOBT) of stool. / Every year beginning at age 21 years and continuing until age 5 years. You may not need to do this test if you get  a colonoscopy every 10 years.  Flexible sigmoidoscopy or colonoscopy.** / Every 5 years for a flexible sigmoidoscopy or every 10 years for a colonoscopy beginning at age 35 years and continuing until age 48 years.  Hepatitis C blood test.** / For all people born from [birth year] through 1965 and any individual with known risks for hepatitis C.  Skin self-exam. / Monthly.  Influenza vaccine. / Every year.  Tetanus, diphtheria, and acellular pertussis (Tdap/Td) vaccine.** / Consult your health care provider. Pregnant women should receive 1 dose of Tdap vaccine during each pregnancy. 1 dose of Td every 10 years.  Varicella vaccine.** / Consult your health care provider. Pregnant females who do not have evidence of immunity should receive the first dose after pregnancy.  Zoster vaccine.** / 1 dose for adults aged 30 years or older.  Measles, mumps, rubella (MMR) vaccine.** / You need at least 1 dose of MMR if you were born in 1957 or later. You may also need a second dose. For females of childbearing age, rubella immunity should be determined. If there is no evidence of immunity, females who are not pregnant should be vaccinated. If there is no evidence of immunity, females who are pregnant should delay immunization until after pregnancy.  Pneumococcal 13-valent conjugate (PCV13) vaccine.** / Consult your health care provider.  Pneumococcal polysaccharide (PPSV23) vaccine.** / 1 to 2 doses if you smoke cigarettes or if you have certain conditions.  Meningococcal vaccine.** /  Consult your health care provider.  Hepatitis A vaccine.** / Consult your health care provider.  Hepatitis B vaccine.** / Consult your health care provider.  Haemophilus influenzae type b (Hib) vaccine.** / Consult your health care provider. Ages 65 years and over  Blood pressure check.** / Every year.  Lipid and cholesterol check.** / Every 5 years beginning at age 20 years.  Lung cancer screening. / Every year if you  are aged 55-80 years and have a 30-pack-year history of smoking and currently smoke or have quit within the past 15 years. Yearly screening is stopped once you have quit smoking for at least 15 years or develop a health problem that would prevent you from having lung cancer treatment.  Clinical breast exam.** / Every year after age 40 years.  BRCA-related cancer risk assessment.** / For women who have family members with a BRCA-related cancer (breast, ovarian, tubal, or peritoneal cancers).  Mammogram.** / Every year beginning at age 40 years and continuing for as long as you are in good health. Consult with your health care provider.  Pap test.** / Every 3 years starting at age 30 years through age 65 or 70 years with 3 consecutive normal Pap tests. Testing can be stopped between 65 and 70 years with 3 consecutive normal Pap tests and no abnormal Pap or HPV tests in the past 10 years.  HPV screening.** / Every 3 years from ages 30 years through ages 65 or 70 years with a history of 3 consecutive normal Pap tests. Testing can be stopped between 65 and 70 years with 3 consecutive normal Pap tests and no abnormal Pap or HPV tests in the past 10 years.  Fecal occult blood test (FOBT) of stool. / Every year beginning at age 50 years and continuing until age 75 years. You may not need to do this test if you get a colonoscopy every 10 years.  Flexible sigmoidoscopy or colonoscopy.** / Every 5 years for a flexible sigmoidoscopy or every 10 years for a colonoscopy beginning at age 50 years and continuing until age 75 years.  Hepatitis C blood test.** / For all people born from 1945 through 1965 and any individual with known risks for hepatitis C.  Osteoporosis screening.** / A one-time screening for women ages 65 years and over and women at risk for fractures or osteoporosis.  Skin self-exam. / Monthly.  Influenza vaccine. / Every year.  Tetanus, diphtheria, and acellular pertussis (Tdap/Td)  vaccine.** / 1 dose of Td every 10 years.  Varicella vaccine.** / Consult your health care provider.  Zoster vaccine.** / 1 dose for adults aged 60 years or older.  Pneumococcal 13-valent conjugate (PCV13) vaccine.** / Consult your health care provider.  Pneumococcal polysaccharide (PPSV23) vaccine.** / 1 dose for all adults aged 65 years and older.  Meningococcal vaccine.** / Consult your health care provider.  Hepatitis A vaccine.** / Consult your health care provider.  Hepatitis B vaccine.** / Consult your health care provider.  Haemophilus influenzae type b (Hib) vaccine.** / Consult your health care provider. ** Family history and personal history of risk and conditions may change your health care provider's recommendations.   This information is not intended to replace advice given to you by your health care provider. Make sure you discuss any questions you have with your health care provider.   Document Released: 09/04/2001 Document Revised: 07/30/2014 Document Reviewed: 12/04/2010 Elsevier Interactive Patient Education 2016 [employer] Inc.  

## 2015-11-10 NOTE — Progress Notes (Signed)
Pre visit review using our clinic review tool, if applicable. No additional management support is needed unless otherwise documented below in the visit note. 

## 2015-11-11 LAB — CYTOLOGY - PAP

## 2015-11-15 LAB — CERVICOVAGINAL ANCILLARY ONLY
Bacterial vaginitis: NEGATIVE
Candida vaginitis: NEGATIVE

## 2015-11-17 ENCOUNTER — Telehealth: Payer: Self-pay | Admitting: Neurology

## 2015-11-17 NOTE — Telephone Encounter (Signed)
Pt needs to talk to someone about dosage change in her medication please call [phone]

## 2015-11-18 ENCOUNTER — Other Ambulatory Visit: Payer: Self-pay | Admitting: Neurology

## 2015-11-18 ENCOUNTER — Encounter: Payer: Self-pay | Admitting: Neurology

## 2015-11-18 MED ORDER — TOPIRAMATE 25 MG PO TABS: 75.0000 mg | ORAL_TABLET | Freq: Every day | ORAL | Status: DC

## 2015-11-18 NOTE — Telephone Encounter (Signed)
Please see mychart message.

## 2015-11-18 NOTE — Telephone Encounter (Signed)
See my chart message

## 2015-11-18 NOTE — Telephone Encounter (Signed)
Left message on VM.

## 2015-12-02 DIAGNOSIS — S22060S Wedge compression fracture of T7-T8 vertebra, sequela: Secondary | ICD-10-CM | POA: Diagnosis not present

## 2015-12-02 DIAGNOSIS — M546 Pain in thoracic spine: Secondary | ICD-10-CM | POA: Diagnosis not present

## 2015-12-02 DIAGNOSIS — M542 Cervicalgia: Secondary | ICD-10-CM | POA: Diagnosis not present

## 2015-12-02 DIAGNOSIS — M5136 Other intervertebral disc degeneration, lumbar region: Secondary | ICD-10-CM | POA: Diagnosis not present

## 2015-12-07 ENCOUNTER — Encounter: Payer: Self-pay | Admitting: Neurology

## 2015-12-07 ENCOUNTER — Ambulatory Visit (INDEPENDENT_AMBULATORY_CARE_PROVIDER_SITE_OTHER): Payer: BLUE CROSS/BLUE SHIELD | Admitting: Neurology

## 2015-12-07 VITALS — BP 100/72 | HR 73 | Ht 70.0 in | Wt 123.0 lb

## 2015-12-07 DIAGNOSIS — G43009 Migraine without aura, not intractable, without status migrainosus: Secondary | ICD-10-CM

## 2015-12-07 MED ORDER — TOPIRAMATE 50 MG PO TABS: 50.0000 mg | ORAL_TABLET | Freq: Two times a day (BID) | ORAL | Status: DC

## 2015-12-07 NOTE — Patient Instructions (Signed)
1.  Increase topamax to 50mg  twice daily.  Contact us in 4 weeks with update and we can increase if needed. 2.  Stop Maxalt.  Continue sumatripan 100mg  3.  Follow up in 4 months.

## 2015-12-07 NOTE — Progress Notes (Signed)
NEUROLOGY FOLLOW UP OFFICE NOTE  Tracy Cook [id number]  HISTORY OF PRESENT ILLNESS: Tracy Cook is a 60 year old right-handed female with POTS who follows up for chronic migraine.  UPDATE: Intensity:  7/10 Duration:  1 hour usually with triptan Frequency:  10 to 15 days per month Current NSAIDS:  none Current analgesics:  none Current triptans:  sumatriptan 100mg , Maxalt Current anti-nausea:  none Current muscle relaxants:  Tizanidine 4mg  Antihypertensive medications:  propranolol 20mg  three times daily Antidepressant medications:  none Anticonvulsant medications:  topiramate 75mg  Vitamins/Herbal/Supplements:  Magnesium 400mg  Antihistamines/Decongestants:  none  Caffeine:  daily Alcohol:  no Smoker:  no Diet:  Does not keep hydrated Exercise:  routine Depression/stress:  anxiety Sleep hygiene:  Varies  HISTORY: Onset:  "almost all of my life" Location:  Bi-frontal/temporal, right retro-orbital Quality:  pounding Intensity:  7/10 Aura:  no Prodrome:  no Associated symptoms:  Nausea, photophobia, phonophobia, osmophobia, rarely vomiting Duration:  Several hours (1 hour with sumatriptan) Frequency:  3 to 4 days per week Triggers/exacerbating factors:  Light, anxiety, narcotics Relieving factors:  sumatriptan Activity:  Usually can function  Past NSAIDS:  Ibuprofen, naproxen Past analgesics:  Tylenol, Excedrin Past abortive triptans:  Maxalt (effective) Past muscle relaxants:  Skelaxin Past anti-nausea:  none Past antihypertensive medications:  none Past antidepressant medications:  none Past anticonvulsant medications:  none Past vitamins/Herbal/Supplements:  none Past antihistamines/decongestants:  Benadryl  She has history of cardiomyopathy, but cardiology noted that LV function mostly normal on last MUGA.  PAST MEDICAL HISTORY: Past Medical History  Diagnosis Date  . Tachycardia, unspecified   . Other malaise and fatigue   . Migraine   .  POTS (postural orthostatic tachycardia syndrome)   . Persistent disorder of initiating or maintaining sleep   . Orthostatic hypotension   . Insomnia   . Menopause   . Cardiomyopathy ([place])   . Osteoporosis     MEDICATIONS: Current Outpatient Prescriptions on File Prior to Visit  Medication Sig Dispense Refill  . amphetamine-dextroamphetamine (ADDERALL) 10 MG tablet Take 10 mg by mouth 2 (two) times daily.     Marland Kitchen escitalopram (LEXAPRO) 5 MG tablet Take 5 mg by mouth daily.  1  . propranolol (INDERAL) 20 MG tablet Take 1 tablet (20 mg total) by mouth 3 (three) times daily. 90 tablet 11  . SUMAtriptan (IMITREX) 100 MG tablet Take one at onset of headache.  May repeat once in 2 hours if headache persists or recurs.  Do not exceed 2 tablets in 24 hours 12 tablet 9  . tiZANidine (ZANAFLEX) 4 MG tablet Take 4 mg by mouth as needed.     Marland Kitchen alendronate (FOSAMAX) 70 MG tablet Take 1 tablet (70 mg total) by mouth every 7 (seven) days. Take with a full glass of water on an empty stomach. (Patient not taking: Reported on 12/07/2015) 12 tablet 3   No current facility-administered medications on file prior to visit.    ALLERGIES: Allergies  Allergen Reactions  . Vicodin [Hydrocodone-Acetaminophen] Nausea And Vomiting  . Codeine Nausea And Vomiting    FAMILY HISTORY: Family History  Problem Relation Age of Onset  . Coronary artery disease Father     Died of MI at age 32  . Asthma Father   . Hypertension Father   . Sudden death Father   . Heart attack Father   . Stroke Mother   . Diabetes Neg Hx   . Hyperlipidemia Neg Hx     SOCIAL HISTORY: Social History  Social History  . Marital Status: Married    Spouse Name: N/A  . Number of Children: 2  . Years of Education: N/A   Occupational History  .      Medial lab technologist   Social History Main Topics  . Smoking status: Never Smoker   . Smokeless tobacco: Never Used  . Alcohol Use: No  . Drug Use: Not on file  . Sexual  Activity:    Partners: Male   Other Topics Concern  . Not on file   Social History Narrative    REVIEW OF SYSTEMS: Constitutional: No fevers, chills, or sweats, no generalized fatigue, change in appetite Eyes: No visual changes, double vision, eye pain Ear, nose and throat: No hearing loss, ear pain, nasal congestion, sore throat Cardiovascular: No chest pain, palpitations Respiratory:  No shortness of breath at rest or with exertion, wheezes GastrointestinaI: No nausea, vomiting, diarrhea, abdominal pain, fecal incontinence Genitourinary:  No dysuria, urinary retention or frequency Musculoskeletal:  No neck pain, back pain Integumentary: No rash, pruritus, skin lesions Neurological: as above Psychiatric: No depression, insomnia, anxiety Endocrine: No palpitations, fatigue, diaphoresis, mood swings, change in appetite, change in weight, increased thirst Hematologic/Lymphatic:  No purpura, petechiae. Allergic/Immunologic: no itchy/runny eyes, nasal congestion, recent allergic reactions, rashes  PHYSICAL EXAM: Filed Vitals:   12/07/15 0959  BP: 100/72  Pulse: 73   General: No acute distress.  Patient appears well-groomed.  normal body habitus. Head:  Normocephalic/atraumatic Eyes:  Fundi examined but not visualized Neck: supple, no paraspinal tenderness, full range of motion Heart:  Regular rate and rhythm Lungs:  Clear to auscultation bilaterally Back: No paraspinal tenderness Neurological Exam: alert and oriented to person, place, and time. Attention span and concentration intact, recent and remote memory intact, fund of knowledge intact.  Speech fluent and not dysarthric, language intact.  CN II-XII intact. Bulk and tone normal, muscle strength 5/5 throughout.  Sensation to light touch, temperature and vibration intact.  Deep tendon reflexes 2+ throughout, toes downgoing.  Finger to nose and heel to shin testing intact.  Gait normal, Romberg negative.  IMPRESSION: Migraine  without aura  PLAN: 1.  Increase topiramate to 50mg  twice daily 2.  Sumatriptan 100mg , try taking with naproxen 500mg .  Advised to stop Maxalt as she is on propranolol. 3.  She will contact us in 4 weeks with update and we can increase topiramate if needed.  She will monitor memory. 4.  Follow up in 4 months.  15 minutes spent face to face with patient, over 50% spent discussing management.  Metta Clines, DO  CC:  Garnet Koyanagi, DO

## 2015-12-12 DIAGNOSIS — M67441 Ganglion, right hand: Secondary | ICD-10-CM | POA: Diagnosis not present

## 2015-12-12 DIAGNOSIS — L814 Other melanin hyperpigmentation: Secondary | ICD-10-CM | POA: Diagnosis not present

## 2015-12-26 ENCOUNTER — Ambulatory Visit: Payer: BC Managed Care – PPO | Admitting: Neurology

## 2016-01-02 DIAGNOSIS — F900 Attention-deficit hyperactivity disorder, predominantly inattentive type: Secondary | ICD-10-CM | POA: Diagnosis not present

## 2016-02-03 ENCOUNTER — Encounter: Payer: Self-pay | Admitting: Neurology

## 2016-02-03 MED ORDER — TOPIRAMATE 100 MG PO TABS: ORAL_TABLET | ORAL | Status: DC

## 2016-02-03 MED ORDER — ELETRIPTAN HYDROBROMIDE 40 MG PO TABS: 40.0000 mg | ORAL_TABLET | ORAL | Status: DC | PRN

## 2016-02-03 NOTE — Telephone Encounter (Signed)
topiramate to 50mg  in AM and 100mg  at night for one week, then to 100mg  twice daily."

## 2016-02-03 NOTE — Addendum Note (Signed)
Addended by: Gerda Diss A on: 02/03/2016 04:22 PM   Modules accepted: Orders

## 2016-02-03 NOTE — Telephone Encounter (Signed)
Please see pt's mychart message about needing more abortive medication. From last visit's A&P, "2. Sumatriptan 100mg , try taking with naproxen 500mg . Advised to stop Maxalt as she is on propranolol"

## 2016-02-13 DIAGNOSIS — M67441 Ganglion, right hand: Secondary | ICD-10-CM | POA: Diagnosis not present

## 2016-03-23 MED FILL — PROPRANOLOL 20 MG TABLET: 20 | 30 days supply | Qty: 90 | Fill #5

## 2016-04-02 DIAGNOSIS — F900 Attention-deficit hyperactivity disorder, predominantly inattentive type: Secondary | ICD-10-CM | POA: Diagnosis not present

## 2016-04-10 ENCOUNTER — Ambulatory Visit: Payer: BC Managed Care – PPO | Admitting: Neurology

## 2016-04-19 ENCOUNTER — Other Ambulatory Visit: Payer: Self-pay | Admitting: Neurology

## 2016-04-20 NOTE — Telephone Encounter (Signed)
topiramate to 50mg  in AM and 100mg  at night for one week, then to 100mg  twice daily."

## 2016-04-23 ENCOUNTER — Encounter: Payer: Self-pay | Admitting: Family Medicine

## 2016-04-23 ENCOUNTER — Ambulatory Visit (INDEPENDENT_AMBULATORY_CARE_PROVIDER_SITE_OTHER): Payer: BLUE CROSS/BLUE SHIELD | Admitting: Family Medicine

## 2016-04-23 VITALS — BP 104/74 | HR 79 | Temp 97.4°F | Resp 16 | Wt 127.8 lb

## 2016-04-23 DIAGNOSIS — F988 Other specified behavioral and emotional disorders with onset usually occurring in childhood and adolescence: Secondary | ICD-10-CM | POA: Diagnosis not present

## 2016-04-23 MED ORDER — AMPHETAMINE-DEXTROAMPHETAMINE 10 MG PO TABS: 10.0000 mg | ORAL_TABLET | Freq: Two times a day (BID) | ORAL | 0 refills | Status: DC

## 2016-04-23 NOTE — Progress Notes (Signed)
Patient ID: Tracy Cook, female    DOB: 11/05/1955  Age: 60 y.o. MRN: [id number]    Subjective:  Subjective  HPI Tracy Cook presents for ADD f/u-- she had been seeing psych but they keep leaving so she would like [place] to take over.  She has been on the same dose forever and does not need it everyday.    Review of Systems  Constitutional: Negative for appetite change, diaphoresis, fatigue and unexpected weight change.  Eyes: Negative for pain, redness and visual disturbance.  Respiratory: Negative for cough, chest tightness, shortness of breath and wheezing.   Cardiovascular: Negative for chest pain, palpitations and leg swelling.  Endocrine: Negative for cold intolerance, heat intolerance, polydipsia, polyphagia and polyuria.  Genitourinary: Negative for difficulty urinating, dysuria and frequency.  Neurological: Negative for dizziness, light-headedness, numbness and headaches.    History Past Medical History:  Diagnosis Date  . Cardiomyopathy ([place])   . Insomnia   . Menopause   . Migraine   . Orthostatic hypotension   . Osteoporosis   . Other malaise and fatigue   . Persistent disorder of initiating or maintaining sleep   . POTS (postural orthostatic tachycardia syndrome)   . Tachycardia, unspecified     She has no past surgical history on file.   Her family history includes Asthma in her father; Coronary artery disease in her father; Heart attack in her father; Hypertension in her father; Stroke in her mother; Sudden death in her father.She reports that she has never smoked. She has never used smokeless tobacco. She reports that she does not drink alcohol. Her drug history is not on file.  Current Outpatient Prescriptions on File Prior to Visit  Medication Sig Dispense Refill  . alendronate (FOSAMAX) 70 MG tablet Take 1 tablet (70 mg total) by mouth every 7 (seven) days. Take with a full glass of water on an empty stomach. 12 tablet 3  . eletriptan (RELPAX) 40 MG  tablet Take 1 tablet (40 mg total) by mouth as needed for migraine or headache. May repeat in 2 hours if headache persists or recurs. 6 tablet 0  . propranolol (INDERAL) 20 MG tablet Take 1 tablet (20 mg total) by mouth 3 (three) times daily. 90 tablet 11  . SUMAtriptan (IMITREX) 100 MG tablet Take one at onset of headache.  May repeat once in 2 hours if headache persists or recurs.  Do not exceed 2 tablets in 24 hours 12 tablet 9  . tiZANidine (ZANAFLEX) 4 MG tablet Take 4 mg by mouth as needed.     . topiramate (TOPAMAX) 100 MG tablet Take 1 tablet (100 mg total) by mouth 2 (two) times daily. 60 tablet 2  . escitalopram (LEXAPRO) 5 MG tablet Take 5 mg by mouth daily.  1  . topiramate (TOPAMAX) 50 MG tablet Take 1 tablet (50 mg total) by mouth 2 (two) times daily. (Patient not taking: Reported on 04/23/2016) 60 tablet 3   No current facility-administered medications on file prior to visit.      Objective:  Objective  Physical Exam  Constitutional: She is oriented to person, place, and time. She appears well-developed and well-nourished.  HENT:  Head: Normocephalic and atraumatic.  Eyes: Conjunctivae and EOM are normal.  Neck: Normal range of motion. Neck supple. No JVD present. Carotid bruit is not present. No thyromegaly present.  Cardiovascular: Normal rate, regular rhythm and normal heart sounds.   No murmur heard. Pulmonary/Chest: Effort normal and breath sounds normal. No respiratory distress.  She has no wheezes. She has no rales. She exhibits no tenderness.  Musculoskeletal: She exhibits no edema.  Neurological: She is alert and oriented to person, place, and time.  Psychiatric: She has a normal mood and affect.  Nursing note and vitals reviewed.  BP 104/74 (BP Location: Right Arm, Patient Position: Sitting, Cuff Size: Normal)   Pulse 79   Temp 97.4 F (36.3 C) (Oral)   Resp 16   Wt 127 lb 12.8 oz (58 kg)   SpO2 100%   BMI 18.34 kg/m  Wt Readings from Last 3 Encounters:    04/23/16 127 lb 12.8 oz (58 kg)  12/07/15 123 lb (55.8 kg)  11/10/15 125 lb (56.7 kg)     Lab Results  Component Value Date   WBC 5.2 11/10/2015   HGB 14.2 11/10/2015   HCT 41.5 11/10/2015   PLT 261.0 11/10/2015   GLUCOSE 87 11/10/2015   CHOL 136 11/10/2015   TRIG 75.0 11/10/2015   HDL 59.10 11/10/2015   LDLCALC 62 11/10/2015   ALT 17 11/10/2015   AST 19 11/10/2015   NA 138 11/10/2015   K 4.2 11/10/2015   CL 106 11/10/2015   CREATININE 0.82 11/10/2015   BUN 13 11/10/2015   CO2 29 11/10/2015   TSH 0.69 11/10/2015    No results found.   Assessment & Plan:  Plan  I have changed Ms. Piccirilli's amphetamine-dextroamphetamine. I am also having her maintain her tiZANidine, propranolol, SUMAtriptan, escitalopram, alendronate, topiramate, eletriptan, and topiramate.  Meds ordered this encounter  Medications  . amphetamine-dextroamphetamine (ADDERALL) 10 MG tablet    Sig: Take 1 tablet (10 mg total) by mouth 2 (two) times daily.    Dispense:  60 tablet    Refill:  0    Problem List Items Addressed This Visit    None    Visit Diagnoses    Attention deficit disorder (ADD) without hyperactivity    -  Primary   Relevant Medications   amphetamine-dextroamphetamine (ADDERALL) 10 MG tablet    f/u 6 months or sooner prn Contract signed  Follow-up: Return in about 6 months (around 10/22/2016).  [name], DO

## 2016-04-23 NOTE — Patient Instructions (Signed)

## 2016-04-23 NOTE — Progress Notes (Signed)
Pre visit review using our clinic review tool, if applicable. No additional management support is needed unless otherwise documented below in the visit note. 

## 2016-05-02 ENCOUNTER — Ambulatory Visit (INDEPENDENT_AMBULATORY_CARE_PROVIDER_SITE_OTHER): Payer: BLUE CROSS/BLUE SHIELD | Admitting: Neurology

## 2016-05-02 ENCOUNTER — Encounter: Payer: Self-pay | Admitting: Neurology

## 2016-05-02 VITALS — BP 110/66 | HR 64 | Ht 69.0 in | Wt 125.0 lb

## 2016-05-02 DIAGNOSIS — G43009 Migraine without aura, not intractable, without status migrainosus: Secondary | ICD-10-CM

## 2016-05-02 NOTE — Patient Instructions (Signed)
1.  Continue topiramate 50mg  in morning and 100mg  at night 2.  Use sumatriptan as needed. 3.  Follow up in 6 months.

## 2016-05-02 NOTE — Progress Notes (Signed)
NEUROLOGY FOLLOW UP OFFICE NOTE  JAWANNA GLAZIER [id number]  HISTORY OF PRESENT ILLNESS: Texanna Mcclaugherty is a 60 year old right-handed female with POTS who follows up for chronic migraine.   UPDATE: Improved. Intensity:  6/10 Duration:  One hour Frequency:  4 days per month Current NSAIDS:  none Current analgesics:  none Current triptans:  sumatriptan 50mg  to 100mg  OR Relpax Current anti-nausea:  none Current muscle relaxants:  Tizanidine 4mg  Antihypertensive medications:  propranolol 20mg  three times daily Antidepressant medications:  none Anticonvulsant medications:  topiramate 50mg  in AM and 100mg  in PM (100mg  twice daily caused paresthesias) Vitamins/Herbal/Supplements:  Magnesium 400mg  Antihistamines/Decongestants:  None  She is more aware of triggers.   Caffeine:  daily Alcohol:  no Smoker:  no Diet:  Does not keep hydrated Exercise:  routine Depression/stress:  anxiety Sleep hygiene:  Varies   HISTORY: Onset:  "almost all of my life" Location:  Bi-frontal/temporal, right retro-orbital Quality:  pounding Initial Intensity:  7/10; May: 7/10 Aura:  no Prodrome:  no Associated symptoms:  Nausea, photophobia, phonophobia, osmophobia, rarely vomiting Initial Duration:  Several hours (1 hour with sumatriptan); May: 1 hour usually with triptan Initial Frequency:  3 to 4 days per week; May: 10 to 15 days per month Triggers/exacerbating factors:  Light, anxiety, narcotics, fan blowing in face, wine Relieving factors:  sumatriptan Activity:  Usually can function   Past NSAIDS:  Ibuprofen, naproxen Past analgesics:  Tylenol, Excedrin Past abortive triptans:  Maxalt Past muscle relaxants:  Skelaxin Past anti-nausea:  none Past antihypertensive medications:  none Past antidepressant medications:  none Past anticonvulsant medications:  none Past vitamins/Herbal/Supplements:  none Past antihistamines/decongestants:  Benadryl   She has history of cardiomyopathy,  but cardiology noted that LV function mostly normal on last MUGA.  PAST MEDICAL HISTORY: Past Medical History:  Diagnosis Date  . Cardiomyopathy ([place])   . Insomnia   . Menopause   . Migraine   . Orthostatic hypotension   . Osteoporosis   . Other malaise and fatigue   . Persistent disorder of initiating or maintaining sleep   . POTS (postural orthostatic tachycardia syndrome)   . Tachycardia, unspecified     MEDICATIONS: Current Outpatient Prescriptions on File Prior to Visit  Medication Sig Dispense Refill  . alendronate (FOSAMAX) 70 MG tablet Take 1 tablet (70 mg total) by mouth every 7 (seven) days. Take with a full glass of water on an empty stomach. 12 tablet 3  . amphetamine-dextroamphetamine (ADDERALL) 10 MG tablet Take 1 tablet (10 mg total) by mouth 2 (two) times daily. 60 tablet 0  . propranolol (INDERAL) 20 MG tablet Take 1 tablet (20 mg total) by mouth 3 (three) times daily. 90 tablet 11  . SUMAtriptan (IMITREX) 100 MG tablet Take one at onset of headache.  May repeat once in 2 hours if headache persists or recurs.  Do not exceed 2 tablets in 24 hours 12 tablet 9  . tiZANidine (ZANAFLEX) 4 MG tablet Take 4 mg by mouth as needed.     . topiramate (TOPAMAX) 100 MG tablet Take 1 tablet (100 mg total) by mouth 2 (two) times daily. 60 tablet 2   No current facility-administered medications on file prior to visit.     ALLERGIES: Allergies  Allergen Reactions  . Vicodin [Hydrocodone-Acetaminophen] Nausea And Vomiting  . Codeine Nausea And Vomiting    FAMILY HISTORY: Family History  Problem Relation Age of Onset  . Coronary artery disease Father     Died of MI at  age 35  . Asthma Father   . Hypertension Father   . Sudden death Father   . Heart attack Father   . Stroke Mother   . Diabetes Neg Hx   . Hyperlipidemia Neg Hx    .  SOCIAL HISTORY: Social History   Social History  . Marital status: Married    Spouse name: N/A  . Number of children: 2  . Years of  education: N/A   Occupational History  .      Medial lab technologist   Social History Main Topics  . Smoking status: Never Smoker  . Smokeless tobacco: Never Used  . Alcohol use No  . Drug use: Unknown  . Sexual activity: Yes    Partners: Male   Other Topics Concern  . Not on file   Social History Narrative  . No narrative on file    REVIEW OF SYSTEMS: Constitutional: No fevers, chills, or sweats, no generalized fatigue, change in appetite Eyes: No visual changes, double vision, eye pain Ear, nose and throat: No hearing loss, ear pain, nasal congestion, sore throat Cardiovascular: No chest pain, palpitations Respiratory:  No shortness of breath at rest or with exertion, wheezes GastrointestinaI: No nausea, vomiting, diarrhea, abdominal pain, fecal incontinence Genitourinary:  No dysuria, urinary retention or frequency Musculoskeletal:  No neck pain, back pain Integumentary: No rash, pruritus, skin lesions Neurological: as above Psychiatric: No depression, insomnia, anxiety Endocrine: No palpitations, fatigue, diaphoresis, mood swings, change in appetite, change in weight, increased thirst Hematologic/Lymphatic:  No purpura, petechiae. Allergic/Immunologic: no itchy/runny eyes, nasal congestion, recent allergic reactions, rashes  PHYSICAL EXAM: Vitals:   05/02/16 0738  BP: 110/66  Pulse: 64   General: No acute distress.  Patient appears well-groomed.  normal body habitus. Head:  Normocephalic/atraumatic Eyes:  Fundi examined but not visualized Neck: supple, no paraspinal tenderness, full range of motion Heart:  Regular rate and rhythm Lungs:  Clear to auscultation bilaterally Back: No paraspinal tenderness Neurological Exam: alert and oriented to person, place, and time. Attention span and concentration intact, recent and remote memory intact, fund of knowledge intact.  Speech fluent and not dysarthric, language intact.  CN II-XII intact. Bulk and tone normal, muscle  strength 5/5 throughout.  Sensation to light touch  intact.  Deep tendon reflexes 2+ throughout.  Finger to nose testing intact.  Gait normal  IMPRESSION: Migraine without aura  PLAN: 1.  Continue Topamax 50mg  in AM and 100mg  in PM 2.  Triptans as needed 3.  Follow up in 6 months.  15 minutes spent face to face with patient, over 50% spent counseling.  Metta Clines, DO  CC:  Garnet Koyanagi, DO

## 2016-06-15 ENCOUNTER — Other Ambulatory Visit: Payer: Self-pay | Admitting: Family Medicine

## 2016-06-15 DIAGNOSIS — I429 Cardiomyopathy, unspecified: Secondary | ICD-10-CM

## 2016-06-19 ENCOUNTER — Telehealth: Payer: Self-pay | Admitting: Family Medicine

## 2016-06-19 MED FILL — PROPRANOLOL 20 MG TABLET: 20 | 30 days supply | Qty: 90 | Fill #0

## 2016-06-19 NOTE — Telephone Encounter (Signed)
Relation to [id number] Call [phone] [phone] Pharmacy: [place], [place] - [address] [phone] (Phone) [phone] (Fax)    Reason for call:  Patient is currently at pharmacy inquiring about propranolol (INDERAL) 20 MG tablet, as per pharmacy Rx never received

## 2016-06-19 NOTE — Telephone Encounter (Signed)
Medication filled to pharmacy as requested.   

## 2016-06-19 NOTE — Telephone Encounter (Signed)
Medication was filled to pharmacy today as requested. Called pharmacy and verified that they received rx.

## 2016-07-05 ENCOUNTER — Encounter: Payer: Self-pay | Admitting: Family Medicine

## 2016-07-05 DIAGNOSIS — F988 Other specified behavioral and emotional disorders with onset usually occurring in childhood and adolescence: Secondary | ICD-10-CM

## 2016-07-05 NOTE — Telephone Encounter (Signed)
If it on time and shes been here in last 6 months she can have it

## 2016-07-09 MED ORDER — AMPHETAMINE-DEXTROAMPHETAMINE 10 MG PO TABS: 10.0000 mg | ORAL_TABLET | Freq: Two times a day (BID) | ORAL | 0 refills | Status: DC

## 2016-07-09 NOTE — Telephone Encounter (Signed)
See RX, needs UDS when she picks it up please.

## 2016-07-10 ENCOUNTER — Encounter: Payer: Self-pay | Admitting: Family Medicine

## 2016-07-10 DIAGNOSIS — Z79899 Other long term (current) drug therapy: Secondary | ICD-10-CM | POA: Diagnosis not present

## 2016-07-10 NOTE — Telephone Encounter (Signed)
Rx placed at front desk for pick up with note to provide UDS. Message sent to pt in 07/10/16 patient email.

## 2016-07-27 MED FILL — PROPRANOLOL 20 MG TABLET: 20 | 30 days supply | Qty: 90 | Fill #1

## 2016-08-02 ENCOUNTER — Other Ambulatory Visit: Payer: Self-pay | Admitting: Neurology

## 2016-08-07 ENCOUNTER — Encounter: Payer: Self-pay | Admitting: Family Medicine

## 2016-08-07 ENCOUNTER — Other Ambulatory Visit: Payer: Self-pay | Admitting: Family Medicine

## 2016-08-13 ENCOUNTER — Encounter: Payer: Self-pay | Admitting: Family Medicine

## 2016-08-13 ENCOUNTER — Other Ambulatory Visit: Payer: Self-pay | Admitting: Family Medicine

## 2016-08-13 DIAGNOSIS — M62838 Other muscle spasm: Secondary | ICD-10-CM | POA: Diagnosis not present

## 2016-08-13 DIAGNOSIS — M5136 Other intervertebral disc degeneration, lumbar region: Secondary | ICD-10-CM | POA: Diagnosis not present

## 2016-08-13 DIAGNOSIS — F988 Other specified behavioral and emotional disorders with onset usually occurring in childhood and adolescence: Secondary | ICD-10-CM

## 2016-08-13 MED ORDER — AMPHETAMINE-DEXTROAMPHETAMINE 10 MG PO TABS: 10.0000 mg | ORAL_TABLET | Freq: Two times a day (BID) | ORAL | 0 refills | Status: DC

## 2016-08-13 NOTE — Telephone Encounter (Signed)
I ok'd this last week!

## 2016-08-15 ENCOUNTER — Encounter: Payer: Self-pay | Admitting: Cardiology

## 2016-08-15 ENCOUNTER — Ambulatory Visit (INDEPENDENT_AMBULATORY_CARE_PROVIDER_SITE_OTHER): Payer: BLUE CROSS/BLUE SHIELD | Admitting: Cardiology

## 2016-08-15 VITALS — BP 126/86 | HR 56 | Ht 69.0 in | Wt 127.4 lb

## 2016-08-15 DIAGNOSIS — I498 Other specified cardiac arrhythmias: Secondary | ICD-10-CM

## 2016-08-15 DIAGNOSIS — I951 Orthostatic hypotension: Secondary | ICD-10-CM

## 2016-08-15 DIAGNOSIS — R072 Precordial pain: Secondary | ICD-10-CM | POA: Diagnosis not present

## 2016-08-15 DIAGNOSIS — G90A Postural orthostatic tachycardia syndrome (POTS): Secondary | ICD-10-CM

## 2016-08-15 DIAGNOSIS — I429 Cardiomyopathy, unspecified: Secondary | ICD-10-CM

## 2016-08-15 DIAGNOSIS — R000 Tachycardia, unspecified: Secondary | ICD-10-CM | POA: Diagnosis not present

## 2016-08-15 NOTE — Patient Instructions (Signed)
Medication Instructions:   NO CHANGE  Testing/Procedures:  Your physician has requested that you have an echocardiogram. Echocardiography is a painless test that uses sound waves to create images of your heart. It provides your doctor with information about the size and shape of your heart and how well your heart's chambers and valves are working. This procedure takes approximately one hour. There are no restrictions for this procedure.   Your physician has requested that you have an exercise tolerance test. For further information please visit [email or web address]. Please also follow instruction sheet, as given.    Follow-Up:  Your physician wants you to follow-up in: [place] will receive a reminder letter in the mail two months in advance. If you don't receive a letter, please call our office to schedule the follow-up appointment.   If you need a refill on your cardiac medications before your next appointment, please call your pharmacy.     Exercise Stress Electrocardiogram An exercise stress electrocardiogram is a test to check how blood flows to your heart. It is done to find areas of poor blood flow. You will need to walk on a treadmill for this test. The electrocardiogram will record your heartbeat when you are at rest and when you are exercising. What happens before the procedure?  Do not have drinks with caffeine or foods with caffeine for 24 hours before the test, or as told by your doctor. This includes coffee, tea (even decaf tea), sodas, chocolate, and cocoa.  Follow your doctor's instructions about eating and drinking before the test.  Ask your doctor what medicines you should or should not take before the test. Take your medicines with water unless told by your doctor not to.  If you use an inhaler, bring it with you to the test.  Bring a snack to eat after the test.  Do not  smoke for 4 hours before the test.  Do not put lotions, powders, creams,  or oils on your chest before the test.  Wear comfortable shoes and clothing. What happens during the procedure?  You will have patches put on your chest. Small areas of your chest may need to be shaved. Wires will be connected to the patches.  Your heart rate will be watched while you are resting and while you are exercising.  You will walk on the treadmill. The treadmill will slowly get faster to raise your heart rate.  The test will take about 1-2 hours. What happens after the procedure?  Your heart rate and blood pressure will be watched after the test.  You may return to your normal diet, activities, and medicines or as told by your doctor. This information is not intended to replace advice given to you by your health care provider. Make sure you discuss any questions you have with your health care provider. Document Released: 12/26/2007 Document Revised: 03/07/2016 Document Reviewed: 03/16/2013 Elsevier Interactive Patient Education  2017 [employer].

## 2016-08-15 NOTE — Progress Notes (Signed)
HPI: FU cardiomyopathy. H/O POTS. Last seen 9/15. Stress echocardiogram performed in [place] in February of 2013 showed an ejection fraction of 45% at rest that improves to 70% with exercise. MUGA 4/13 showed EF 60. Since last seen, she has occasional chest pain. It is in the left upper chest. Class 1-2 seconds. One episode occurred while exercising and radiated to her jaw. However she typically does not have exertional chest pain. There is no dyspnea on exertion, palpitations or syncope.  Current Outpatient Prescriptions  Medication Sig Dispense Refill  . alendronate (FOSAMAX) 70 MG tablet Take 1 tablet (70 mg total) by mouth every 7 (seven) days. Take with a full glass of water on an empty stomach. 12 tablet 3  . amphetamine-dextroamphetamine (ADDERALL) 10 MG tablet Take 1 tablet (10 mg total) by mouth 2 (two) times daily. January 2018 60 tablet 0  . NUCYNTA 50 MG tablet TK 1 T PO Q 6 H PRN  0  . predniSONE (STERAPRED UNI-PAK 21 TAB) 10 MG (21) TBPK tablet FPD  0  . propranolol (INDERAL) 20 MG tablet TAKE 1 TABLET (20 MG TOTAL) BY MOUTH 3 (THREE) TIMES DAILY. 90 tablet 5  . SUMAtriptan (IMITREX) 100 MG tablet Take one at onset of headache.  May repeat once in 2 hours if headache persists or recurs.  Do not exceed 2 tablets in 24 hours 12 tablet 9  . tiZANidine (ZANAFLEX) 4 MG tablet Take 4 mg by mouth as needed.     . topiramate (TOPAMAX) 100 MG tablet TAKE 1 TABLET(100 MG) BY MOUTH TWICE DAILY 60 tablet 0   No current facility-administered medications for this visit.      Past Medical History:  Diagnosis Date  . Cardiomyopathy ([place])   . Insomnia   . Menopause   . Migraine   . Orthostatic hypotension   . Osteoporosis   . Other malaise and fatigue   . Persistent disorder of initiating or maintaining sleep   . POTS (postural orthostatic tachycardia syndrome)   . Tachycardia, unspecified     History reviewed. No pertinent surgical history.  Social History   Social  History  . Marital status: Married    Spouse name: N/A  . Number of children: 2  . Years of education: N/A   Occupational History  .      Medial lab technologist   Social History Main Topics  . Smoking status: Never Smoker  . Smokeless tobacco: Never Used  . Alcohol use No  . Drug use: Unknown  . Sexual activity: Yes    Partners: Male   Other Topics Concern  . Not on file   Social History Narrative  . No narrative on file    Family History  Problem Relation Age of Onset  . Coronary artery disease Father     Died of MI at age 72  . Asthma Father   . Hypertension Father   . Sudden death Father   . Heart attack Father   . Stroke Mother   . Diabetes Neg Hx   . Hyperlipidemia Neg Hx     ROS: fatigue but no fevers or chills, productive cough, hemoptysis, dysphasia, odynophagia, melena, hematochezia, dysuria, hematuria, rash, seizure activity, orthopnea, PND, pedal edema, claudication. Remaining systems are negative.  Physical Exam: Well-developed thin in no acute distress.  Skin is warm and dry.  HEENT is normal.  Neck is supple.  Chest is clear to auscultation with normal expansion.  Cardiovascular exam is  regular rate and rhythm.  Abdominal exam nontender or distended. No masses palpated. Extremities show no edema. neuro grossly intact  ECG-Sinus rhythm at a rate 56. No ST changes.  A/P  1 cardiomyopathy-LV function improved on most recent MUGA. Plan repeat echocardiogram.  2 pots-continue propranolol. Symptoms well controlled.  3 chest pain-symptoms are atypical. I will arrange an exercise treadmill for risk stratification.  Kirk Ruths, MD

## 2016-08-24 ENCOUNTER — Ambulatory Visit (HOSPITAL_COMMUNITY): Payer: BLUE CROSS/BLUE SHIELD | Attending: Cardiovascular Disease

## 2016-08-24 ENCOUNTER — Ambulatory Visit (INDEPENDENT_AMBULATORY_CARE_PROVIDER_SITE_OTHER): Payer: BLUE CROSS/BLUE SHIELD

## 2016-08-24 ENCOUNTER — Other Ambulatory Visit: Payer: Self-pay

## 2016-08-24 DIAGNOSIS — I429 Cardiomyopathy, unspecified: Secondary | ICD-10-CM

## 2016-08-24 DIAGNOSIS — I5189 Other ill-defined heart diseases: Secondary | ICD-10-CM | POA: Diagnosis not present

## 2016-08-24 LAB — EXERCISE TOLERANCE TEST
Estimated workload: 11.7 METS
Exercise duration (min): 10 min
Exercise duration (sec): 0 s
MPHR: 161 {beats}/min
Peak HR: 141 {beats}/min
Percent HR: 87 %
RPE: 17
Rest HR: 65 {beats}/min

## 2016-09-02 ENCOUNTER — Other Ambulatory Visit: Payer: Self-pay | Admitting: Neurology

## 2016-09-03 MED FILL — PROPRANOLOL 20 MG TABLET: 20 | 30 days supply | Qty: 90 | Fill #2

## 2016-09-06 DIAGNOSIS — M546 Pain in thoracic spine: Secondary | ICD-10-CM | POA: Diagnosis not present

## 2016-09-11 ENCOUNTER — Encounter: Payer: Self-pay | Admitting: Family Medicine

## 2016-09-11 ENCOUNTER — Other Ambulatory Visit: Payer: Self-pay | Admitting: Family Medicine

## 2016-09-11 DIAGNOSIS — F988 Other specified behavioral and emotional disorders with onset usually occurring in childhood and adolescence: Secondary | ICD-10-CM

## 2016-09-11 MED ORDER — AMPHETAMINE-DEXTROAMPHETAMINE 10 MG PO TABS: 10.0000 mg | ORAL_TABLET | Freq: Two times a day (BID) | ORAL | 0 refills | Status: DC

## 2016-09-11 NOTE — Telephone Encounter (Signed)
Ok to refill for 3 months.  

## 2016-09-13 DIAGNOSIS — M546 Pain in thoracic spine: Secondary | ICD-10-CM | POA: Diagnosis not present

## 2016-09-18 DIAGNOSIS — M5136 Other intervertebral disc degeneration, lumbar region: Secondary | ICD-10-CM | POA: Diagnosis not present

## 2016-09-18 DIAGNOSIS — M546 Pain in thoracic spine: Secondary | ICD-10-CM | POA: Diagnosis not present

## 2016-09-18 DIAGNOSIS — S22060S Wedge compression fracture of T7-T8 vertebra, sequela: Secondary | ICD-10-CM | POA: Diagnosis not present

## 2016-09-25 ENCOUNTER — Other Ambulatory Visit: Payer: Self-pay | Admitting: Neurology

## 2016-09-25 ENCOUNTER — Encounter: Payer: Self-pay | Admitting: Neurology

## 2016-09-25 MED ORDER — ELETRIPTAN HYDROBROMIDE 40 MG PO TABS: 40.0000 mg | ORAL_TABLET | ORAL | 0 refills | Status: DC | PRN

## 2016-10-10 ENCOUNTER — Encounter: Payer: Self-pay | Admitting: Family Medicine

## 2016-10-11 MED FILL — PROPRANOLOL 20 MG TABLET: 20 | 30 days supply | Qty: 90 | Fill #3

## 2016-10-16 DIAGNOSIS — S22040D Wedge compression fracture of fourth thoracic vertebra, subsequent encounter for fracture with routine healing: Secondary | ICD-10-CM | POA: Diagnosis not present

## 2016-10-16 DIAGNOSIS — S22060S Wedge compression fracture of T7-T8 vertebra, sequela: Secondary | ICD-10-CM | POA: Diagnosis not present

## 2016-10-23 ENCOUNTER — Ambulatory Visit: Payer: BC Managed Care – PPO | Admitting: Family Medicine

## 2016-10-26 ENCOUNTER — Encounter: Payer: Self-pay | Admitting: Family Medicine

## 2016-10-26 ENCOUNTER — Ambulatory Visit (INDEPENDENT_AMBULATORY_CARE_PROVIDER_SITE_OTHER): Payer: BLUE CROSS/BLUE SHIELD | Admitting: Family Medicine

## 2016-10-26 DIAGNOSIS — F988 Other specified behavioral and emotional disorders with onset usually occurring in childhood and adolescence: Secondary | ICD-10-CM | POA: Diagnosis not present

## 2016-10-26 MED ORDER — AMPHETAMINE-DEXTROAMPHETAMINE 10 MG PO TABS: 10.0000 mg | ORAL_TABLET | Freq: Two times a day (BID) | ORAL | 0 refills | Status: DC

## 2016-10-26 NOTE — Progress Notes (Signed)
Patient ID: Tracy Cook, female   DOB: [birth year], 61 y.o.   MRN: [id number]   Subjective:  I acted as a Education administrator for [employer], DO. Raiford Noble, [place]   Patient ID: Tracy Cook, female    DOB: 07-11-1956, 61 y.o.   MRN: [id number]  Chief Complaint  Patient presents with  . ADD    HPI  Patient is in today for a follow up on medication. Patient states that she is doing well on current dosage. Patient has a Hx of migraines, postural hypotension, fatigue. No acute concerns noted at this time.  Patient Care Team: [name], DO as PCP - General (Family Medicine) Dineen Kid, DO as Consulting Physician (Psychiatry) Suella Broad, MD as Consulting Physician (Physical Medicine and Rehabilitation) Pieter Partridge, DO as Consulting Physician (Neurology) Iona Beard, MD as Referring Physician (Optometry)   Past Medical History:  Diagnosis Date  . Cardiomyopathy ([place])   . Insomnia   . Menopause   . Migraine   . Orthostatic hypotension   . Osteoporosis   . Other malaise and fatigue   . Persistent disorder of initiating or maintaining sleep   . POTS (postural orthostatic tachycardia syndrome)   . Tachycardia, unspecified     No past surgical history on file.  Family History  Problem Relation Age of Onset  . Coronary artery disease Father     Died of MI at age 45  . Asthma Father   . Hypertension Father   . Sudden death Father   . Heart attack Father   . Stroke Mother   . Diabetes Neg Hx   . Hyperlipidemia Neg Hx     Social History   Social History  . Marital status: Married    Spouse name: N/A  . Number of children: 2  . Years of education: N/A   Occupational History  .      Medial lab technologist   Social History Main Topics  . Smoking status: Never Smoker  . Smokeless tobacco: Never Used  . Alcohol use No  . Drug use: Unknown  . Sexual activity: Yes    Partners: Male   Other Topics Concern  . Not on file   Social History Narrative    . No narrative on file    Outpatient Medications Prior to Visit  Medication Sig Dispense Refill  . eletriptan (RELPAX) 40 MG tablet Take 1 tablet (40 mg total) by mouth as needed for migraine or headache. May repeat once in 2 hours if headache persists or recurs. 6 tablet 0  . NUCYNTA 50 MG tablet TK 1 T PO Q 6 H PRN  0  . predniSONE (STERAPRED UNI-PAK 21 TAB) 10 MG (21) TBPK tablet FPD  0  . propranolol (INDERAL) 20 MG tablet TAKE 1 TABLET (20 MG TOTAL) BY MOUTH 3 (THREE) TIMES DAILY. 90 tablet 5  . SUMAtriptan (IMITREX) 100 MG tablet SEE NOTES 12 tablet 0  . tiZANidine (ZANAFLEX) 4 MG tablet Take 4 mg by mouth as needed.     . topiramate (TOPAMAX) 100 MG tablet TAKE 1 TABLET(100 MG) BY MOUTH TWICE DAILY 60 tablet 2  . amphetamine-dextroamphetamine (ADDERALL) 10 MG tablet Take 1 tablet (10 mg total) by mouth 2 (two) times daily. April 2018 60 tablet 0  . alendronate (FOSAMAX) 70 MG tablet Take 1 tablet (70 mg total) by mouth every 7 (seven) days. Take with a full glass of water on an empty stomach. 12 tablet 3  No facility-administered medications prior to visit.     Allergies  Allergen Reactions  . Forteo [Teriparatide (Recombinant)] Other (See Comments)    Bone pain and headaches  . Fosamax [Alendronate Sodium]   . Prolia [Denosumab] Other (See Comments)    Flu like symptoms  . Vicodin [Hydrocodone-Acetaminophen] Nausea And Vomiting  . Codeine Nausea And Vomiting    Review of Systems  Constitutional: Negative for chills, fever and malaise/fatigue.  HENT: Negative for congestion and hearing loss.   Eyes: Negative for discharge.  Respiratory: Negative for cough, sputum production and shortness of breath.   Cardiovascular: Negative for chest pain, palpitations and leg swelling.  Gastrointestinal: Negative for abdominal pain, blood in stool, constipation, diarrhea, heartburn, nausea and vomiting.  Genitourinary: Negative for dysuria, frequency, hematuria and urgency.   Musculoskeletal: Negative for back pain, falls and myalgias.  Skin: Negative for rash.  Neurological: Negative for dizziness, sensory change, loss of consciousness, weakness and headaches.  Endo/Heme/Allergies: Negative for environmental allergies. Does not bruise/bleed easily.  Psychiatric/Behavioral: Negative for depression and suicidal ideas. The patient is not nervous/anxious and does not have insomnia.        Objective:    Physical Exam  Constitutional: She is oriented to person, place, and time. She appears well-developed and well-nourished.  HENT:  Head: Normocephalic and atraumatic.  Eyes: Conjunctivae and EOM are normal.  Neck: Normal range of motion. Neck supple. No JVD present. Carotid bruit is not present. No thyromegaly present.  Cardiovascular: Normal rate, regular rhythm and normal heart sounds.   No murmur heard. Pulmonary/Chest: Effort normal and breath sounds normal. No respiratory distress. She has no wheezes. She has no rales. She exhibits no tenderness.  Musculoskeletal: She exhibits no edema.  Neurological: She is alert and oriented to person, place, and time.  Psychiatric: She has a normal mood and affect.  Nursing note and vitals reviewed.   BP 98/68 (BP Location: Left Arm, Cuff Size: Normal)   Pulse 82   Temp 97.6 F (36.4 C) (Oral)   Resp 16   Ht 5\' 9"  (1.753 m)   Wt 124 lb 6.4 oz (56.4 kg)   SpO2 99%   BMI 18.37 kg/m  Wt Readings from Last 3 Encounters:  10/26/16 124 lb 6.4 oz (56.4 kg)  08/15/16 127 lb 6.4 oz (57.8 kg)  05/02/16 125 lb (56.7 kg)   BP Readings from Last 3 Encounters:  10/26/16 98/68  08/15/16 126/86  05/02/16 110/66     Immunization History  Administered Date(s) Administered  . Influenza,inj,Quad PF,36+ Mos 04/23/2013  . Influenza-Unspecified 05/27/2014, 05/03/2015    Health Maintenance  Topic Date Due  . COLONOSCOPY  04/10/2015  . MAMMOGRAM  09/10/2016  . INFLUENZA VACCINE  04/23/2017 (Originally 02/20/2017)  . PAP  SMEAR  11/10/2018  . TETANUS/TDAP  05/08/2022  . Hepatitis C Screening  Completed  . HIV Screening  Addressed    Lab Results  Component Value Date   WBC 5.2 11/10/2015   HGB 14.2 11/10/2015   HCT 41.5 11/10/2015   PLT 261.0 11/10/2015   GLUCOSE 87 11/10/2015   CHOL 136 11/10/2015   TRIG 75.0 11/10/2015   HDL 59.10 11/10/2015   LDLCALC 62 11/10/2015   ALT 17 11/10/2015   AST 19 11/10/2015   NA 138 11/10/2015   K 4.2 11/10/2015   CL 106 11/10/2015   CREATININE 0.82 11/10/2015   BUN 13 11/10/2015   CO2 29 11/10/2015   TSH 0.69 11/10/2015    Lab Results  Component Value  Date   TSH 0.69 11/10/2015   Lab Results  Component Value Date   WBC 5.2 11/10/2015   HGB 14.2 11/10/2015   HCT 41.5 11/10/2015   MCV 89.2 11/10/2015   PLT 261.0 11/10/2015   Lab Results  Component Value Date   NA 138 11/10/2015   K 4.2 11/10/2015   CO2 29 11/10/2015   GLUCOSE 87 11/10/2015   BUN 13 11/10/2015   CREATININE 0.82 11/10/2015   BILITOT 0.4 11/10/2015   ALKPHOS 50 11/10/2015   AST 19 11/10/2015   ALT 17 11/10/2015   PROT 6.9 11/10/2015   ALBUMIN 4.3 11/10/2015   CALCIUM 9.2 11/10/2015   GFR 75.92 11/10/2015   Lab Results  Component Value Date   CHOL 136 11/10/2015   Lab Results  Component Value Date   HDL 59.10 11/10/2015   Lab Results  Component Value Date   LDLCALC 62 11/10/2015   Lab Results  Component Value Date   TRIG 75.0 11/10/2015   Lab Results  Component Value Date   CHOLHDL 2 11/10/2015   No results found for: HGBA1C       Assessment & Plan:   Problem List Items Addressed This Visit    None    Visit Diagnoses    Attention deficit disorder (ADD) without hyperactivity       Relevant Medications   amphetamine-dextroamphetamine (ADDERALL) 10 MG tablet    stable-- con't adderall  rto 6 months I have discontinued Ms. Balthazar's alendronate. I am also having her start on amphetamine-dextroamphetamine and amphetamine-dextroamphetamine.  Additionally, I am having her maintain her tiZANidine, propranolol, predniSONE, NUCYNTA, SUMAtriptan, topiramate, eletriptan, calcitonin (salmon), and amphetamine-dextroamphetamine.  Meds ordered this encounter  Medications  . calcitonin, salmon, (MIACALCIN/FORTICAL) 200 UNIT/ACT nasal spray    Sig: U 1 SPR IN ROTATING NOSTRILS UTD    Refill:  0  . amphetamine-dextroamphetamine (ADDERALL) 10 MG tablet    Sig: Take 1 tablet (10 mg total) by mouth 2 (two) times daily. April 2018    Dispense:  60 tablet    Refill:  0    Do not fill until May 2018.  Marland Kitchen amphetamine-dextroamphetamine (ADDERALL) 10 MG tablet    Sig: Take 1 tablet (10 mg total) by mouth 2 (two) times daily.    Dispense:  60 tablet    Refill:  0    Do not fill until June 2018.  Marland Kitchen amphetamine-dextroamphetamine (ADDERALL) 10 MG tablet    Sig: Take 1 tablet (10 mg total) by mouth 2 (two) times daily.    Dispense:  60 tablet    Refill:  0    Do not fill until July 2018.    CMA served as Education administrator during this visit. History, Physical and Plan performed by medical provider. Documentation and orders reviewed and attested to.  [name], DO

## 2016-10-26 NOTE — Progress Notes (Signed)
Pre visit review using our clinic review tool, if applicable. No additional management support is needed unless otherwise documented below in the visit note. 

## 2016-10-26 NOTE — Patient Instructions (Addendum)
Add ---  adderall refilled  rto 6 months Please keep your CPE appointment on 11/13/2016 Osteoporosis Osteoporosis happens when your bones become thinner and weaker. Weak bones can break (fracture) more easily when you slip or fall. Bones most at risk of breaking are in the hip, wrist, and spine. Follow these instructions at home:  Get enough calcium and vitamin D. These nutrients are good for your bones.  Exercise as told by your doctor.  Do not use any tobacco products. This includes cigarettes, chewing tobacco, and electronic cigarettes. If you need help quitting, ask your doctor.  Limit the amount of alcohol you drink.  Take medicines only as told by your doctor.  Keep all follow-up visits as told by your doctor. This is important.  Take care at home to prevent falls. Some ways to do this are:  Keep rooms well lit and tidy.  Put safety rails on your stairs.  Put a rubber mat in the bathroom and other places that are often wet or slippery. Get help right away if:  You fall.  You hurt yourself. This information is not intended to replace advice given to you by your health care provider. Make sure you discuss any questions you have with your health care provider. Document Released: 10/01/2011 Document Revised: 12/15/2015 Document Reviewed: 12/17/2013 Elsevier Interactive Patient Education  2017 [employer].

## 2016-10-28 ENCOUNTER — Other Ambulatory Visit: Payer: Self-pay | Admitting: Neurology

## 2016-10-31 ENCOUNTER — Ambulatory Visit: Payer: BC Managed Care – PPO | Admitting: Neurology

## 2016-10-31 DIAGNOSIS — Z029 Encounter for administrative examinations, unspecified: Secondary | ICD-10-CM

## 2016-11-01 ENCOUNTER — Encounter: Payer: Self-pay | Admitting: Neurology

## 2016-11-13 ENCOUNTER — Other Ambulatory Visit: Payer: Self-pay | Admitting: Family Medicine

## 2016-11-13 ENCOUNTER — Encounter: Payer: Self-pay | Admitting: Family Medicine

## 2016-11-13 ENCOUNTER — Ambulatory Visit (INDEPENDENT_AMBULATORY_CARE_PROVIDER_SITE_OTHER): Payer: BLUE CROSS/BLUE SHIELD | Admitting: Family Medicine

## 2016-11-13 VITALS — BP 100/70 | HR 81 | Temp 98.2°F | Resp 16 | Ht 69.0 in | Wt 124.2 lb

## 2016-11-13 DIAGNOSIS — Z0000 Encounter for general adult medical examination without abnormal findings: Secondary | ICD-10-CM | POA: Diagnosis not present

## 2016-11-13 DIAGNOSIS — R739 Hyperglycemia, unspecified: Secondary | ICD-10-CM

## 2016-11-13 DIAGNOSIS — J329 Chronic sinusitis, unspecified: Secondary | ICD-10-CM | POA: Diagnosis not present

## 2016-11-13 LAB — COMPREHENSIVE METABOLIC PANEL
ALT: 20 U/L (ref 0–35)
AST: 22 U/L (ref 0–37)
Albumin: 4.3 g/dL (ref 3.5–5.2)
Alkaline Phosphatase: 46 U/L (ref 39–117)
BUN: 13 mg/dL (ref 6–23)
CO2: 26 mEq/L (ref 19–32)
Calcium: 9.7 mg/dL (ref 8.4–10.5)
Chloride: 107 mEq/L (ref 96–112)
Creatinine, Ser: 0.84 mg/dL (ref 0.40–1.20)
GFR: 73.58 mL/min (ref >60.00)
Glucose, Bld: 110 mg/dL — ABNORMAL HIGH (ref 70–99)
Potassium: 3.9 mEq/L (ref 3.5–5.1)
Sodium: 139 mEq/L (ref 135–145)
Total Bilirubin: 0.6 mg/dL (ref 0.2–1.2)
Total Protein: 7 g/dL (ref 6.0–8.3)

## 2016-11-13 LAB — CBC
HCT: 41 % (ref 36.0–46.0)
Hemoglobin: 13.7 g/dL (ref 12.0–15.0)
MCHC: 33.5 g/dL (ref 30.0–36.0)
MCV: 95.1 fl (ref 78.0–100.0)
Platelets: 278 10*3/uL (ref 150.0–400.0)
RBC: 4.31 Mil/uL (ref 3.87–5.11)
RDW: 12.9 % (ref 11.5–15.5)
WBC: 10.5 10*3/uL (ref 4.0–10.5)

## 2016-11-13 LAB — TSH: TSH: 0.41 u[IU]/mL (ref 0.35–4.50)

## 2016-11-13 LAB — MICROALBUMIN / CREATININE URINE RATIO
Creatinine,U: 62.7 mg/dL
Microalb Creat Ratio: 1.1 mg/g (ref 0.0–30.0)
Microalb, Ur: <0.7 mg/dL (ref 0.0–1.9)

## 2016-11-13 LAB — POC URINALSYSI DIPSTICK (AUTOMATED)
Bilirubin, UA: NEGATIVE
Blood, UA: NEGATIVE
Glucose, UA: NEGATIVE
Ketones, UA: NEGATIVE
Leukocytes, UA: NEGATIVE
Nitrite, UA: NEGATIVE
Protein, UA: NEGATIVE
Spec Grav, UA: 1.015 (ref 1.010–1.025)
Urobilinogen, UA: NEGATIVE E.U./dL — AB
pH, UA: 6 (ref 5.0–8.0)

## 2016-11-13 LAB — LIPID PANEL
Cholesterol: 117 mg/dL (ref 0–200)
HDL: 57.5 mg/dL (ref >39.00)
LDL Cholesterol: 47 mg/dL (ref 0–99)
NonHDL: 59.77
Total CHOL/HDL Ratio: 2
Triglycerides: 66 mg/dL (ref 0.0–149.0)
VLDL: 13.2 mg/dL (ref 0.0–40.0)

## 2016-11-13 MED ORDER — AMOXICILLIN-POT CLAVULANATE 875-125 MG PO TABS: 1.0000 | ORAL_TABLET | Freq: Two times a day (BID) | ORAL | 0 refills | Status: DC

## 2016-11-13 MED ORDER — FLUCONAZOLE 150 MG PO TABS: 150.0000 mg | ORAL_TABLET | Freq: Once | ORAL | 0 refills | Status: AC

## 2016-11-13 MED ORDER — PROMETHAZINE-DM 6.25-15 MG/5ML PO SYRP: 5.0000 mL | ORAL_SOLUTION | Freq: Every evening | ORAL | 0 refills | Status: DC | PRN

## 2016-11-13 MED ORDER — FLUTICASONE PROPIONATE 50 MCG/ACT NA SUSP: 2.0000 | Freq: Every day | NASAL | 6 refills | Status: DC

## 2016-11-13 NOTE — Progress Notes (Signed)
Pre visit review using our clinic review tool, if applicable. No additional management support is needed unless otherwise documented below in the visit note. 

## 2016-11-13 NOTE — Addendum Note (Signed)
Addended by: Marjory Lies on: 11/13/2016 02:00 PM   Modules accepted: Orders

## 2016-11-13 NOTE — Assessment & Plan Note (Signed)
Pt to rto for cpe Unable to do today due to being sick Will get labs today

## 2016-11-13 NOTE — Progress Notes (Signed)
Subjective:  I acted as a Education administrator for Dr. Royden Purl, LPN    Patient ID: Tracy Cook, female    DOB: 01-Mar-1956, 61 y.o.   MRN: [id number]  Chief Complaint  Patient presents with  . Cough  . Nasal Congestion  . Chills    HPI  Patient is in today for c/o congestion, chills, cough, runny nose, and aching body for about 3 days. Patient report she took Delsym at 4pm and she is taking zyrtec with little relief.  No fevers.  Patient Care Team: [name], DO as PCP - General (Family Medicine) Dineen Kid, DO as Consulting Physician (Psychiatry) Suella Broad, MD as Consulting Physician (Physical Medicine and Rehabilitation) Pieter Partridge, DO as Consulting Physician (Neurology) Iona Beard, MD as Referring Physician (Optometry)   Past Medical History:  Diagnosis Date  . Cardiomyopathy ([place])   . Insomnia   . Menopause   . Migraine   . Orthostatic hypotension   . Osteoporosis   . Other malaise and fatigue   . Persistent disorder of initiating or maintaining sleep   . POTS (postural orthostatic tachycardia syndrome)   . Tachycardia, unspecified     No past surgical history on file.  Family History  Problem Relation Age of Onset  . Coronary artery disease Father     Died of MI at age 28  . Asthma Father   . Hypertension Father   . Sudden death Father   . Heart attack Father   . Stroke Mother   . Diabetes Neg Hx   . Hyperlipidemia Neg Hx     Social History   Social History  . Marital status: Married    Spouse name: N/A  . Number of children: 2  . Years of education: N/A   Occupational History  .      Medial lab technologist   Social History Main Topics  . Smoking status: Never Smoker  . Smokeless tobacco: Never Used  . Alcohol use No  . Drug use: Unknown  . Sexual activity: Yes    Partners: Male   Other Topics Concern  . Not on file   Social History Narrative  . No narrative on file    Outpatient Medications Prior to Visit    Medication Sig Dispense Refill  . amphetamine-dextroamphetamine (ADDERALL) 10 MG tablet Take 1 tablet (10 mg total) by mouth 2 (two) times daily. April 2018 60 tablet 0  . amphetamine-dextroamphetamine (ADDERALL) 10 MG tablet Take 1 tablet (10 mg total) by mouth 2 (two) times daily. 60 tablet 0  . amphetamine-dextroamphetamine (ADDERALL) 10 MG tablet Take 1 tablet (10 mg total) by mouth 2 (two) times daily. 60 tablet 0  . calcitonin, salmon, (MIACALCIN/FORTICAL) 200 UNIT/ACT nasal spray U 1 SPR IN ROTATING NOSTRILS UTD  0  . eletriptan (RELPAX) 40 MG tablet Take 1 tablet (40 mg total) by mouth as needed for migraine or headache. May repeat once in 2 hours if headache persists or recurs. 6 tablet 0  . NUCYNTA 50 MG tablet TK 1 T PO Q 6 H PRN  0  . predniSONE (STERAPRED UNI-PAK 21 TAB) 10 MG (21) TBPK tablet FPD  0  . propranolol (INDERAL) 20 MG tablet TAKE 1 TABLET (20 MG TOTAL) BY MOUTH 3 (THREE) TIMES DAILY. 90 tablet 5  . SUMAtriptan (IMITREX) 100 MG tablet SEE NOTES 12 tablet 0  . tiZANidine (ZANAFLEX) 4 MG tablet Take 4 mg by mouth as needed.     Marland Kitchen  topiramate (TOPAMAX) 100 MG tablet TAKE 1 TABLET(100 MG) BY MOUTH TWICE DAILY 60 tablet 2   No facility-administered medications prior to visit.     Allergies  Allergen Reactions  . Forteo [Teriparatide (Recombinant)] Other (See Comments)    Bone pain and headaches  . Fosamax [Alendronate Sodium]   . Prolia [Denosumab] Other (See Comments)    Flu like symptoms  . Vicodin [Hydrocodone-Acetaminophen] Nausea And Vomiting  . Codeine Nausea And Vomiting    Review of Systems  Constitutional: Positive for chills and malaise/fatigue. Negative for fever.  HENT: Positive for congestion and sinus pain. Negative for ear pain and sore throat.   Eyes: Negative for blurred vision.  Respiratory: Negative for cough.   Cardiovascular: Negative for chest pain and palpitations.  Gastrointestinal: Negative for vomiting.  Musculoskeletal: Negative for  back pain.  Skin: Negative for rash.  Neurological: Negative for loss of consciousness and headaches.  Psychiatric/Behavioral: Negative for depression, hallucinations, memory loss, substance abuse and suicidal ideas. The patient is not nervous/anxious and does not have insomnia.        Objective:    Physical Exam  Constitutional: She is oriented to person, place, and time. She appears well-developed and well-nourished.  HENT:  Right Ear: External ear normal. Tympanic membrane is not perforated and not erythematous. A middle ear effusion is present.  Left Ear: External ear normal. Tympanic membrane is not injected, not scarred, not perforated and not erythematous.  No middle ear effusion.  Nose: Right sinus exhibits maxillary sinus tenderness and frontal sinus tenderness. Left sinus exhibits maxillary sinus tenderness and frontal sinus tenderness.  Mouth/Throat: Posterior oropharyngeal erythema present.  + PND + errythema  Eyes: Conjunctivae are normal. Right eye exhibits no discharge. Left eye exhibits no discharge.  Cardiovascular: Normal rate, regular rhythm and normal heart sounds.   No murmur heard. Pulmonary/Chest: Effort normal and breath sounds normal. No respiratory distress. She has no wheezes. She has no rales. She exhibits no tenderness.  Musculoskeletal: She exhibits no edema.  Lymphadenopathy:    She has cervical adenopathy.  Neurological: She is alert and oriented to person, place, and time.  Psychiatric: She has a normal mood and affect. Her behavior is normal. Judgment and thought content normal.  Nursing note and vitals reviewed.   BP 100/70 (BP Location: Right Arm, Patient Position: Sitting, Cuff Size: Normal)   Pulse 81   Temp 98.2 F (36.8 C) (Oral)   Resp 16   Ht 5\' 9"  (1.753 m)   Wt 124 lb 3.2 oz (56.3 kg)   SpO2 98%   BMI 18.34 kg/m  Wt Readings from Last 3 Encounters:  11/13/16 124 lb 3.2 oz (56.3 kg)  10/26/16 124 lb 6.4 oz (56.4 kg)  08/15/16 127  lb 6.4 oz (57.8 kg)   BP Readings from Last 3 Encounters:  11/13/16 100/70  10/26/16 98/68  08/15/16 126/86     Immunization History  Administered Date(s) Administered  . Influenza,inj,Quad PF,36+ Mos 04/23/2013  . Influenza-Unspecified 05/27/2014, 05/03/2015    Health Maintenance  Topic Date Due  . COLONOSCOPY  04/10/2015  . MAMMOGRAM  09/10/2016  . INFLUENZA VACCINE  04/23/2017 (Originally 02/20/2017)  . PAP SMEAR  11/10/2018  . TETANUS/TDAP  05/08/2022  . Hepatitis C Screening  Completed  . HIV Screening  Addressed    Lab Results  Component Value Date   WBC 5.2 11/10/2015   HGB 14.2 11/10/2015   HCT 41.5 11/10/2015   PLT 261.0 11/10/2015   GLUCOSE 87 11/10/2015  CHOL 136 11/10/2015   TRIG 75.0 11/10/2015   HDL 59.10 11/10/2015   LDLCALC 62 11/10/2015   ALT 17 11/10/2015   AST 19 11/10/2015   NA 138 11/10/2015   K 4.2 11/10/2015   CL 106 11/10/2015   CREATININE 0.82 11/10/2015   BUN 13 11/10/2015   CO2 29 11/10/2015   TSH 0.69 11/10/2015    Lab Results  Component Value Date   TSH 0.69 11/10/2015   Lab Results  Component Value Date   WBC 5.2 11/10/2015   HGB 14.2 11/10/2015   HCT 41.5 11/10/2015   MCV 89.2 11/10/2015   PLT 261.0 11/10/2015   Lab Results  Component Value Date   NA 138 11/10/2015   K 4.2 11/10/2015   CO2 29 11/10/2015   GLUCOSE 87 11/10/2015   BUN 13 11/10/2015   CREATININE 0.82 11/10/2015   BILITOT 0.4 11/10/2015   ALKPHOS 50 11/10/2015   AST 19 11/10/2015   ALT 17 11/10/2015   PROT 6.9 11/10/2015   ALBUMIN 4.3 11/10/2015   CALCIUM 9.2 11/10/2015   GFR 75.92 11/10/2015   Lab Results  Component Value Date   CHOL 136 11/10/2015   Lab Results  Component Value Date   HDL 59.10 11/10/2015   Lab Results  Component Value Date   LDLCALC 62 11/10/2015   Lab Results  Component Value Date   TRIG 75.0 11/10/2015   Lab Results  Component Value Date   CHOLHDL 2 11/10/2015   No results found for: HGBA1C        Assessment & Plan:   Problem List Items Addressed This Visit      Unprioritized   Preventative health care    Pt to rto for cpe Unable to do today due to being sick Will get labs today      Relevant Orders   Lipid panel   CBC   Comprehensive metabolic panel   TSH   POCT Urinalysis Dipstick (Automated) (Completed)   Microalbumin / creatinine urine ratio    Other Visit Diagnoses    Sinusitis, unspecified chronicity, unspecified location    -  Primary   Relevant Medications   promethazine-dextromethorphan (PROMETHAZINE-DM) 6.25-15 MG/5ML syrup   fluticasone (FLONASE) 50 MCG/ACT nasal spray   amoxicillin-clavulanate (AUGMENTIN) 875-125 MG tablet      I am having Ms. Truex start on promethazine-dextromethorphan, fluticasone, and amoxicillin-clavulanate. I am also having her maintain her tiZANidine, propranolol, predniSONE, NUCYNTA, topiramate, eletriptan, calcitonin (salmon), amphetamine-dextroamphetamine, amphetamine-dextroamphetamine, amphetamine-dextroamphetamine, and SUMAtriptan.  Meds ordered this encounter  Medications  . promethazine-dextromethorphan (PROMETHAZINE-DM) 6.25-15 MG/5ML syrup    Sig: Take 5 mLs by mouth at bedtime as needed for cough.    Dispense:  118 mL    Refill:  0  . fluticasone (FLONASE) 50 MCG/ACT nasal spray    Sig: Place 2 sprays into both nostrils daily.    Dispense:  16 g    Refill:  6  . amoxicillin-clavulanate (AUGMENTIN) 875-125 MG tablet    Sig: Take 1 tablet by mouth 2 (two) times daily.    Dispense:  20 tablet    Refill:  0    CMA served as scribe during this visit. History, Physical and Plan performed by medical provider. Documentation and orders reviewed and attested to.  [name], DO  Patient ID: GARLENE APPERSON, female   DOB: March 09, 1956, 61 y.o.   MRN: [id number]

## 2016-11-13 NOTE — Patient Instructions (Addendum)
Sinusitis, Adult Sinusitis is soreness and inflammation of your sinuses. Sinuses are hollow spaces in the bones around your face. They are located:  Around your eyes.  In the middle of your forehead.  Behind your nose.  In your cheekbones. Your sinuses and nasal passages are lined with a stringy fluid (mucus). Mucus normally drains out of your sinuses. When your nasal tissues get inflamed or swollen, the mucus can get trapped or blocked so air cannot flow through your sinuses. This lets bacteria, viruses, and funguses grow, and that leads to infection. Follow these instructions at home: Medicines   Take, use, or apply over-the-counter and prescription medicines only as told by your doctor. These may include nasal sprays.  If you were prescribed an antibiotic medicine, take it as told by your doctor. Do not stop taking the antibiotic even if you start to feel better. Hydrate and Humidify   Drink enough water to keep your pee (urine) clear or pale yellow.  Use a cool mist humidifier to keep the humidity level in your home above 50%.  Breathe in steam for 10-15 minutes, 3-4 times a day or as told by your doctor. You can do this in the bathroom while a hot shower is running.  Try not to spend time in cool or dry air. Rest   Rest as much as possible.  Sleep with your head raised (elevated).  Make sure to get enough sleep each night. General instructions   Put a warm, moist washcloth on your face 3-4 times a day or as told by your doctor. This will help with discomfort.  Wash your hands often with soap and water. If there is no soap and water, use hand sanitizer.  Do not smoke. Avoid being around people who are smoking (secondhand smoke).  Keep all follow-up visits as told by your doctor. This is important. Contact a doctor if:  You have a fever.  Your symptoms get worse.  Your symptoms do not get better within 10 days. Get help right away if:  You have a very bad  headache.  You cannot stop throwing up (vomiting).  You have pain or swelling around your face or eyes.  You have trouble seeing.  You feel confused.  Your neck is stiff.  You have trouble breathing. This information is not intended to replace advice given to you by your health care provider. Make sure you discuss any questions you have with your health care provider. Document Released: 12/26/2007 Document Revised: 03/04/2016 Document Reviewed: 05/04/2015 Elsevier Interactive Patient Education  2017 [employer]  Acute Bronchitis, Adult Acute bronchitis is when air tubes (bronchi) in the lungs suddenly get swollen. The condition can make it hard to breathe. It can also cause these symptoms:  A cough.  Coughing up clear, yellow, or green mucus.  Wheezing.  Chest congestion.  Shortness of breath.  A fever.  Body aches.  Chills.  A sore throat. Follow these instructions at home: Medicines   Take over-the-counter and prescription medicines only as told by your doctor.  If you were prescribed an antibiotic medicine, take it as told by your doctor. Do not stop taking the antibiotic even if you start to feel better. General instructions   Rest.  Drink enough fluids to keep your pee (urine) clear or pale yellow.  Avoid smoking and secondhand smoke. If you smoke and you need help quitting, ask your doctor. Quitting will help your lungs heal faster.  Use an inhaler, cool mist vaporizer, or  humidifier as told by your doctor.  Keep all follow-up visits as told by your doctor. This is important. How is this prevented? To lower your risk of getting this condition again:  Wash your hands often with soap and water. If you cannot use soap and water, use hand sanitizer.  Avoid contact with people who have cold symptoms.  Try not to touch your hands to your mouth, nose, or eyes.  Make sure to get the flu shot every year. Contact a doctor if:  Your symptoms do not get  better in 2 weeks. Get help right away if:  You cough up blood.  You have chest pain.  You have very bad shortness of breath.  You become dehydrated.  You faint (pass out) or keep feeling like you are going to pass out.  You keep throwing up (vomiting).  You have a very bad headache.  Your fever or chills gets worse. This information is not intended to replace advice given to you by your health care provider. Make sure you discuss any questions you have with your health care provider. Document Released: 12/26/2007 Document Revised: 02/15/2016 Document Reviewed: 12/28/2015 Elsevier Interactive Patient Education  2017 [employer].

## 2016-11-15 ENCOUNTER — Other Ambulatory Visit: Payer: Self-pay | Admitting: Family Medicine

## 2016-11-15 ENCOUNTER — Encounter: Payer: Self-pay | Admitting: Family Medicine

## 2016-11-15 DIAGNOSIS — M810 Age-related osteoporosis without current pathological fracture: Secondary | ICD-10-CM

## 2016-11-15 MED FILL — PROPRANOLOL 20 MG TABLET: 20 | 30 days supply | Qty: 90 | Fill #4

## 2016-11-15 NOTE — Telephone Encounter (Signed)
Pt needs bmd Stop fosamax  May need to switch to prolia

## 2016-11-16 DIAGNOSIS — H10023 Other mucopurulent conjunctivitis, bilateral: Secondary | ICD-10-CM | POA: Diagnosis not present

## 2016-11-16 MED FILL — TOBRAMYCIN-DEXAMETH OPTH SU: 0.3-0.1 | 14 days supply | Qty: 5 | Fill #0

## 2016-11-20 ENCOUNTER — Other Ambulatory Visit: Payer: Self-pay | Admitting: Neurology

## 2016-11-28 ENCOUNTER — Ambulatory Visit (HOSPITAL_BASED_OUTPATIENT_CLINIC_OR_DEPARTMENT_OTHER)
Admission: RE | Admit: 2016-11-28 | Discharge: 2016-11-28 | Disposition: A | Discharge status: Home / Self Care | Payer: BLUE CROSS/BLUE SHIELD | Source: Ambulatory Visit | Attending: Family Medicine | Admitting: Family Medicine

## 2016-11-28 DIAGNOSIS — M810 Age-related osteoporosis without current pathological fracture: Secondary | ICD-10-CM | POA: Insufficient documentation

## 2016-11-28 DIAGNOSIS — Z780 Asymptomatic menopausal state: Secondary | ICD-10-CM | POA: Diagnosis not present

## 2016-11-30 ENCOUNTER — Other Ambulatory Visit: Payer: Self-pay | Admitting: Neurology

## 2016-12-06 MED FILL — TOBRAMYCIN-DEXAMETH OPTH SU: 0.3-0.1 | 14 days supply | Qty: 5 | Fill #1

## 2016-12-10 ENCOUNTER — Encounter: Payer: Self-pay | Admitting: Neurology

## 2016-12-10 ENCOUNTER — Ambulatory Visit (INDEPENDENT_AMBULATORY_CARE_PROVIDER_SITE_OTHER): Payer: BLUE CROSS/BLUE SHIELD | Admitting: Neurology

## 2016-12-10 VITALS — BP 88/60 | HR 88 | Ht 69.5 in | Wt 123.0 lb

## 2016-12-10 DIAGNOSIS — G43009 Migraine without aura, not intractable, without status migrainosus: Secondary | ICD-10-CM | POA: Diagnosis not present

## 2016-12-10 NOTE — Patient Instructions (Signed)
1.   Continue topiramate 100mg  twice daily 2.  Use sumatriptan if needed 3.  Continue your healthy lifestyle.  Increase water intake 4.  Follow up in 8 to 9 months.

## 2016-12-10 NOTE — Progress Notes (Signed)
NEUROLOGY FOLLOW UP OFFICE NOTE  Tracy Cook [id number]  HISTORY OF PRESENT ILLNESS: Tracy Cook is a 61 year old right-handed female with POTS who follows up for chronic migraine.   UPDATE: Still well-controlled.  She notes that the narcotics she uses to treat back pain flare-ups tend to be a trigger for migraines. Intensity:  6/10 Duration:  1 hour Frequency:  4 days per month (usually triggered by narcotic use for acute back pain) Current NSAIDS:  none Current analgesics:  none Current triptans:  sumatriptan 50mg  to 100mg  OR Relpax Current anti-nausea:  none Current muscle relaxants:  Tizanidine 4mg  Antihypertensive medications:  propranolol 20mg  three times daily Antidepressant medications:  none Anticonvulsant medications:  topiramate 100mg  twice daily Vitamins/Herbal/Supplements:  Magnesium 400mg  Antihistamines/Decongestants:  None    Caffeine:  daily Alcohol:  no Smoker:  no Diet:  Does not keep hydrated.  Diet with little or no diary and wheat. Exercise:  routine Depression/stress:  anxiety Sleep hygiene:  Varies   HISTORY: Onset:  "almost all of my life" Location:  Bi-frontal/temporal, right retro-orbital Quality:  pounding Initial Intensity:  7/10; October: 6/10 Aura:  no Prodrome:  no Associated symptoms:  Nausea, photophobia, phonophobia, osmophobia, rarely vomiting Initial Duration:  Several hours (1 hour with sumatriptan); October: 1 hour Initial Frequency:  3 to 4 days per week; October: 4 days per month Triggers/exacerbating factors:  Light, anxiety, narcotics, fan blowing in face, wine Relieving factors:  sumatriptan Activity:  Usually can function   Past NSAIDS:  Ibuprofen, naproxen Past analgesics:  Tylenol, Excedrin Past abortive triptans:  Maxalt Past muscle relaxants:  Skelaxin Past anti-nausea:  none Past antihypertensive medications:  none Past antidepressant medications:  none Past anticonvulsant medications:  none Past  vitamins/Herbal/Supplements:  none Past antihistamines/decongestants:  Benadryl  PAST MEDICAL HISTORY: Past Medical History:  Diagnosis Date  . Cardiomyopathy ([place])   . Insomnia   . Menopause   . Migraine   . Orthostatic hypotension   . Osteoporosis   . Other malaise and fatigue   . Persistent disorder of initiating or maintaining sleep   . POTS (postural orthostatic tachycardia syndrome)   . Tachycardia, unspecified     MEDICATIONS: Current Outpatient Prescriptions on File Prior to Visit  Medication Sig Dispense Refill  . amphetamine-dextroamphetamine (ADDERALL) 10 MG tablet Take 1 tablet (10 mg total) by mouth 2 (two) times daily. April 2018 60 tablet 0  . calcitonin, salmon, (MIACALCIN/FORTICAL) 200 UNIT/ACT nasal spray U 1 SPR IN ROTATING NOSTRILS UTD  0  . fluticasone (FLONASE) 50 MCG/ACT nasal spray Place 2 sprays into both nostrils daily. 16 g 6  . NUCYNTA 50 MG tablet TK 1 T PO Q 6 H PRN  0  . propranolol (INDERAL) 20 MG tablet TAKE 1 TABLET (20 MG TOTAL) BY MOUTH 3 (THREE) TIMES DAILY. 90 tablet 5  . SUMAtriptan (IMITREX) 100 MG tablet SEE NOTES 12 tablet 3  . tiZANidine (ZANAFLEX) 4 MG tablet Take 4 mg by mouth as needed.     . topiramate (TOPAMAX) 100 MG tablet TAKE 1 TABLET(100 MG) BY MOUTH TWICE DAILY 60 tablet 0   No current facility-administered medications on file prior to visit.     ALLERGIES: Allergies  Allergen Reactions  . Forteo [Teriparatide (Recombinant)] Other (See Comments)    Bone pain and headaches  . Fosamax [Alendronate Sodium]   . Prolia [Denosumab] Other (See Comments)    Flu like symptoms  . Vicodin [Hydrocodone-Acetaminophen] Nausea And Vomiting  . Codeine Nausea And Vomiting  FAMILY HISTORY: Family History  Problem Relation Age of Onset  . Coronary artery disease Father        Died of MI at age 13  . Asthma Father   . Hypertension Father   . Sudden death Father   . Heart attack Father   . Stroke Mother   . Diabetes Neg Hx   .  Hyperlipidemia Neg Hx     SOCIAL HISTORY: Social History   Social History  . Marital status: Married    Spouse name: N/A  . Number of children: 2  . Years of education: N/A   Occupational History  .      Medial lab technologist   Social History Main Topics  . Smoking status: Never Smoker  . Smokeless tobacco: Never Used  . Alcohol use No  . Drug use: Unknown  . Sexual activity: Yes    Partners: Male   Other Topics Concern  . Not on file   Social History Narrative  . No narrative on file    REVIEW OF SYSTEMS: Constitutional: No fevers, chills, or sweats, no generalized fatigue, change in appetite Eyes: No visual changes, double vision, eye pain Ear, nose and throat: No hearing loss, ear pain, nasal congestion, sore throat Cardiovascular: No chest pain, palpitations Respiratory:  No shortness of breath at rest or with exertion, wheezes GastrointestinaI: No nausea, vomiting, diarrhea, abdominal pain, fecal incontinence Genitourinary:  No dysuria, urinary retention or frequency Musculoskeletal:  No neck pain, back pain Integumentary: No rash, pruritus, skin lesions Neurological: as above Psychiatric: No depression, insomnia, anxiety Endocrine: No palpitations, fatigue, diaphoresis, mood swings, change in appetite, change in weight, increased thirst Hematologic/Lymphatic:  No purpura, petechiae. Allergic/Immunologic: no itchy/runny eyes, nasal congestion, recent allergic reactions, rashes  PHYSICAL EXAM: Vitals:   12/10/16 1120  BP: (!) 88/60  Pulse: 88   General: No acute distress.  Patient appears well-groomed.  normal body habitus. Head:  Normocephalic/atraumatic Eyes:  Fundi examined but not visualized Neck: supple, no paraspinal tenderness, full range of motion Heart:  Regular rate and rhythm Lungs:  Clear to auscultation bilaterally Back: No paraspinal tenderness Neurological Exam: alert and oriented to person, place, and time. Attention span and  concentration intact, recent and remote memory intact, fund of knowledge intact.  Speech fluent and not dysarthric, language intact.  CN II-XII intact. Bulk and tone normal, muscle strength 5/5 throughout.  Sensation to light touch, temperature and vibration intact.  Deep tendon reflexes 2+ throughout, toes downgoing.  Finger to nose and heel to shin testing intact.  Gait normal, Romberg negative.  IMPRESSION: Migraine  PLAN: 1.   Continue topiramate 100mg  twice daily 2.  Use sumatriptan if needed 3.  Continue your healthy lifestyle.  Increase water intake 4.  Follow up in 8 to 9 months.  Metta Clines, DO  CC:  Roma Schanz, DO

## 2016-12-13 ENCOUNTER — Other Ambulatory Visit: Payer: Self-pay | Admitting: Neurology

## 2016-12-13 NOTE — Telephone Encounter (Signed)
Rx sent 

## 2016-12-14 MED FILL — PROPRANOLOL 20 MG TABLET: 20 | 30 days supply | Qty: 90 | Fill #5

## 2016-12-15 DIAGNOSIS — M546 Pain in thoracic spine: Secondary | ICD-10-CM | POA: Diagnosis not present

## 2016-12-15 DIAGNOSIS — M5136 Other intervertebral disc degeneration, lumbar region: Secondary | ICD-10-CM | POA: Diagnosis not present

## 2016-12-15 DIAGNOSIS — M542 Cervicalgia: Secondary | ICD-10-CM | POA: Diagnosis not present

## 2016-12-25 ENCOUNTER — Ambulatory Visit (INDEPENDENT_AMBULATORY_CARE_PROVIDER_SITE_OTHER): Payer: BLUE CROSS/BLUE SHIELD | Admitting: Family Medicine

## 2016-12-25 ENCOUNTER — Encounter: Payer: Self-pay | Admitting: Family Medicine

## 2016-12-25 VITALS — BP 122/70 | HR 83 | Temp 97.6°F | Resp 16 | Ht 69.4 in | Wt 123.0 lb

## 2016-12-25 DIAGNOSIS — Z86010 Personal history of colonic polyps: Secondary | ICD-10-CM | POA: Diagnosis not present

## 2016-12-25 DIAGNOSIS — Z0000 Encounter for general adult medical examination without abnormal findings: Secondary | ICD-10-CM | POA: Diagnosis not present

## 2016-12-25 NOTE — Patient Instructions (Signed)
Preventive Care 40-64 Years, Female Preventive care refers to lifestyle choices and visits with your health care provider that can promote health and wellness. What does preventive care include?  A yearly physical exam. This is also called an annual well check.  Dental exams once or twice a year.  Routine eye exams. Ask your health care provider how often you should have your eyes checked.  Personal lifestyle choices, including: ? Daily care of your teeth and gums. ? Regular physical activity. ? Eating a healthy diet. ? Avoiding tobacco and drug use. ? Limiting alcohol use. ? Practicing safe sex. ? Taking low-dose aspirin daily starting at age 58. ? Taking vitamin and mineral supplements as recommended by your health care provider. What happens during an annual well check? The services and screenings done by your health care provider during your annual well check will depend on your age, overall health, lifestyle risk factors, and family history of disease. Counseling Your health care provider may ask you questions about your:  Alcohol use.  Tobacco use.  Drug use.  Emotional well-being.  Home and relationship well-being.  Sexual activity.  Eating habits.  Work and work Statistician.  Method of birth control.  Menstrual cycle.  Pregnancy history.  Screening You may have the following tests or measurements:  Height, weight, and BMI.  Blood pressure.  Lipid and cholesterol levels. These may be checked every 5 years, or more frequently if you are over 81 years old.  Skin check.  Lung cancer screening. You may have this screening every year starting at age 78 if you have a 30-pack-year history of smoking and currently smoke or have quit within the past 15 years.  Fecal occult blood test (FOBT) of the stool. You may have this test every year starting at age 65.  Flexible sigmoidoscopy or colonoscopy. You may have a sigmoidoscopy every 5 years or a colonoscopy  every 10 years starting at age 30.  Hepatitis C blood test.  Hepatitis B blood test.  Sexually transmitted disease (STD) testing.  Diabetes screening. This is done by checking your blood sugar (glucose) after you have not eaten for a while (fasting). You may have this done every 1-3 years.  Mammogram. This may be done every 1-2 years. Talk to your health care provider about when you should start having regular mammograms. This may depend on whether you have a family history of breast cancer.  BRCA-related cancer screening. This may be done if you have a family history of breast, ovarian, tubal, or peritoneal cancers.  Pelvic exam and Pap test. This may be done every 3 years starting at age 80. Starting at age 36, this may be done every 5 years if you have a Pap test in combination with an HPV test.  Bone density scan. This is done to screen for osteoporosis. You may have this scan if you are at high risk for osteoporosis.  Discuss your test results, treatment options, and if necessary, the need for more tests with your health care provider. Vaccines Your health care provider may recommend certain vaccines, such as:  Influenza vaccine. This is recommended every year.  Tetanus, diphtheria, and acellular pertussis (Tdap, Td) vaccine. You may need a Td booster every 10 years.  Varicella vaccine. You may need this if you have not been vaccinated.  Zoster vaccine. You may need this after age 5.  Measles, mumps, and rubella (MMR) vaccine. You may need at least one dose of MMR if you were born in  1956 or later. You may also need a second dose.  Pneumococcal 13-valent conjugate (PCV13) vaccine. You may need this if you have certain conditions and were not previously vaccinated.  Pneumococcal polysaccharide (PPSV23) vaccine. You may need one or two doses if you smoke cigarettes or if you have certain conditions.  Meningococcal vaccine. You may need this if you have certain  conditions.  Hepatitis A vaccine. You may need this if you have certain conditions or if you travel or work in places where you may be exposed to hepatitis A.  Hepatitis B vaccine. You may need this if you have certain conditions or if you travel or work in places where you may be exposed to hepatitis B.  Haemophilus influenzae type b (Hib) vaccine. You may need this if you have certain conditions.  Talk to your health care provider about which screenings and vaccines you need and how often you need them. This information is not intended to replace advice given to you by your health care provider. Make sure you discuss any questions you have with your health care provider. Document Released: 08/05/2015 Document Revised: 03/28/2016 Document Reviewed: 05/10/2015 Elsevier Interactive Patient Education  2017 [employer].

## 2016-12-25 NOTE — Progress Notes (Signed)
Subjective:   I acted as a Education administrator for Dr. Carollee Herter.  Tracy Cook, CMA   [name] is a 61 y.o. female and is here for a comprehensive physical exam. The patient reports no problems.  Social History   Social History  . Marital status: Married    Spouse name: N/A  . Number of children: 2  . Years of education: N/A   Occupational History  .      Medial lab technologist   Social History Main Topics  . Smoking status: Never Smoker  . Smokeless tobacco: Never Used  . Alcohol use No  . Drug use: No  . Sexual activity: Yes    Partners: Male   Other Topics Concern  . Not on file   Social History Narrative   Exercise almost every day      Health Maintenance  Topic Date Due  . COLONOSCOPY  04/10/2015  . MAMMOGRAM  09/11/2015  . INFLUENZA VACCINE  04/23/2017 (Originally 02/20/2017)  . PAP SMEAR  11/10/2018  . TETANUS/TDAP  05/08/2022  . Hepatitis C Screening  Completed  . HIV Screening  Addressed    The following portions of the patient's history were reviewed and updated as appropriate:  She  has a past medical history of Cardiomyopathy ([place]); Insomnia; Menopause; Migraine; Orthostatic hypotension; Osteoporosis; Other malaise and fatigue; Persistent disorder of initiating or maintaining sleep; POTS (postural orthostatic tachycardia syndrome); and Tachycardia, unspecified. She  does not have any pertinent problems on file. She  has no past surgical history on file. Her family history includes Asthma in her father; Coronary artery disease in her father; Heart attack in her father; Hypertension in her father; Stroke in her mother; Sudden death in her father. She  reports that she has never smoked. She has never used smokeless tobacco. She reports that she does not drink alcohol or use drugs. She has a current medication list which includes the following prescription(s): amphetamine-dextroamphetamine, calcitonin (salmon), fluticasone, nucynta, propranolol, sumatriptan,  tizanidine, and topiramate. Current Outpatient Prescriptions on File Prior to Visit  Medication Sig Dispense Refill  . amphetamine-dextroamphetamine (ADDERALL) 10 MG tablet Take 1 tablet (10 mg total) by mouth 2 (two) times daily. April 2018 60 tablet 0  . calcitonin, salmon, (MIACALCIN/FORTICAL) 200 UNIT/ACT nasal spray U 1 SPR IN ROTATING NOSTRILS UTD  0  . fluticasone (FLONASE) 50 MCG/ACT nasal spray Place 2 sprays into both nostrils daily. 16 g 6  . NUCYNTA 50 MG tablet TK 1 T PO Q 6 H PRN  0  . propranolol (INDERAL) 20 MG tablet TAKE 1 TABLET (20 MG TOTAL) BY MOUTH 3 (THREE) TIMES DAILY. 90 tablet 5  . SUMAtriptan (IMITREX) 100 MG tablet SEE NOTES 12 tablet 3  . tiZANidine (ZANAFLEX) 4 MG tablet Take 4 mg by mouth as needed.     . topiramate (TOPAMAX) 100 MG tablet TAKE 1 TABLET(100 MG) BY MOUTH TWICE DAILY 60 tablet 5   No current facility-administered medications on file prior to visit.    She is allergic to forteo [teriparatide (recombinant)]; fosamax [alendronate sodium]; prolia [denosumab]; vicodin [hydrocodone-acetaminophen]; and codeine..  Review of Systems Review of Systems  Constitutional: Negative for activity change, appetite change and fatigue.  HENT: Negative for hearing loss, congestion, tinnitus and ear discharge.  dentist q47m Eyes: Negative for visual disturbance (see optho q1y -- vision corrected to 20/20 with glasses).  Respiratory: Negative for cough, chest tightness and shortness of breath.   Cardiovascular: Negative for chest pain, palpitations and leg swelling.  Gastrointestinal: Negative for abdominal pain, diarrhea, constipation and abdominal distention.  Genitourinary: Negative for urgency, frequency, decreased urine volume and difficulty urinating.  Musculoskeletal: Negative for back pain, arthralgias and gait problem.  Skin: Negative for color change, pallor and rash.  Neurological: Negative for dizziness, light-headedness, numbness and headaches.   Hematological: Negative for adenopathy. Does not bruise/bleed easily.  Psychiatric/Behavioral: Negative for suicidal ideas, confusion, sleep disturbance, self-injury, dysphoric mood, decreased concentration and agitation.       Objective:    BP 122/70 (BP Location: Left Arm, Cuff Size: Normal)   Pulse 83   Temp 97.6 F (36.4 C) (Oral)   Resp 16   Ht 5' 9.4" (1.763 m)   Wt 123 lb (55.8 kg)   SpO2 99%   BMI 17.96 kg/m  General appearance: alert, cooperative, appears stated age and no distress Head: Normocephalic, without obvious abnormality, atraumatic Eyes: conjunctivae/corneas clear. PERRL, EOM's intact. Fundi benign. Ears: normal TM's and external ear canals both ears Nose: Nares normal. Septum midline. Mucosa normal. No drainage or sinus tenderness. Throat: lips, mucosa, and tongue normal; teeth and gums normal Neck: no adenopathy, no carotid bruit, no JVD, supple, symmetrical, trachea midline and thyroid not enlarged, symmetric, no tenderness/mass/nodules Back: symmetric, no curvature. ROM normal. No CVA tenderness. Lungs: clear to auscultation bilaterally Breasts: normal appearance, no masses or tenderness Heart: regular rate and rhythm, S1, S2 normal, no murmur, click, rub or gallop Abdomen: soft, non-tender; bowel sounds normal; no masses,  no organomegaly Pelvic: deferred Extremities: extremities normal, atraumatic, no cyanosis or edema Pulses: 2+ and symmetric Skin: Skin color, texture, turgor normal. No rashes or lesions Lymph nodes: Cervical, supraclavicular, and axillary nodes normal. Neurologic: Alert and oriented X 3, normal strength and tone. Normal symmetric reflexes. Normal coordination and gait    Assessment:    Healthy female exam.      Plan:     See After Visit Summary for Counseling Recommendations   ghm utd Check labs 1. Preventative health care    2. History of colonic polyps   - Ambulatory referral to Gastroenterology

## 2016-12-31 ENCOUNTER — Encounter: Payer: Self-pay | Admitting: Family Medicine

## 2017-01-07 DIAGNOSIS — S22000A Wedge compression fracture of unspecified thoracic vertebra, initial encounter for closed fracture: Secondary | ICD-10-CM | POA: Diagnosis not present

## 2017-01-07 DIAGNOSIS — R030 Elevated blood-pressure reading, without diagnosis of hypertension: Secondary | ICD-10-CM | POA: Diagnosis not present

## 2017-01-07 DIAGNOSIS — Z681 Body mass index (BMI) 19 or less, adult: Secondary | ICD-10-CM | POA: Diagnosis not present

## 2017-01-08 ENCOUNTER — Other Ambulatory Visit: Payer: Self-pay | Admitting: Family Medicine

## 2017-01-08 DIAGNOSIS — Z1231 Encounter for screening mammogram for malignant neoplasm of breast: Secondary | ICD-10-CM

## 2017-01-10 ENCOUNTER — Ambulatory Visit (HOSPITAL_BASED_OUTPATIENT_CLINIC_OR_DEPARTMENT_OTHER)
Admission: RE | Admit: 2017-01-10 | Discharge: 2017-01-10 | Disposition: A | Discharge status: Home / Self Care | Payer: BLUE CROSS/BLUE SHIELD | Source: Ambulatory Visit | Attending: Family Medicine | Admitting: Family Medicine

## 2017-01-10 DIAGNOSIS — Z1231 Encounter for screening mammogram for malignant neoplasm of breast: Secondary | ICD-10-CM

## 2017-01-11 ENCOUNTER — Encounter: Payer: Self-pay | Admitting: Family Medicine

## 2017-01-11 ENCOUNTER — Ambulatory Visit (INDEPENDENT_AMBULATORY_CARE_PROVIDER_SITE_OTHER): Payer: BLUE CROSS/BLUE SHIELD | Admitting: Family Medicine

## 2017-01-11 VITALS — BP 122/78 | HR 74 | Temp 98.0°F | Resp 14 | Ht 69.0 in | Wt 122.4 lb

## 2017-01-11 DIAGNOSIS — M8000XA Age-related osteoporosis with current pathological fracture, unspecified site, initial encounter for fracture: Secondary | ICD-10-CM

## 2017-01-11 LAB — COMPREHENSIVE METABOLIC PANEL
ALT: 26 U/L (ref 0–35)
AST: 26 U/L (ref 0–37)
Albumin: 4.2 g/dL (ref 3.5–5.2)
Alkaline Phosphatase: 42 U/L (ref 39–117)
BUN: 15 mg/dL (ref 6–23)
CO2: 27 mEq/L (ref 19–32)
Calcium: 9.6 mg/dL (ref 8.4–10.5)
Chloride: 107 mEq/L (ref 96–112)
Creatinine, Ser: 0.82 mg/dL (ref 0.40–1.20)
GFR: 75.62 mL/min (ref >60.00)
Glucose, Bld: 102 mg/dL — ABNORMAL HIGH (ref 70–99)
Potassium: 3.9 mEq/L (ref 3.5–5.1)
Sodium: 141 mEq/L (ref 135–145)
Total Bilirubin: 0.3 mg/dL (ref 0.2–1.2)
Total Protein: 6.7 g/dL (ref 6.0–8.3)

## 2017-01-11 NOTE — Progress Notes (Signed)
Patient ID: Tracy Cook, female    DOB: 1955-08-06  Age: 61 y.o. MRN: [id number]    Subjective:  Subjective  HPI ADREA SHERPA presents to discuss osteoporosis-- the neurosurgeon convinced her she really did need medication    She is willing to try prolia again.    Review of Systems  Constitutional: Negative for activity change, appetite change, fatigue and unexpected weight change.  Respiratory: Negative for cough and shortness of breath.   Cardiovascular: Negative for chest pain and palpitations.  Psychiatric/Behavioral: Negative for behavioral problems and dysphoric mood. The patient is not nervous/anxious.     History Past Medical History:  Diagnosis Date  . Cardiomyopathy ([place])   . Insomnia   . Menopause   . Migraine   . Orthostatic hypotension   . Osteoporosis   . Other malaise and fatigue   . Persistent disorder of initiating or maintaining sleep   . POTS (postural orthostatic tachycardia syndrome)   . Tachycardia, unspecified     She has no past surgical history on file.   Her family history includes Asthma in her father; Coronary artery disease in her father; Heart attack in her father; Hypertension in her father; Stroke in her mother; Sudden death in her father.She reports that she has never smoked. She has never used smokeless tobacco. She reports that she does not drink alcohol or use drugs.  Current Outpatient Prescriptions on File Prior to Visit  Medication Sig Dispense Refill  . amphetamine-dextroamphetamine (ADDERALL) 10 MG tablet Take 1 tablet (10 mg total) by mouth 2 (two) times daily. April 2018 60 tablet 0  . calcitonin, salmon, (MIACALCIN/FORTICAL) 200 UNIT/ACT nasal spray U 1 SPR IN ROTATING NOSTRILS UTD  0  . fluticasone (FLONASE) 50 MCG/ACT nasal spray Place 2 sprays into both nostrils daily. 16 g 6  . NUCYNTA 50 MG tablet TK 1 T PO Q 6 H PRN  0  . propranolol (INDERAL) 20 MG tablet TAKE 1 TABLET (20 MG TOTAL) BY MOUTH 3 (THREE) TIMES DAILY. 90  tablet 5  . SUMAtriptan (IMITREX) 100 MG tablet SEE NOTES 12 tablet 3  . tiZANidine (ZANAFLEX) 4 MG tablet Take 4 mg by mouth as needed.     . topiramate (TOPAMAX) 100 MG tablet TAKE 1 TABLET(100 MG) BY MOUTH TWICE DAILY 60 tablet 5   No current facility-administered medications on file prior to visit.      Objective:  Objective  Physical Exam  Constitutional: She is oriented to person, place, and time. She appears well-developed and well-nourished.  HENT:  Head: Normocephalic and atraumatic.  Eyes: Conjunctivae and EOM are normal.  Neck: Normal range of motion. Neck supple. No JVD present. Carotid bruit is not present. No thyromegaly present.  Cardiovascular: Normal rate, regular rhythm and normal heart sounds.   No murmur heard. Pulmonary/Chest: Effort normal and breath sounds normal. No respiratory distress. She has no wheezes. She has no rales. She exhibits no tenderness.  Musculoskeletal: She exhibits no edema.  Neurological: She is alert and oriented to person, place, and time.  Psychiatric: She has a normal mood and affect.  Nursing note and vitals reviewed.  BP 122/78 (BP Location: Left Arm, Patient Position: Sitting, Cuff Size: Small)   Pulse 74   Temp 98 F (36.7 C) (Oral)   Resp 14   Ht 5\' 9"  (1.753 m)   Wt 122 lb 6 oz (55.5 kg)   SpO2 92%   BMI 18.07 kg/m  Wt Readings from Last 3 Encounters:  01/11/17  122 lb 6 oz (55.5 kg)  12/25/16 123 lb (55.8 kg)  12/10/16 123 lb (55.8 kg)     Lab Results  Component Value Date   WBC 10.5 11/13/2016   HGB 13.7 11/13/2016   HCT 41.0 11/13/2016   PLT 278.0 11/13/2016   GLUCOSE 110 (H) 11/13/2016   CHOL 117 11/13/2016   TRIG 66.0 11/13/2016   HDL 57.50 11/13/2016   LDLCALC 47 11/13/2016   ALT 20 11/13/2016   AST 22 11/13/2016   NA 139 11/13/2016   K 3.9 11/13/2016   CL 107 11/13/2016   CREATININE 0.84 11/13/2016   BUN 13 11/13/2016   CO2 26 11/13/2016   TSH 0.41 11/13/2016   MICROALBUR <0.7 11/13/2016    Mm  Screening Breast Tomo Bilateral  Result Date: 01/10/2017 CLINICAL DATA:  Screening. EXAM: 2D DIGITAL SCREENING BILATERAL MAMMOGRAM WITH CAD AND ADJUNCT TOMO COMPARISON:  Previous exam(s). ACR Breast Density Category c: The breast tissue is heterogeneously dense, which may obscure small masses. FINDINGS: There are no findings suspicious for malignancy. Images were processed with CAD. IMPRESSION: No mammographic evidence of malignancy. A result letter of this screening mammogram will be mailed directly to the patient. RECOMMENDATION: Screening mammogram in one year. (Code:SM-B-01Y) BI-RADS CATEGORY  1: Negative. Electronically Signed   By: Lajean Manes M.D.   On: 01/10/2017 13:44     Assessment & Plan:  Plan  I am having Ms. Righi maintain her tiZANidine, propranolol, NUCYNTA, calcitonin (salmon), amphetamine-dextroamphetamine, fluticasone, SUMAtriptan, and topiramate.  No orders of the defined types were placed in this encounter.   Problem List Items Addressed This Visit      Unprioritized   Osteoporosis - Primary   Relevant Orders   Comprehensive metabolic panel    check labs and will get approval for prolia and call pt   Follow-up: Return if symptoms worsen or fail to improve.  [name], DO    Pre visit review using our clinic review tool, if applicable. No additional management support is needed unless otherwise documented below in the visit note.

## 2017-01-11 NOTE — Patient Instructions (Signed)

## 2017-01-15 ENCOUNTER — Telehealth: Payer: Self-pay | Admitting: *Deleted

## 2017-01-15 NOTE — Telephone Encounter (Signed)
Patient had lab work for [employer], but I did not see where we approved it by her ins.  Can you check this.  She has an appt in July for an injection.

## 2017-01-18 ENCOUNTER — Other Ambulatory Visit: Payer: Self-pay | Admitting: Family Medicine

## 2017-01-18 DIAGNOSIS — I429 Cardiomyopathy, unspecified: Secondary | ICD-10-CM

## 2017-01-18 MED FILL — PROPRANOLOL 20 MG TABLET: 20 | 30 days supply | Qty: 90 | Fill #0

## 2017-01-24 NOTE — Telephone Encounter (Signed)
Tracy Cook / [name]-- do we have approval from insurance and is pt aware of potential costs before I order injection?  We may need to r/s pt's appt if we do not have approval / confirmation from pt / insurance re: approval / pt financial responsibility. I do not usually order the medication until all of that has been done. Thanks!

## 2017-01-29 ENCOUNTER — Encounter: Payer: Self-pay | Admitting: Family Medicine

## 2017-02-01 NOTE — Telephone Encounter (Signed)
I have requested Prolia benefits, still awaiting verifiation

## 2017-02-06 ENCOUNTER — Telehealth: Payer: Self-pay

## 2017-02-06 NOTE — Telephone Encounter (Signed)
Called patient to inform her that Shingrix vaccine is not in yet and she will be called after patients who have had their first doses have been given Patient understood.

## 2017-02-07 ENCOUNTER — Other Ambulatory Visit (INDEPENDENT_AMBULATORY_CARE_PROVIDER_SITE_OTHER): Payer: BLUE CROSS/BLUE SHIELD

## 2017-02-07 ENCOUNTER — Ambulatory Visit: Payer: BLUE CROSS/BLUE SHIELD

## 2017-02-07 DIAGNOSIS — R739 Hyperglycemia, unspecified: Secondary | ICD-10-CM

## 2017-02-07 LAB — COMPREHENSIVE METABOLIC PANEL
ALT: 24 U/L (ref 0–35)
AST: 22 U/L (ref 0–37)
Albumin: 4.5 g/dL (ref 3.5–5.2)
Alkaline Phosphatase: 53 U/L (ref 39–117)
BUN: 14 mg/dL (ref 6–23)
CO2: 30 mEq/L (ref 19–32)
Calcium: 9.9 mg/dL (ref 8.4–10.5)
Chloride: 105 mEq/L (ref 96–112)
Creatinine, Ser: 0.85 mg/dL (ref 0.40–1.20)
GFR: 72.53 mL/min (ref >60.00)
Glucose, Bld: 97 mg/dL (ref 70–99)
Potassium: 3.6 mEq/L (ref 3.5–5.1)
Sodium: 141 mEq/L (ref 135–145)
Total Bilirubin: 0.5 mg/dL (ref 0.2–1.2)
Total Protein: 7.1 g/dL (ref 6.0–8.3)

## 2017-02-07 LAB — HEMOGLOBIN A1C: Hgb A1c MFr Bld: 5.8 % (ref 4.6–6.5)

## 2017-02-07 NOTE — Telephone Encounter (Signed)
Prolia benefits verified PA is required- will initiate if patient is interested Admin and prolia subject to $4000 ded-met 20% con-insurance up to $5000 OOP max  Patient may owe approximately $220 OOP  If patient is interested I will initate PA

## 2017-02-08 NOTE — Telephone Encounter (Signed)
Patient informed and does want the PA for prolia initiated.

## 2017-02-11 NOTE — Telephone Encounter (Signed)
Insurance called. PA initiated.  Currently pending review.  Forms to be faxed to [phone] for completion.  Awaiting fax.

## 2017-02-12 ENCOUNTER — Ambulatory Visit: Payer: BC Managed Care – PPO

## 2017-02-12 ENCOUNTER — Other Ambulatory Visit: Payer: BC Managed Care – PPO

## 2017-02-13 NOTE — Telephone Encounter (Signed)
Tracy Cook from [employer] checking on the status if clinical form was faxed over verifying if patient "qualifies for prolia" please fax to # [phone] phone # [phone] reference # [id number]

## 2017-02-13 NOTE — Telephone Encounter (Signed)
Fax received and completed.  Forwarded to PCP for review and signature.

## 2017-02-14 NOTE — Telephone Encounter (Signed)
Form signed by provider. Faxed to [place] at [phone].  Awaiting response.

## 2017-02-15 MED FILL — PROPRANOLOL 20 MG TABLET: 20 | 30 days supply | Qty: 90 | Fill #1

## 2017-02-19 ENCOUNTER — Other Ambulatory Visit: Payer: Self-pay | Admitting: Neurology

## 2017-02-27 NOTE — Telephone Encounter (Signed)
Jordan---any updates?

## 2017-02-28 NOTE — Telephone Encounter (Signed)
Received Authorization for Prolia  Reference # [id number] 02/12/17-02/12/18

## 2017-03-04 ENCOUNTER — Encounter: Payer: Self-pay | Admitting: Family Medicine

## 2017-03-04 DIAGNOSIS — F988 Other specified behavioral and emotional disorders with onset usually occurring in childhood and adolescence: Secondary | ICD-10-CM

## 2017-03-05 MED ORDER — AMPHETAMINE-DEXTROAMPHETAMINE 10 MG PO TABS: 10.0000 mg | ORAL_TABLET | Freq: Two times a day (BID) | ORAL | 0 refills | Status: DC

## 2017-03-05 NOTE — Telephone Encounter (Signed)
Patient requesting refill for Adderall  Last filled per database:  02/13/17 Last written: 10/26/16 Last ov: 10/26/16  Next ov: due in Oct Contract: 04/23/16 UDS: due now

## 2017-03-05 NOTE — Telephone Encounter (Signed)
rx's printed and database printed.

## 2017-03-05 NOTE — Telephone Encounter (Signed)
Ok to post date 3 months

## 2017-03-11 NOTE — Telephone Encounter (Signed)
Pt has a nurse visit appt on 03/26/17 @ 9:15am.  Tricia-please order Prolia injection.

## 2017-03-13 NOTE — Telephone Encounter (Signed)
Prolia has been ordered

## 2017-03-18 NOTE — Telephone Encounter (Signed)
Prolia received and in fridge for pt. 

## 2017-03-22 MED FILL — PROPRANOLOL 20 MG TABLET: 20 | 30 days supply | Qty: 90 | Fill #2

## 2017-03-26 ENCOUNTER — Ambulatory Visit: Payer: BLUE CROSS/BLUE SHIELD

## 2017-04-02 ENCOUNTER — Ambulatory Visit (INDEPENDENT_AMBULATORY_CARE_PROVIDER_SITE_OTHER): Payer: BLUE CROSS/BLUE SHIELD

## 2017-04-02 DIAGNOSIS — M818 Other osteoporosis without current pathological fracture: Secondary | ICD-10-CM | POA: Diagnosis not present

## 2017-04-02 MED ORDER — DENOSUMAB 60 MG/ML ~~LOC~~ SOLN
60.0000 mg | Freq: Once | SUBCUTANEOUS | Status: AC
  Administered 2017-04-02: 60 mg via SUBCUTANEOUS

## 2017-04-02 NOTE — Progress Notes (Signed)
Pre visit review using our clinic tool,if applicable. No additional management support is needed unless otherwise documented below in the visit note.   Tracy Cook in for Prolia injection per order from Dr. Carollee Herter dated 01/11/17.  No complaints voiced this visit.   Tracy Cook given Prolia 60 mg sq left arm. Tracy Cook tolerated well. Reminder card given to Tracy Cook for 6 months.

## 2017-04-18 MED FILL — PROPRANOLOL 20 MG TABLET: 20 | 30 days supply | Qty: 90 | Fill #3

## 2017-05-08 ENCOUNTER — Other Ambulatory Visit: Payer: Self-pay | Admitting: Neurology

## 2017-05-31 MED FILL — PROPRANOLOL 20 MG TABLET: 20 | 30 days supply | Qty: 90 | Fill #4

## 2017-06-03 ENCOUNTER — Encounter: Payer: Self-pay | Admitting: Family Medicine

## 2017-06-03 DIAGNOSIS — F988 Other specified behavioral and emotional disorders with onset usually occurring in childhood and adolescence: Secondary | ICD-10-CM

## 2017-06-04 NOTE — Telephone Encounter (Signed)
Pt is requesting refill on Adderall 10mg . Pt requesting several days early d/t Thanksgiving holiday next week.   Last OV: 12/25/2016 Last Fill: 03/05/2017 #60 and 0RF (For August, September, and October 2018) UDS: 07/10/2016 Low risk  NCCR printed; placed on ledge.   Please advise.

## 2017-06-04 NOTE — Telephone Encounter (Signed)
Ok to refill 3 months

## 2017-06-05 DIAGNOSIS — M5136 Other intervertebral disc degeneration, lumbar region: Secondary | ICD-10-CM | POA: Diagnosis not present

## 2017-06-06 MED ORDER — AMPHETAMINE-DEXTROAMPHETAMINE 10 MG PO TABS: 10.0000 mg | ORAL_TABLET | Freq: Two times a day (BID) | ORAL | 0 refills | Status: DC

## 2017-06-06 NOTE — Telephone Encounter (Signed)
Pt informed via MyChart that Rx's have been placed at the front desk for pick up at her convenience.

## 2017-06-06 NOTE — Telephone Encounter (Signed)
Rx's printed for November, December 2018, and January 2019, awaiting DO signature.

## 2017-06-07 ENCOUNTER — Other Ambulatory Visit: Payer: Self-pay | Admitting: Neurology

## 2017-07-08 MED FILL — PROPRANOLOL 20 MG TABLET: 20 | 30 days supply | Qty: 90 | Fill #5

## 2017-08-15 ENCOUNTER — Other Ambulatory Visit: Payer: Self-pay | Admitting: Neurology

## 2017-09-11 ENCOUNTER — Encounter: Payer: Self-pay | Admitting: Family Medicine

## 2017-09-11 ENCOUNTER — Other Ambulatory Visit: Payer: Self-pay | Admitting: Family Medicine

## 2017-09-11 DIAGNOSIS — I429 Cardiomyopathy, unspecified: Secondary | ICD-10-CM

## 2017-09-12 ENCOUNTER — Telehealth: Payer: Self-pay | Admitting: Family Medicine

## 2017-09-12 ENCOUNTER — Ambulatory Visit (INDEPENDENT_AMBULATORY_CARE_PROVIDER_SITE_OTHER): Payer: BLUE CROSS/BLUE SHIELD | Admitting: Neurology

## 2017-09-12 ENCOUNTER — Encounter: Payer: Self-pay | Admitting: Neurology

## 2017-09-12 VITALS — BP 104/82 | HR 83 | Ht 69.5 in | Wt 125.0 lb

## 2017-09-12 DIAGNOSIS — G43009 Migraine without aura, not intractable, without status migrainosus: Secondary | ICD-10-CM

## 2017-09-12 MED ORDER — TOPIRAMATE 50 MG PO TABS: 100.0000 mg | ORAL_TABLET | Freq: Every day | ORAL | 2 refills | Status: DC

## 2017-09-12 MED FILL — PROPRANOLOL 20 MG TABLET: 20 | 30 days supply | Qty: 90 | Fill #0

## 2017-09-12 NOTE — Patient Instructions (Signed)
1.  I will prescribe you topiramate 50mg  tablets:  Take 2 tablets at bedtime for 1 month  Then 1 tablet at bedtime for 1 month  Then 1/2 tablet at bedtime for 1 month  Then stop. You may taper off more slowly if you would like. 2.  Keep headache diary 3.  Use sumatriptan as needed, limited to no more than 2 days out of week to prevent rebound headache. 4.  Follow up in 3 months.

## 2017-09-12 NOTE — Progress Notes (Signed)
NEUROLOGY FOLLOW UP OFFICE NOTE  Tracy Cook [id number]  HISTORY OF PRESENT ILLNESS: Tracy Cook is a 62 year old right-handed female with POTS who follows up for migraine.   UPDATE: Since last visit, she has been trying to slowly taper off the topiramate.  She went down to 125mg  at bedtime.  She started 100mg  at bedtime last night.  More recently, she will have left suboccipital pain that radiates up to the front of her head.  She has associated neck pain.  She has been seeing a massage therapist. Intensity:  6/10 Duration:  1 hour Frequency:  On average 2-4 days a month.  Sometimes she may have a worse month with frequent headaches over the course of a week. Current NSAIDS:  none Current analgesics:  none Current triptans:  sumatriptan 50mg  to 100mg  Current anti-nausea:  none Current muscle relaxants:  Tizanidine 4mg  (for neck pain) Antihypertensive medications:  propranolol 20mg  three times daily Antidepressant medications:  none Anticonvulsant medications:  topiramate 125mg   Vitamins/Herbal/Supplements:  Magnesium 400mg  Antihistamines/Decongestants:  None Other therapy:  Massage of neck   Caffeine:  daily Alcohol:  no Smoker:  no Diet:  Diet with little or no diary and wheat. Exercise:  routine Depression:  no; Anxiety:  yes Sleep hygiene:  Varies   HISTORY: Onset:  "almost all of my life" Location:  Bi-frontal/temporal, right retro-orbital Quality:  pounding Initial Intensity:  7/10 Aura:  no Prodrome:  no Associated symptoms:  Nausea, photophobia, phonophobia, osmophobia, rarely vomiting.  She denies autonomic symptoms or unilateral numbness or weakness. Initial Duration:  Several hours (1 hour with sumatriptan) Initial Frequency:  3 to 4 days per week Triggers/exacerbating factors:  Light, anxiety, narcotics, fan blowing in face, wine, narcotics Relieving factors:  sumatriptan Activity:  Usually can function   Past NSAIDS:  Ibuprofen, naproxen Past  analgesics:  Tylenol, Excedrin Past abortive triptans:  Maxalt, Relpax Past muscle relaxants:  Skelaxin Past anti-nausea:  none Past antihypertensive medications:  none Past antidepressant medications:  none Past anticonvulsant medications:  none Past vitamins/Herbal/Supplements:  none Past antihistamines/decongestants:  Benadryl  PAST MEDICAL HISTORY: Past Medical History:  Diagnosis Date  . Cardiomyopathy ([place])   . Insomnia   . Menopause   . Migraine   . Orthostatic hypotension   . Osteoporosis   . Other malaise and fatigue   . Persistent disorder of initiating or maintaining sleep   . POTS (postural orthostatic tachycardia syndrome)   . Tachycardia, unspecified     MEDICATIONS: Current Outpatient Medications on File Prior to Visit  Medication Sig Dispense Refill  . amphetamine-dextroamphetamine (ADDERALL) 10 MG tablet Take 1 tablet (10 mg total) 2 (two) times daily by mouth. 60 tablet 0  . amphetamine-dextroamphetamine (ADDERALL) 10 MG tablet Take 1 tablet (10 mg total) 2 (two) times daily by mouth. 60 tablet 0  . amphetamine-dextroamphetamine (ADDERALL) 10 MG tablet Take 1 tablet (10 mg total) 2 (two) times daily by mouth. 60 tablet 0  . calcitonin, salmon, (MIACALCIN/FORTICAL) 200 UNIT/ACT nasal spray U 1 SPR IN ROTATING NOSTRILS UTD  0  . eletriptan (RELPAX) 40 MG tablet TAKE 1 TABLET BY MOUTH AS NEEDED FOR MIGRAINE OR HEADACHE, MAY REPEAT IN 2 HOURS IF HEADACHE PERSISTS OR RECURS 6 tablet 2  . fluticasone (FLONASE) 50 MCG/ACT nasal spray Place 2 sprays into both nostrils daily. 16 g 6  . NUCYNTA 50 MG tablet TK 1 T PO Q 6 H PRN  0  . propranolol (INDERAL) 20 MG tablet TAKE 1 TABLET (  20 MG TOTAL) BY MOUTH 3 TIMES DAILY. 90 tablet 5  . SUMAtriptan (IMITREX) 100 MG tablet SEE NOTES 12 tablet 0  . tiZANidine (ZANAFLEX) 4 MG tablet Take 4 mg by mouth as needed.      No current facility-administered medications on file prior to visit.     ALLERGIES: Allergies  Allergen  Reactions  . Forteo [Teriparatide (Recombinant)] Other (See Comments)    Bone pain and headaches  . Fosamax [Alendronate Sodium]   . Vicodin [Hydrocodone-Acetaminophen] Nausea And Vomiting  . Codeine Nausea And Vomiting    FAMILY HISTORY: Family History  Problem Relation Age of Onset  . Coronary artery disease Father        Died of MI at age 61  . Asthma Father   . Hypertension Father   . Sudden death Father   . Heart attack Father   . Stroke Mother   . Diabetes Neg Hx   . Hyperlipidemia Neg Hx     SOCIAL HISTORY: Social History   Socioeconomic History  . Marital status: Married    Spouse name: Not on file  . Number of children: 2  . Years of education: Not on file  . Highest education level: Not on file  Social Needs  . Financial resource strain: Not on file  . Food insecurity - worry: Not on file  . Food insecurity - inability: Not on file  . Transportation needs - medical: Not on file  . Transportation needs - non-medical: Not on file  Occupational History    Comment: Medial lab technologist  Tobacco Use  . Smoking status: Never Smoker  . Smokeless tobacco: Never Used  Substance and Sexual Activity  . Alcohol use: No  . Drug use: No  . Sexual activity: Yes    Partners: Male  Other Topics Concern  . Not on file  Social History Narrative   Exercise almost every day    REVIEW OF SYSTEMS: Constitutional: No fevers, chills, or sweats, no generalized fatigue, change in appetite Eyes: No visual changes, double vision, eye pain Ear, nose and throat: No hearing loss, ear pain, nasal congestion, sore throat Cardiovascular: No chest pain, palpitations Respiratory:  No shortness of breath at rest or with exertion, wheezes GastrointestinaI: No nausea, vomiting, diarrhea, abdominal pain, fecal incontinence Genitourinary:  No dysuria, urinary retention or frequency Musculoskeletal:  Neck pain Integumentary: No rash, pruritus, skin lesions Neurological: as  above Psychiatric: No depression, insomnia, anxiety Endocrine: No palpitations, fatigue, diaphoresis, mood swings, change in appetite, change in weight, increased thirst Hematologic/Lymphatic:  No purpura, petechiae. Allergic/Immunologic: no itchy/runny eyes, nasal congestion, recent allergic reactions, rashes  PHYSICAL EXAM: Vitals:   09/12/17 0946  BP: 104/82  Pulse: 83  SpO2: 97%   General: No acute distress.  Patient appears well-groomed.   Head:  Normocephalic/atraumatic Eyes:  Fundi examined but not visualized Neck: supple, no paraspinal tenderness, full range of motion Heart:  Regular rate and rhythm Lungs:  Clear to auscultation bilaterally Back: No paraspinal tenderness Neurological Exam: alert and oriented to person, place, and time. Attention span and concentration intact, recent and remote memory intact, fund of knowledge intact.  Speech fluent and not dysarthric, language intact.  CN II-XII intact. Bulk and tone normal, muscle strength 5/5 throughout.  Sensation to light touch  intact.  Deep tendon reflexes 2+ throughout.  Finger to nose testing intact.  Gait normal, Romberg negative.  IMPRESSION: Migraine without aura  PLAN: 1.  I will prescribe topiramate 50mg  tablets:  Take 2  tablets at bedtime for 1 month  Then 1 tablet at bedtime for 1 month  Then 1/2 tablet at bedtime for 1 month  Then stop. She may taper off more slowly if you would like. 2.  Keep headache diary 3.  Use sumatriptan as needed, limited to no more than 2 days out of week to prevent rebound headache. 4.  Follow up in 3 months.  Metta Clines, DO  CC:  Roma Schanz, DO

## 2017-09-12 NOTE — Telephone Encounter (Signed)
Prolia benefits received PA is required- On file 02/12/17-02/12/18 #[id number] 20% co-insurance for Prolia and Admin   Patient may owe approximately $220 OOP  Patient due after 09/29/17  Letter mailed to inform patient of benefits and to schedule

## 2017-09-16 ENCOUNTER — Encounter: Payer: Self-pay | Admitting: Family Medicine

## 2017-09-16 DIAGNOSIS — F988 Other specified behavioral and emotional disorders with onset usually occurring in childhood and adolescence: Secondary | ICD-10-CM

## 2017-09-16 DIAGNOSIS — Z79899 Other long term (current) drug therapy: Secondary | ICD-10-CM

## 2017-09-17 MED ORDER — AMPHETAMINE-DEXTROAMPHETAMINE 10 MG PO TABS: 10.0000 mg | ORAL_TABLET | Freq: Two times a day (BID) | ORAL | 0 refills | Status: DC

## 2017-09-17 NOTE — Telephone Encounter (Signed)
Rx printed and placed on PCP's ledge for signature. Notified pt of need for f/u and to update CSC and UDS when she picks up Rx and pt voices understanding. Appointment scheduled for 10/14/17 at [facility] printed and placed with Rx. Future urine order entered. Advised pt she can pick up Rx on 10/16/17.

## 2017-09-17 NOTE — Telephone Encounter (Addendum)
Dr Carollee Herter-- please advise?  Last adderall RX: 3 RXs given 06/06/18 for Nov, Dec and Jan. Last OV: 01/11/17 Next OV: none scheduled UDS: 07/10/16 CSC: 04/23/16 CSR: No discrepancies identified

## 2017-09-17 NOTE — Telephone Encounter (Signed)
Looks like she is overdue for ov--- can refill 1 month

## 2017-09-17 NOTE — Telephone Encounter (Signed)
See 09/16/17 patient email.

## 2017-09-18 ENCOUNTER — Other Ambulatory Visit: Payer: Self-pay | Admitting: Neurology

## 2017-09-18 ENCOUNTER — Other Ambulatory Visit: Payer: BLUE CROSS/BLUE SHIELD

## 2017-09-18 DIAGNOSIS — Z79899 Other long term (current) drug therapy: Secondary | ICD-10-CM

## 2017-09-18 DIAGNOSIS — F988 Other specified behavioral and emotional disorders with onset usually occurring in childhood and adolescence: Secondary | ICD-10-CM | POA: Diagnosis not present

## 2017-09-19 LAB — PAIN MGMT, PROFILE 8 W/CONF, U
6 Acetylmorphine: NEGATIVE ng/mL (ref <10)
Alcohol Metabolites: NEGATIVE ng/mL (ref <500)
Amphetamines: NEGATIVE ng/mL (ref <500)
Benzodiazepines: NEGATIVE ng/mL (ref <100)
Buprenorphine, Urine: NEGATIVE ng/mL (ref <5)
Cocaine Metabolite: NEGATIVE ng/mL (ref <150)
Creatinine: 20.7 mg/dL
MDMA: NEGATIVE ng/mL (ref <500)
Marijuana Metabolite: NEGATIVE ng/mL (ref <20)
Opiates: NEGATIVE ng/mL (ref <100)
Oxidant: NEGATIVE ug/mL (ref <200)
Oxycodone: NEGATIVE ng/mL (ref <100)
pH: 6.65 (ref 4.5–9.0)

## 2017-10-14 ENCOUNTER — Ambulatory Visit (INDEPENDENT_AMBULATORY_CARE_PROVIDER_SITE_OTHER): Payer: BLUE CROSS/BLUE SHIELD | Admitting: Family Medicine

## 2017-10-14 ENCOUNTER — Encounter: Payer: Self-pay | Admitting: Family Medicine

## 2017-10-14 DIAGNOSIS — F988 Other specified behavioral and emotional disorders with onset usually occurring in childhood and adolescence: Secondary | ICD-10-CM

## 2017-10-14 MED ORDER — AMPHETAMINE-DEXTROAMPHETAMINE 10 MG PO TABS: 10.0000 mg | ORAL_TABLET | Freq: Two times a day (BID) | ORAL | 0 refills | Status: DC

## 2017-10-14 NOTE — Progress Notes (Signed)
Patient ID: Tracy Cook, female   DOB: [birth year], 62 y.o.   MRN: [id number]     Subjective:  I acted as a Education administrator for Dr. Carollee Herter.  Tracy Cook, [place]   Patient ID: Tracy Cook, female    DOB: 1956-03-05, 62 y.o.   MRN: [id number]  Chief Complaint  Patient presents with  . ADHD    HPI  Patient is in today for follow up ADHD.  She is doing well on current dose.   Patient Care Team: Carollee Herter, Alferd Apa, DO as PCP - General (Family Medicine) Braden, Danny Lawless, DO as Consulting Physician (Psychiatry) Suella Broad, MD as Consulting Physician (Physical Medicine and Rehabilitation) Pieter Partridge, DO as Consulting Physician (Neurology) Iona Beard, [place] as Referring Physician (Optometry)   Past Medical History:  Diagnosis Date  . Cardiomyopathy ([place])   . Insomnia   . Menopause   . Migraine   . Orthostatic hypotension   . Osteoporosis   . Other malaise and fatigue   . Persistent disorder of initiating or maintaining sleep   . POTS (postural orthostatic tachycardia syndrome)   . Tachycardia, unspecified     No past surgical history on file.  Family History  Problem Relation Age of Onset  . Coronary artery disease Father        Died of MI at age 53  . Asthma Father   . Hypertension Father   . Sudden death Father   . Heart attack Father   . Stroke Mother   . Diabetes Neg Hx   . Hyperlipidemia Neg Hx     Social History   Socioeconomic History  . Marital status: Married    Spouse name: Not on file  . Number of children: 2  . Years of education: Not on file  . Highest education level: Not on file  Occupational History    Comment: Medial lab technologist  Social Needs  . Financial resource strain: Not on file  . Food insecurity:    Worry: Not on file    Inability: Not on file  . Transportation needs:    Medical: Not on file    Non-medical: Not on file  Tobacco Use  . Smoking status: Never Smoker  . Smokeless tobacco: Never Used  Substance and Sexual  Activity  . Alcohol use: No  . Drug use: No  . Sexual activity: Yes    Partners: Male  Lifestyle  . Physical activity:    Days per week: Not on file    Minutes per session: Not on file  . Stress: Not on file  Relationships  . Social connections:    Talks on phone: Not on file    Gets together: Not on file    Attends religious service: Not on file    Active member of club or organization: Not on file    Attends meetings of clubs or organizations: Not on file    Relationship status: Not on file  . Intimate partner violence:    Fear of current or ex partner: Not on file    Emotionally abused: Not on file    Physically abused: Not on file    Forced sexual activity: Not on file  Other Topics Concern  . Not on file  Social History Narrative   Exercise almost every day    Outpatient Medications Prior to Visit  Medication Sig Dispense Refill  . eletriptan (RELPAX) 40 MG tablet TAKE 1 TABLET BY MOUTH AS NEEDED FOR MIGRAINE OR HEADACHE,  MAY REPEAT IN 2 HOURS IF HEADACHE PERSISTS OR RECURS 6 tablet 2  . fluticasone (FLONASE) 50 MCG/ACT nasal spray Place 2 sprays into both nostrils daily. 16 g 6  . propranolol (INDERAL) 20 MG tablet TAKE 1 TABLET (20 MG TOTAL) BY MOUTH 3 TIMES DAILY. 90 tablet 5  . SUMAtriptan (IMITREX) 100 MG tablet SEE NOTES 12 tablet 0  . tiZANidine (ZANAFLEX) 4 MG tablet Take 4 mg by mouth as needed.     . topiramate (TOPAMAX) 50 MG tablet Take 2 tablets (100 mg total) by mouth at bedtime. 60 tablet 2  . amphetamine-dextroamphetamine (ADDERALL) 10 MG tablet Take 1 tablet (10 mg total) 2 (two) times daily by mouth. 60 tablet 0  . amphetamine-dextroamphetamine (ADDERALL) 10 MG tablet Take 1 tablet (10 mg total) 2 (two) times daily by mouth. 60 tablet 0  . amphetamine-dextroamphetamine (ADDERALL) 10 MG tablet Take 1 tablet (10 mg total) by mouth 2 (two) times daily. 60 tablet 0  . calcitonin, salmon, (MIACALCIN/FORTICAL) 200 UNIT/ACT nasal spray U 1 SPR IN ROTATING  NOSTRILS UTD  0  . NUCYNTA 50 MG tablet TK 1 T PO Q 6 H PRN  0   No facility-administered medications prior to visit.     Allergies  Allergen Reactions  . Forteo [Teriparatide (Recombinant)] Other (See Comments)    Bone pain and headaches  . Fosamax [Alendronate Sodium]   . Vicodin [Hydrocodone-Acetaminophen] Nausea And Vomiting  . Codeine Nausea And Vomiting    Review of Systems  Constitutional: Negative for fever and malaise/fatigue.  HENT: Negative for congestion.   Eyes: Negative for blurred vision.  Respiratory: Negative for cough and shortness of breath.   Cardiovascular: Negative for chest pain, palpitations and leg swelling.  Gastrointestinal: Negative for vomiting.  Musculoskeletal: Negative for back pain.  Skin: Negative for rash.  Neurological: Negative for loss of consciousness and headaches.       Objective:    Physical Exam  Constitutional: She is oriented to person, place, and time. She appears well-developed and well-nourished.  HENT:  Head: Normocephalic and atraumatic.  Eyes: Conjunctivae and EOM are normal.  Neck: Normal range of motion. Neck supple. No JVD present. Carotid bruit is not present. No thyromegaly present.  Cardiovascular: Normal rate, regular rhythm and normal heart sounds.  No murmur heard. Pulmonary/Chest: Effort normal and breath sounds normal. No respiratory distress. She has no wheezes. She has no rales. She exhibits no tenderness.  Musculoskeletal: She exhibits no edema.  Neurological: She is alert and oriented to person, place, and time.  Psychiatric: She has a normal mood and affect. Her behavior is normal. Judgment and thought content normal.  Nursing note and vitals reviewed.   BP 116/80 (BP Location: Right Arm, Cuff Size: Normal)   Pulse 84   Temp (!) 97.4 F (36.3 C) (Oral)   Resp 16   Ht 5\' 10"  (1.778 m)   Wt 126 lb 6.4 oz (57.3 kg)   SpO2 98%   BMI 18.14 kg/m  Wt Readings from Last 3 Encounters:  10/14/17 126 lb  6.4 oz (57.3 kg)  09/12/17 125 lb (56.7 kg)  01/11/17 122 lb 6 oz (55.5 kg)   BP Readings from Last 3 Encounters:  10/14/17 116/80  09/12/17 104/82  01/11/17 122/78     Immunization History  Administered Date(s) Administered  . Influenza,inj,Quad PF,6+ Mos 04/23/2013  . Influenza-Unspecified 05/27/2014, 05/03/2015    Health Maintenance  Topic Date Due  . COLONOSCOPY  04/10/2015  . MAMMOGRAM  01/10/2018  .  PAP SMEAR  11/10/2018  . TETANUS/TDAP  05/08/2022  . INFLUENZA VACCINE  Completed  . Hepatitis C Screening  Completed  . HIV Screening  Addressed    Lab Results  Component Value Date   WBC 10.5 11/13/2016   HGB 13.7 11/13/2016   HCT 41.0 11/13/2016   PLT 278.0 11/13/2016   GLUCOSE 97 02/07/2017   CHOL 117 11/13/2016   TRIG 66.0 11/13/2016   HDL 57.50 11/13/2016   LDLCALC 47 11/13/2016   ALT 24 02/07/2017   AST 22 02/07/2017   NA 141 02/07/2017   K 3.6 02/07/2017   CL 105 02/07/2017   CREATININE 0.85 02/07/2017   BUN 14 02/07/2017   CO2 30 02/07/2017   TSH 0.41 11/13/2016   HGBA1C 5.8 02/07/2017   MICROALBUR <0.7 11/13/2016    Lab Results  Component Value Date   TSH 0.41 11/13/2016   Lab Results  Component Value Date   WBC 10.5 11/13/2016   HGB 13.7 11/13/2016   HCT 41.0 11/13/2016   MCV 95.1 11/13/2016   PLT 278.0 11/13/2016   Lab Results  Component Value Date   NA 141 02/07/2017   K 3.6 02/07/2017   CO2 30 02/07/2017   GLUCOSE 97 02/07/2017   BUN 14 02/07/2017   CREATININE 0.85 02/07/2017   BILITOT 0.5 02/07/2017   ALKPHOS 53 02/07/2017   AST 22 02/07/2017   ALT 24 02/07/2017   PROT 7.1 02/07/2017   ALBUMIN 4.5 02/07/2017   CALCIUM 9.9 02/07/2017   GFR 72.53 02/07/2017   Lab Results  Component Value Date   CHOL 117 11/13/2016   Lab Results  Component Value Date   HDL 57.50 11/13/2016   Lab Results  Component Value Date   LDLCALC 47 11/13/2016   Lab Results  Component Value Date   TRIG 66.0 11/13/2016   Lab Results    Component Value Date   CHOLHDL 2 11/13/2016   Lab Results  Component Value Date   HGBA1C 5.8 02/07/2017         Assessment & Plan:  Controlled substance policy reviewed.  Problem List Items Addressed This Visit    None    Visit Diagnoses    Attention deficit disorder (ADD) without hyperactivity       Relevant Medications   amphetamine-dextroamphetamine (ADDERALL) 10 MG tablet   amphetamine-dextroamphetamine (ADDERALL) 10 MG tablet   amphetamine-dextroamphetamine (ADDERALL) 10 MG tablet    stable -- con't meds rto 6 months or sooner prn  I have discontinued Alyiah H. Giannetti's NUCYNTA and calcitonin (salmon). I have also changed her amphetamine-dextroamphetamine and amphetamine-dextroamphetamine. Additionally, I am having her maintain her tiZANidine, fluticasone, eletriptan, propranolol, topiramate, SUMAtriptan, and amphetamine-dextroamphetamine.  Meds ordered this encounter  Medications  . amphetamine-dextroamphetamine (ADDERALL) 10 MG tablet    Sig: Take 1 tablet (10 mg total) by mouth 2 (two) times daily.    Dispense:  60 tablet    Refill:  0    For March 2019  . amphetamine-dextroamphetamine (ADDERALL) 10 MG tablet    Sig: Take 1 tablet (10 mg total) by mouth 2 (two) times daily.    Dispense:  60 tablet    Refill:  0    For April 2019  . amphetamine-dextroamphetamine (ADDERALL) 10 MG tablet    Sig: Take 1 tablet (10 mg total) by mouth 2 (two) times daily.    Dispense:  60 tablet    Refill:  0    For May 2019    CMA served as Education administrator during  this visit. History, Physical and Plan performed by medical provider. Documentation and orders reviewed and attested to.  [name], DO

## 2017-10-18 MED FILL — PROPRANOLOL 20 MG TABLET: 20 | 30 days supply | Qty: 90 | Fill #1

## 2017-10-28 ENCOUNTER — Other Ambulatory Visit: Payer: Self-pay | Admitting: Neurology

## 2017-11-14 ENCOUNTER — Encounter: Payer: Self-pay | Admitting: Neurology

## 2017-11-15 ENCOUNTER — Encounter: Payer: Self-pay | Admitting: Neurology

## 2017-12-03 MED FILL — PROPRANOLOL 20 MG TABLET: 20 | 30 days supply | Qty: 90 | Fill #2

## 2017-12-09 ENCOUNTER — Encounter: Payer: Self-pay | Admitting: Neurology

## 2017-12-12 ENCOUNTER — Other Ambulatory Visit: Payer: Self-pay

## 2017-12-12 MED ORDER — DIHYDROERGOTAMINE MESYLATE 4 MG/ML NA SOLN: 1.0000 | NASAL | 12 refills | Status: DC | PRN

## 2017-12-24 ENCOUNTER — Encounter: Payer: Self-pay | Admitting: Neurology

## 2017-12-24 ENCOUNTER — Ambulatory Visit (INDEPENDENT_AMBULATORY_CARE_PROVIDER_SITE_OTHER): Payer: BLUE CROSS/BLUE SHIELD | Admitting: Neurology

## 2017-12-24 VITALS — BP 120/82 | HR 74 | Ht 69.5 in | Wt 123.0 lb

## 2017-12-24 DIAGNOSIS — G43709 Chronic migraine without aura, not intractable, without status migrainosus: Secondary | ICD-10-CM | POA: Diagnosis not present

## 2017-12-24 DIAGNOSIS — M542 Cervicalgia: Secondary | ICD-10-CM | POA: Diagnosis not present

## 2017-12-24 MED ORDER — DIHYDROERGOTAMINE MESYLATE 4 MG/ML NA SOLN: 1.0000 | NASAL | 12 refills | Status: DC | PRN

## 2017-12-24 NOTE — Progress Notes (Signed)
NEUROLOGY FOLLOW UP OFFICE NOTE  Tracy Cook [id number]  HISTORY OF PRESENT ILLNESS: Tracy Cook is a 62 year old right-handed female with POTS who follows up for migraine.   UPDATE: In February, she was tapered off of topiramate, however headaches increased, so she was titrated up to 100mg  at bedtime.  Migraines have become frequent.  They have slightly changed.  They are now mostly retro-orbital (either side), severe and pressure.  She often wakes up with them.  She takes sumatriptan, which will knock it down to mild after 2 hours, but she will still have lingering pain for rest of day.  Sometimes, it will return the next day.  She is having about 15 headache days a month.  She would rather not increase the topiramate due to cognitive changes and concern about weight loss.  She reports continued neck pain. Current NSAIDS:  none Current analgesics:  none Current triptans:  sumatriptan 50mg  to 100mg  Current anti-nausea:  none Current muscle relaxants:  Tizanidine 4mg  (for neck pain) Antihypertensive medications:  propranolol 20mg  three times daily Antidepressant medications:  none Anticonvulsant medications:  topiramate 100mg   Vitamins/Herbal/Supplements:  Magnesium 400mg  Antihistamines/Decongestants:  None Other therapy:  Massage of neck   Caffeine:  daily Alcohol:  no Smoker:  no Diet:  Diet with little or no diary and wheat. Exercise:  routine Depression:  no; Anxiety:  yes Sleep hygiene:  Varies   HISTORY: Onset:  "almost all of my life" Location:  Bi-frontal/temporal, right retro-orbital Quality:  pounding.  Sometimes will wake her up at night. Initial Intensity:  7/10 Aura:  no Prodrome:  no Associated symptoms:  Nausea, photophobia, phonophobia, osmophobia, rarely vomiting.  She denies autonomic symptoms or unilateral numbness or weakness. Initial Duration:  Several hours (1 hour with sumatriptan) Initial Frequency:  3 to 4 days per  week Triggers/exacerbating factors:  Light, anxiety, narcotics, fan blowing in face, wine, narcotics Relieving factors:  sumatriptan Activity:  Usually can function   Past NSAIDS:  Ibuprofen, naproxen Past analgesics:  Tylenol, Excedrin Past abortive triptans:  Maxalt, Relpax Past muscle relaxants:  Skelaxin Past anti-nausea:  none Past antihypertensive medications:  none Past antidepressant medications:  none Past anticonvulsant medications:  none Past vitamins/Herbal/Supplements:  none Past antihistamines/decongestants:  Benadryl  PAST MEDICAL HISTORY: Past Medical History:  Diagnosis Date  . Cardiomyopathy ([place])   . Insomnia   . Menopause   . Migraine   . Orthostatic hypotension   . Osteoporosis   . Other malaise and fatigue   . Persistent disorder of initiating or maintaining sleep   . POTS (postural orthostatic tachycardia syndrome)   . Tachycardia, unspecified     MEDICATIONS: Current Outpatient Medications on File Prior to Visit  Medication Sig Dispense Refill  . amphetamine-dextroamphetamine (ADDERALL) 10 MG tablet Take 1 tablet (10 mg total) by mouth 2 (two) times daily. 60 tablet 0  . amphetamine-dextroamphetamine (ADDERALL) 10 MG tablet Take 1 tablet (10 mg total) by mouth 2 (two) times daily. 60 tablet 0  . amphetamine-dextroamphetamine (ADDERALL) 10 MG tablet Take 1 tablet (10 mg total) by mouth 2 (two) times daily. 60 tablet 0  . dihydroergotamine (MIGRANAL) 4 MG/ML nasal spray Place 1 spray into the nose as needed for migraine. Use in one nostril as directed.  No more than 4 sprays in one hour 8 mL 12  . eletriptan (RELPAX) 40 MG tablet TAKE 1 TABLET BY MOUTH AS NEEDED FOR MIGRAINE OR HEADACHE, MAY REPEAT IN 2 HOURS IF HEADACHE PERSISTS OR RECURS (  Patient not taking: Reported on 12/24/2017) 6 tablet 2  . fluticasone (FLONASE) 50 MCG/ACT nasal spray Place 2 sprays into both nostrils daily. (Patient not taking: Reported on 12/24/2017) 16 g 6  . propranolol (INDERAL)  20 MG tablet TAKE 1 TABLET (20 MG TOTAL) BY MOUTH 3 TIMES DAILY. (Patient taking differently: TAKE 1 TABLET (20 MG TOTAL) BY MOUTH 2 TIMES DAILY.) 90 tablet 5  . SUMAtriptan (IMITREX) 100 MG tablet SEE NOTES 12 tablet 1  . tiZANidine (ZANAFLEX) 4 MG tablet Take 4 mg by mouth as needed.     . topiramate (TOPAMAX) 50 MG tablet Take 2 tablets (100 mg total) by mouth at bedtime. 60 tablet 2   No current facility-administered medications on file prior to visit.     ALLERGIES: Allergies  Allergen Reactions  . Forteo [Teriparatide (Recombinant)] Other (See Comments)    Bone pain and headaches  . Fosamax [Alendronate Sodium]   . Vicodin [Hydrocodone-Acetaminophen] Nausea And Vomiting  . Codeine Nausea And Vomiting    FAMILY HISTORY: Family History  Problem Relation Age of Onset  . Coronary artery disease Father        Died of MI at age 64  . Asthma Father   . Hypertension Father   . Sudden death Father   . Heart attack Father   . Stroke Mother   . Diabetes Neg Hx   . Hyperlipidemia Neg Hx     SOCIAL HISTORY: Social History   Socioeconomic History  . Marital status: Married    Spouse name: Not on file  . Number of children: 2  . Years of education: Not on file  . Highest education level: Not on file  Occupational History    Comment: Medial lab technologist  Social Needs  . Financial resource strain: Not on file  . Food insecurity:    Worry: Not on file    Inability: Not on file  . Transportation needs:    Medical: Not on file    Non-medical: Not on file  Tobacco Use  . Smoking status: Never Smoker  . Smokeless tobacco: Never Used  Substance and Sexual Activity  . Alcohol use: No  . Drug use: No  . Sexual activity: Yes    Partners: Male  Lifestyle  . Physical activity:    Days per week: Not on file    Minutes per session: Not on file  . Stress: Not on file  Relationships  . Social connections:    Talks on phone: Not on file    Gets together: Not on file     Attends religious service: Not on file    Active member of club or organization: Not on file    Attends meetings of clubs or organizations: Not on file    Relationship status: Not on file  . Intimate partner violence:    Fear of current or ex partner: Not on file    Emotionally abused: Not on file    Physically abused: Not on file    Forced sexual activity: Not on file  Other Topics Concern  . Not on file  Social History Narrative   Exercise almost every day    REVIEW OF SYSTEMS: Constitutional: No fevers, chills, or sweats, no generalized fatigue, change in appetite Eyes: No visual changes, double vision, eye pain Ear, nose and throat: No hearing loss, ear pain, nasal congestion, sore throat Cardiovascular: No chest pain, palpitations Respiratory:  No shortness of breath at rest or with exertion, wheezes GastrointestinaI: No  nausea, vomiting, diarrhea, abdominal pain, fecal incontinence Genitourinary:  No dysuria, urinary retention or frequency Musculoskeletal:  Neck pain Integumentary: No rash, pruritus, skin lesions Neurological: as above Psychiatric: No depression, insomnia, anxiety Endocrine: No palpitations, fatigue, diaphoresis, mood swings, change in appetite, change in weight, increased thirst Hematologic/Lymphatic:  No purpura, petechiae. Allergic/Immunologic: no itchy/runny eyes, nasal congestion, recent allergic reactions, rashes  PHYSICAL EXAM: Vitals:   12/24/17 0803  BP: 120/82  Pulse: 74  SpO2: 98%   General: No acute distress.  Patient appears well-groomed.  normal body habitus. Head:  Normocephalic/atraumatic Eyes:  Fundi examined but not visualized Neck: supple, no paraspinal tenderness, full range of motion Heart:  Regular rate and rhythm Lungs:  Clear to auscultation bilaterally Back: No paraspinal tenderness Neurological Exam: alert and oriented to person, place, and time. Attention span and concentration intact, recent and remote memory intact, fund  of knowledge intact.  Speech fluent and not dysarthric, language intact.  CN II-XII intact. Bulk and tone normal, muscle strength 5/5 throughout.  Sensation to light touch, temperature and vibration intact.  Deep tendon reflexes 2+ throughout, toes downgoing.  Finger to nose and heel to shin testing intact.  Gait normal  IMPRESSION: Chronic migraine without aura Cervicalgia  PLAN: 1.  Physical therapy for the neck 2. Will continue topiramate 100mg  at bedtime 3.  Instead of sumatriptan, will try Migranal 4.  Limit use of pain relievers to no more than 2 days out of week to prevent rebound headache 5.  Magnesium 400mg , riboflavin 400mg , CoQ10 100mg  three times daily 6.  Continue headache diary 7.  Follow up in 3 months.  Metta Clines, DO  CC:  Dr. Carollee Herter

## 2017-12-24 NOTE — Patient Instructions (Addendum)
1.  We will send you to physical therapy to help with neck pain 2.  Continue topiramate 150mg  at bedtime and tizanidine at bedtime 3.  Instead of sumatriptan, try Migranal spray at earliest onset of migraine.  1 spray in each nostril and may repeat in 15 minutes if needed.  Do not repeat more than once in 24 hours 4.  Limit use of pain relievers to no more than 2 days out of week to prevent rebound headache 5.  Take magnesium 400mg , riboflavin 400mg  and CoQ10 100mg  three times daily 6.  Keep headache diary 7.  Follow up in 3 months.

## 2017-12-29 ENCOUNTER — Other Ambulatory Visit: Payer: Self-pay | Admitting: Neurology

## 2017-12-30 ENCOUNTER — Other Ambulatory Visit: Payer: Self-pay | Admitting: *Deleted

## 2017-12-30 MED ORDER — TOPIRAMATE 50 MG PO TABS: 100.0000 mg | ORAL_TABLET | Freq: Every day | ORAL | 5 refills | Status: DC

## 2018-01-06 ENCOUNTER — Encounter: Payer: Self-pay | Admitting: Neurology

## 2018-01-06 MED ORDER — SUMATRIPTAN SUCCINATE 100 MG PO TABS: ORAL_TABLET | ORAL | 1 refills | Status: DC

## 2018-01-06 NOTE — Addendum Note (Signed)
Addended byAnnamaria Helling on: 01/06/2018 03:07 PM   Modules accepted: Orders

## 2018-01-09 DIAGNOSIS — M5136 Other intervertebral disc degeneration, lumbar region: Secondary | ICD-10-CM | POA: Diagnosis not present

## 2018-01-14 ENCOUNTER — Other Ambulatory Visit: Payer: Self-pay | Admitting: Family Medicine

## 2018-01-14 DIAGNOSIS — F988 Other specified behavioral and emotional disorders with onset usually occurring in childhood and adolescence: Secondary | ICD-10-CM

## 2018-01-14 NOTE — Telephone Encounter (Signed)
Copied from [place] [id number]. Topic: Quick Communication - Rx Refill/Question >> Jan 14, 2018 12:08 PM Synthia Innocent wrote: Medication: amphetamine-dextroamphetamine (ADDERALL) 10 MG tablet  Has the patient contacted their pharmacy?Yes (Agent: If no, request that the patient contact the pharmacy for the refill.) (Agent: If yes, when and what did the pharmacy advise?)  Preferred Pharmacy (with phone number or street name): [facility] on [name]  Agent: Please be advised that RX refills may take up to 3 business days. We ask that you follow-up with your pharmacy.

## 2018-01-15 NOTE — Telephone Encounter (Signed)
Adderall refill Last Refill:12/14/17 #60 Last OV: 10/14/17 PCP: Dr. Carollee Herter Pharmacy: [facility] [name]

## 2018-01-16 ENCOUNTER — Other Ambulatory Visit: Payer: Self-pay

## 2018-01-16 ENCOUNTER — Ambulatory Visit: Payer: BLUE CROSS/BLUE SHIELD | Attending: Neurology | Admitting: Physical Therapy

## 2018-01-16 DIAGNOSIS — G4489 Other headache syndrome: Secondary | ICD-10-CM | POA: Insufficient documentation

## 2018-01-16 DIAGNOSIS — M62838 Other muscle spasm: Secondary | ICD-10-CM | POA: Insufficient documentation

## 2018-01-16 DIAGNOSIS — M542 Cervicalgia: Secondary | ICD-10-CM | POA: Insufficient documentation

## 2018-01-16 NOTE — Therapy (Signed)
[facility] [address]  [place], [place], [address] Phone: [phone]   Fax:  [phone]  Physical Therapy Evaluation  Patient Details  Name: Tracy Cook MRN: [id number] Date of Birth: [birth year] Referring Provider: Pleas Koch, DO   Encounter Date: 01/16/2018  PT End of Session - 01/16/18 1040    Visit Number  1    Number of Visits  13    Date for PT Re-Evaluation  02/27/18    PT Start Time  0936    PT Stop Time  1018    PT Time Calculation (min)  42 min    Activity Tolerance  Patient tolerated treatment well    Behavior During Therapy  [facility] for tasks assessed/performed       Past Medical History:  Diagnosis Date  . Cardiomyopathy ([place])   . Insomnia   . Menopause   . Migraine   . Orthostatic hypotension   . Osteoporosis   . Other malaise and fatigue   . Persistent disorder of initiating or maintaining sleep   . POTS (postural orthostatic tachycardia syndrome)   . Tachycardia, unspecified     No past surgical history on file.  There were no vitals filed for this visit.   Subjective Assessment - 01/16/18 0943    Subjective  Pt reports that she has "always" experienced migraines but they have gotten much worse in the last 2 months. She has been waking up in the mornings with ocular migraines recently, and will take Sumatriptan at the onset of symptoms for relief. Visited orthopedist one week ago and received a cortisone shot in the upper L cervical spine which she said helped for a few days. She is hesitant to get cortisone shots secondary to osteoporosis dx and is reluctant to increase dosage of her current medications/be prescribed any more medications. Has experienced "heaviness" and numb sensation down the back of the R hand. Pt reports that her headaches have been better over the last two days, but that she normally has one about everyday. In the near future she will have her adult child and  grandchildren , 30 years old and 62 years old, living with them and is nervous about how her neck and headaches will react to the lifestyle change. Pt states that when she has a headache she is unable to do anything.     Pertinent History  Osteoporosis; Hx of compression fractures    Limitations  House hold activities    Diagnostic tests  Last imaging was performed in 2013    Patient Stated Goals  To decrease headaches/severity of headaches    Currently in Pain?  No/denies    Pain Score  0-No pain 10/10 at worst - Less severe if she takes medication at onset    Pain Location  Head    Pain Orientation  Posterior    Pain Descriptors / Indicators  Sharp;Stabbing;Tightness    Pain Type  Chronic pain    Pain Radiating Towards  Front of head, eyes, occipital lobes; Intermittent "heaviness" and/or numbness in the back of the R hand    Pain Onset  More than a month ago    Pain Frequency  Constant    Aggravating Factors   Sleeping on her stomach     Pain Relieving Factors  Medication; sleeping with towel under the neck    Effect of Pain on Daily Activities  When she has a migraine she is unable to do anything  Multiple Pain Sites  No         [facility] PT Assessment - 01/16/18 0001      Assessment   Medical Diagnosis  Cervicalgia    Referring Provider  Pleas Koch, DO    Hand Dominance  Right    Next MD Visit  04/15/2018    Prior Therapy  Yes      Precautions   Precaution Comments  Osteoporosis with Hx of compression fractures      Restrictions   Other Position/Activity Restrictions  No overhead activities with resistance      Balance Screen   Has the patient fallen in the past 6 months  No    Has the patient had a decrease in activity level because of a fear of falling?   No    Is the patient reluctant to leave their home because of a fear of falling?   No      Home Film/video editor residence    Living Arrangements  Spouse/significant other;Other relatives     Type of [place] to enter    Entrance Stairs-Number of Steps  2    [place]  Two level      Prior Function   Level of Independence  Independent    Vocation  Part time employment    Arts administrator    Leisure  Trains at the gym with a personal trainer      Observation/Other Assessments   Focus on Therapeutic Outcomes (FOTO)   Neck - 52% (48% limitation); predicted 63% (37% limitation)      Posture/Postural Control   Posture/Postural Control  Postural limitations    Postural Limitations  Rounded Shoulders;Forward head      AROM   Overall AROM   Within functional limits for tasks performed for B shoulders    Right/Left Shoulder  Right;Left    Cervical Flexion  75% limited    Cervical Extension  75% limited    Cervical - Right Side Bend  50% limited    Cervical - Left Side Bend  50% limited    Cervical - Right Rotation  50% limited Hesistant to perform, fear of pain    Cervical - Left Rotation  50% limited Hesitant to perform, fear of pain      Strength   Right/Left Shoulder  Right;Left    Right Shoulder Flexion  4+/5    Left Shoulder Flexion  4+/5    Right/Left Forearm  Right;Left    Right/Left Wrist  Right;Left    Right Wrist Flexion  5/5    Right Wrist Extension  5/5    Left Wrist Flexion  5/5    Left Wrist Extension  5/5      Palpation   Palpation comment  TTP along transverse process musculature of the upper cervical spine and upper cervical spine posterior musculature; Increased muscle guarding along posterior cervical spine musculature                Objective measurements completed on examination: See above findings.      [facility] Adult PT Treatment/Exercise - 01/16/18 0001      Neck Exercises: Seated   Neck Retraction  10 reps;5 secs      Shoulder Exercises: Seated   Retraction  Both;10 reps             PT Education - 01/16/18 1040    Education  Details  Pt educated on results of  evaluation, POC, and HEP exercises    Person(s) Educated  Patient    Methods  Explanation;Demonstration;Tactile cues    Comprehension  Verbalized understanding;Returned demonstration       PT Short Term Goals - 01/16/18 1114      PT SHORT TERM GOAL #1   Title  Pt will be independent with initial HEP    Status  New    Target Date  01/30/18      PT SHORT TERM GOAL #2   Title  --        PT Long Term Goals - 01/16/18 1118      PT LONG TERM GOAL #1   Title  Pt will have < 25% restricted global cervical ROM without increased pain to improve ability to turn head when driving    Status  New    Target Date  03/06/18      PT LONG TERM GOAL #2   Title  Pt will report a reduction in number of headache days per month to < 20/30    Status  New    Target Date  03/06/18      PT LONG TERM GOAL #3   Title  Pt will describe and demonstrate correct posture in standing/sitting/sleeping to improve tolerance to activities in these positions    Status  New    Target Date  03/06/18             Plan - 01/16/18 1041    Clinical Impression Statement  Pt is a 62 y/o F who presents to OP PT with c/c of migraine headaches with ocular symptoms. Examination revealed decreased global cervical ROM, mild TTP along C2/C3  transverse spinal processes, positional abnormalities of the midcervical spine, and increased muscle guarding of posterior cervical musculature.  Pt prognosis is positively impacted by her motivation to reduce her symptoms and her already active lifestyle, but is negatively impacted by the chronicity of the condition and worsening symptoms.     History and Personal Factors relevant to plan of care:  Osteoporosis dx with hx of multiple compression fractures    Clinical Presentation  Evolving    Clinical Presentation due to:  Pt has osteoporosis dx with hx of multiple compression fractures and some neurologic involvement. The severity of her symptoms and frequency of symptoms has worsened  in the last two months.    Clinical Decision Making  Moderate    Rehab Potential  Fair    Clinical Impairments Affecting Rehab Potential  Chronicity of condition and sustained poor posture    PT Frequency  2x / week    PT Duration  6 weeks    PT Treatment/Interventions  ADLs/Self Care Home Management;Electrical Stimulation;Cryotherapy;Iontophoresis 4mg /ml Dexamethasone;Moist Heat;Traction;Ultrasound;Therapeutic activities;Therapeutic exercise;Neuromuscular re-education;Patient/family education;Passive range of motion;Dry needling;Taping;Vasopneumatic Device;Manual techniques    PT Next Visit Plan  Provide education about adequate posture; discuss sleeping posture    Consulted and Agree with Plan of Care  Patient       Patient will benefit from skilled therapeutic intervention in order to improve the following deficits and impairments:  Hypomobility, Increased muscle spasms, Decreased range of motion, Improper body mechanics, Decreased activity tolerance, Postural dysfunction, Pain, Impaired UE functional use  Visit Diagnosis: Cervicalgia  Other headache syndrome  Other muscle spasm     Problem List Patient Active Problem List   Diagnosis Date Noted  . Preventative health care 11/13/2016  . Chronic migraine without aura without status migrainosus, not  intractable 08/19/2015  . Osteoporosis 09/22/2014  . Compression fracture of L3 lumbar vertebra 09/22/2014  . Lumbar spondylosis 03/19/2013  . Conjunctivitis 06/30/2012  . Vaginal dryness, menopausal 06/30/2012  . Exposure to influenza 06/30/2012  . Toe pain 06/25/2012  . Colon polyps 04/17/2012  . Dermatitis 03/31/2012  . Postmenopausal atrophic vaginitis 03/31/2012  . Menopause 01/17/2012  . POTS (postural orthostatic tachycardia syndrome) 01/17/2012  . Congestive dilated cardiomyopathy ([place]) 11/07/2011  . Chest pain 11/07/2011  . UNSPECIFIED TACHYCARDIA 01/12/2009  . Migraine 12/15/2008  . INADEQUATE SLEEP HYGIENE  04/22/2008  . PERSISTENT DISORDER INITIATING/MAINTAINING SLEEP 03/11/2008  . Other malaise and fatigue 11/27/2007  . MVP (mitral valve prolapse) 10/30/2007  . POSTURAL HYPOTENSION 10/30/2007  . HYPERMOBILITY SYNDROME 10/30/2007    Shirline Frees, SPT 01/16/2018, 12:17 PM  [facility] [address]  [place], [place], [address] Phone: [phone]   Fax:  [phone]  Name: Tracy Cook MRN: [id number] Date of Birth: 1955/07/31

## 2018-01-17 ENCOUNTER — Encounter: Payer: Self-pay | Admitting: Family Medicine

## 2018-01-17 ENCOUNTER — Other Ambulatory Visit: Payer: Self-pay | Admitting: Family Medicine

## 2018-01-17 ENCOUNTER — Other Ambulatory Visit: Payer: Self-pay

## 2018-01-17 DIAGNOSIS — F988 Other specified behavioral and emotional disorders with onset usually occurring in childhood and adolescence: Secondary | ICD-10-CM

## 2018-01-17 MED ORDER — AMPHETAMINE-DEXTROAMPHETAMINE 10 MG PO TABS: 10.0000 mg | ORAL_TABLET | Freq: Two times a day (BID) | ORAL | 0 refills | Status: DC

## 2018-01-17 NOTE — Telephone Encounter (Signed)
Pt calling to check on refill. Pt is out.

## 2018-01-17 NOTE — Telephone Encounter (Signed)
Last adderall RX: 10/15/17 for March, April and May.  Last OV: 01/14/18 Next OV: 04/17/18 UDS: 09/18/17, low risk due 03/18/18 CSC: 09/18/17 CSR: No discrepancies identified

## 2018-01-17 NOTE — Telephone Encounter (Signed)
Patient request for refill on ADDERALL:   LOV  10/14/17   PCP:  Carollee Herter   Last refill:  09/2517

## 2018-01-17 NOTE — Telephone Encounter (Signed)
Copied from [place] [id number]. Topic: Quick Communication - Rx Refill/Question >> Jan 14, 2018 12:08 PM Synthia Innocent wrote: Medication: amphetamine-dextroamphetamine (ADDERALL) 10 MG tablet  Has the patient contacted their pharmacy?Yes (Agent: If no, request that the patient contact the pharmacy for the refill.) (Agent: If yes, when and what did the pharmacy advise?)  Preferred Pharmacy (with phone number or street name): [facility] on [name]  Agent: Please be advised that RX refills may take up to 3 business days. We ask that you follow-up with your pharmacy.

## 2018-01-20 ENCOUNTER — Encounter: Payer: Self-pay | Admitting: Physical Therapy

## 2018-01-20 ENCOUNTER — Ambulatory Visit: Payer: BLUE CROSS/BLUE SHIELD | Attending: Neurology | Admitting: Physical Therapy

## 2018-01-20 DIAGNOSIS — M62838 Other muscle spasm: Secondary | ICD-10-CM | POA: Diagnosis not present

## 2018-01-20 DIAGNOSIS — G4489 Other headache syndrome: Secondary | ICD-10-CM | POA: Insufficient documentation

## 2018-01-20 DIAGNOSIS — M542 Cervicalgia: Secondary | ICD-10-CM | POA: Diagnosis not present

## 2018-01-20 NOTE — Telephone Encounter (Signed)
Requesting:Adderall Contract:09/17/17 UDS:09/18/17 low risk Last Visit:10/14/17 Next Visit:04/17/18 Last Refill:01/17/18 request too early to fill.   Please Advise

## 2018-01-20 NOTE — Therapy (Signed)
[facility] [address]  [place], [place], [address] Phone: [phone]   Fax:  [phone]  Physical Therapy Treatment  Patient Details  Name: Tracy Cook MRN: [id number] Date of Birth: August 24, 1955 Referring Provider: Metta Clines, DO   Encounter Date: 01/20/2018  PT End of Session - 01/20/18 1557    Visit Number  2    Number of Visits  13    Date for PT Re-Evaluation  02/27/18    PT Start Time  0847    PT Stop Time  0928    PT Time Calculation (min)  41 min    Activity Tolerance  Patient tolerated treatment well    Behavior During Therapy  [facility] for tasks assessed/performed       Past Medical History:  Diagnosis Date  . Cardiomyopathy ([place])   . Insomnia   . Menopause   . Migraine   . Orthostatic hypotension   . Osteoporosis   . Other malaise and fatigue   . Persistent disorder of initiating or maintaining sleep   . POTS (postural orthostatic tachycardia syndrome)   . Tachycardia, unspecified     History reviewed. No pertinent surgical history.  There were no vitals filed for this visit.  Subjective Assessment - 01/20/18 0850    Subjective  Patient reports compliance with HEP and thinks they have helped. has had 2 days without HAs. Neck pain is about the same.     Pertinent History  Osteoporosis; Hx of compression fractures    Diagnostic tests  Last imaging was performed in 2013    Patient Stated Goals  To decrease headaches/severity of headaches    Currently in Pain?  Yes    Pain Score  4     Pain Location  Head    Pain Orientation  Posterior    Pain Descriptors / Indicators  Tightness                       [facility] Adult PT Treatment/Exercise - 01/20/18 0001      Neck Exercises: Machines for Strengthening   UBE (Upper Arm Bike)  L1 3/3      Neck Exercises: Seated   Neck Retraction  10 reps;Limitations    Neck Retraction Limitations  with pt OP    Other Seated Exercise   suboccipital stretch 2x30 sec    Other Seated Exercise  UT stretch 20sec each side with strap      Shoulder Exercises: Seated   Extension  Strengthening;Both;10 reps;Theraband    Theraband Level (Shoulder Extension)  Level 2 (Red)    Retraction  --    Theraband Level (Shoulder Retraction)  --    Row  Strengthening;Both;12 reps;Theraband    Theraband Level (Shoulder Row)  Level 2 (Red)      Shoulder Exercises: Standing   Extension  Strengthening;Both;10 reps;Theraband;Limitations    Theraband Level (Shoulder Extension)  Level 2 (Red)    Extension Limitations  cues for scap retraction    Row  Strengthening;Both;15 reps;Theraband;Limitations    Theraband Level (Shoulder Row)  Level 2 (Red)    Row Limitations  cues for scap retraction      Manual Therapy   Manual Therapy  Soft tissue mobilization;Myofascial release;Passive ROM    Manual therapy comments  --    Soft tissue mobilization  B suboccipitals, scalenes, SCM- TTP in B suboccpitals and L SCM    Myofascial Release  suboccipital release 3x30 sec  Passive ROM  suboccipital stretch (flexion and rotation) 30sec each side             PT Education - 01/20/18 1316    Education Details  Addition to HEP, given red TB    Person(s) Educated  Patient    Methods  Explanation;Demonstration;Tactile cues;Verbal cues;Handout    Comprehension  Verbalized understanding;Returned demonstration       PT Short Term Goals - 01/20/18 1605      PT SHORT TERM GOAL #1   Title  Pt will be independent with initial HEP    Status  Achieved        PT Long Term Goals - 01/16/18 1118      PT LONG TERM GOAL #1   Title  Pt will have < 25% restricted global cervical ROM without increased pain to improve ability to turn head when driving    Status  New    Target Date  03/06/18      PT LONG TERM GOAL #2   Title  Pt will report a reduction in number of headache days per month to < 20/30    Status  New    Target Date  03/06/18      PT LONG  TERM GOAL #3   Title  Pt will describe and demonstrate correct posture in standing/sitting/sleeping to improve tolerance to activities in these positions    Status  New    Target Date  03/06/18            Plan - 01/20/18 1558    Clinical Impression Statement  Patient arrived to session with report that she has been compliant with HEP and has not had a HA in 2 days. Tolerated STM to B suboccipitals, scalenes, SCM- TTP in B suboccpitals and L SCM. Patient also tolerated manual suboccipital release and suboccipital stretch with no complaints. Patient showed good form when performing cervical retractions this date- advised to add OP at end of range. Patient attempted cervical retraction with extension- mild c/o pain after several reps. Advised to perform suboccpital stretch to ease this pain. Added banded resistance to scapular retraction with row this session- added this exercise to HEP. Patient reported understanding.     Clinical Impairments Affecting Rehab Potential  Chronicity of condition and sustained poor posture    PT Treatment/Interventions  ADLs/Self Care Home Management;Electrical Stimulation;Cryotherapy;Iontophoresis 4mg /ml Dexamethasone;Moist Heat;Traction;Ultrasound;Therapeutic activities;Therapeutic exercise;Neuromuscular re-education;Patient/family education;Passive range of motion;Dry needling;Taping;Vasopneumatic Device;Manual techniques    Consulted and Agree with Plan of Care  Patient       Patient will benefit from skilled therapeutic intervention in order to improve the following deficits and impairments:  Hypomobility, Increased muscle spasms, Decreased range of motion, Improper body mechanics, Decreased activity tolerance, Postural dysfunction, Pain, Impaired UE functional use  Visit Diagnosis: Cervicalgia  Other headache syndrome  Other muscle spasm     Problem List Patient Active Problem List   Diagnosis Date Noted  . Preventative health care 11/13/2016  .  Chronic migraine without aura without status migrainosus, not intractable 08/19/2015  . Osteoporosis 09/22/2014  . Compression fracture of L3 lumbar vertebra 09/22/2014  . Lumbar spondylosis 03/19/2013  . Conjunctivitis 06/30/2012  . Vaginal dryness, menopausal 06/30/2012  . Exposure to influenza 06/30/2012  . Toe pain 06/25/2012  . Colon polyps 04/17/2012  . Dermatitis 03/31/2012  . Postmenopausal atrophic vaginitis 03/31/2012  . Menopause 01/17/2012  . POTS (postural orthostatic tachycardia syndrome) 01/17/2012  . Congestive dilated cardiomyopathy ([place]) 11/07/2011  . Chest pain 11/07/2011  .  UNSPECIFIED TACHYCARDIA 01/12/2009  . Migraine 12/15/2008  . INADEQUATE SLEEP HYGIENE 04/22/2008  . PERSISTENT DISORDER INITIATING/MAINTAINING SLEEP 03/11/2008  . Other malaise and fatigue 11/27/2007  . MVP (mitral valve prolapse) 10/30/2007  . POSTURAL HYPOTENSION 10/30/2007  . HYPERMOBILITY SYNDROME 10/30/2007    Janene Harvey, PT, DPT 01/20/18 4:07 PM   [facility] [address]  [place], [place], [address] Phone: [phone]   Fax:  [phone]  Name: AMIRACLE NEISES MRN: [id number] Date of Birth: 07-14-1956

## 2018-01-24 ENCOUNTER — Encounter: Payer: BLUE CROSS/BLUE SHIELD | Admitting: Physical Therapy

## 2018-01-28 ENCOUNTER — Telehealth: Payer: Self-pay | Admitting: Neurology

## 2018-01-28 ENCOUNTER — Ambulatory Visit: Payer: BLUE CROSS/BLUE SHIELD | Admitting: Physical Therapy

## 2018-01-28 ENCOUNTER — Encounter: Payer: Self-pay | Admitting: Physical Therapy

## 2018-01-28 DIAGNOSIS — G43709 Chronic migraine without aura, not intractable, without status migrainosus: Secondary | ICD-10-CM

## 2018-01-28 DIAGNOSIS — M542 Cervicalgia: Secondary | ICD-10-CM | POA: Diagnosis not present

## 2018-01-28 DIAGNOSIS — M62838 Other muscle spasm: Secondary | ICD-10-CM

## 2018-01-28 DIAGNOSIS — G4489 Other headache syndrome: Secondary | ICD-10-CM | POA: Diagnosis not present

## 2018-01-28 MED ORDER — PREDNISONE 10 MG (21) PO TBPK: ORAL_TABLET | ORAL | 0 refills | Status: DC

## 2018-01-28 NOTE — Telephone Encounter (Signed)
Dr. Jaffe patient 

## 2018-01-28 NOTE — Telephone Encounter (Signed)
Tracy Cook Self [phone]  Lillyian called to say that she is still having Chronic Headaches and that she needs something for the muscle pain in the back of her neck. She also stated that she was out of refills for the month of July for her SUMAtriptan (IMITREX) 100 MG tablet She would like for someone to call her back.  Last Office Visit 6.4.19 Next Office Visit 9.24.19

## 2018-01-28 NOTE — Telephone Encounter (Signed)
Called and spoke with Pt. I questioned her on the amount of sumatriptan she has taken. I sent in an Rx 6/17 with 1 refill. I advised her 9 tablets are to last 30 days. Pt states the Migranal did not work for her. Pt also states she decided to increase her topiramate, and has been taking 200mg  QHS for 2 nights and has not noticed any difference. I advised her the effects are not immediate, and we may need to reduce to 150mg .

## 2018-01-28 NOTE — Telephone Encounter (Signed)
OK for prednisone taper

## 2018-01-28 NOTE — Therapy (Signed)
[facility] [address]  [place], [place], [address] Phone: [phone]   Fax:  [phone]  Physical Therapy Treatment  Patient Details  Name: Tracy Cook MRN: [id number] Date of Birth: December 31, 1955 Referring Provider: Metta Clines, DO   Encounter Date: 01/28/2018  PT End of Session - 01/28/18 1134    Visit Number  3    Number of Visits  13    Date for PT Re-Evaluation  02/27/18    PT Start Time  1101    PT Stop Time  1142 moist heat    PT Time Calculation (min)  41 min    Activity Tolerance  Patient tolerated treatment well    Behavior During Therapy  [facility] for tasks assessed/performed       Past Medical History:  Diagnosis Date  . Cardiomyopathy ([place])   . Insomnia   . Menopause   . Migraine   . Orthostatic hypotension   . Osteoporosis   . Other malaise and fatigue   . Persistent disorder of initiating or maintaining sleep   . POTS (postural orthostatic tachycardia syndrome)   . Tachycardia, unspecified     History reviewed. No pertinent surgical history.  There were no vitals filed for this visit.  Subjective Assessment - 01/28/18 1103    Subjective  Patient reports she has had a rough week this week- has had chronic migraines. Had a migraine after last appointment and had another migraine after seeing hairdresser yesterday. Reports any touch on her head causes a migraine. Is out of migraine meds and wants to find MD that does trigger point injections.     Pertinent History  Osteoporosis; Hx of compression fractures    Diagnostic tests  Last imaging was performed in 2013    Patient Stated Goals  To decrease headaches/severity of headaches    Currently in Pain?  Yes    Pain Score  5     Pain Location  Head    Pain Orientation  Posterior    Pain Descriptors / Indicators  Tightness    Pain Type  Chronic pain    Multiple Pain Sites  Yes    Pain Score  5    Pain Location  Neck    Pain Orientation   Posterior    Pain Descriptors / Indicators  Tightness    Pain Type  Chronic pain                       [facility] Adult PT Treatment/Exercise - 01/28/18 0001      Neck Exercises: Machines for Strengthening   UBE (Upper Arm Bike)  L1.5 3/3      Neck Exercises: Seated   Neck Retraction  10 reps;Limitations 10x without TB; 10x with yellow TB    Neck Retraction Limitations  pt unable to tolerate OP; heavy VC/TCs to avoid posterior trunk lean    Other Seated Exercise  cervical extension SNAG w/ towel; 10x to tolerance    Other Seated Exercise  UT stretch, SCM stretch 20sec each on each side with strap      Shoulder Exercises: Seated   Retraction  Strengthening;Both;10 reps      Shoulder Exercises: Standing   Horizontal ABduction  Strengthening;Both;10 reps;Theraband;Limitations    Theraband Level (Shoulder Horizontal ABduction)  Level 1 (Yellow)    Horizontal ABduction Limitations  cues for speed and scap retraction    Extension  Strengthening;Both;Theraband;Limitations;15 reps    Theraband  Level (Shoulder Extension)  Level 2 (Red)    Extension Limitations  cues for scap retraction and speed    Row  Strengthening;Both;15 reps;Theraband    Theraband Level (Shoulder Row)  Level 2 (Red)    Row Limitations  cues for upright posture and scap retraction      Shoulder Exercises: Stretch   Cross Chest Stretch  1 rep;30 seconds;Limitations 90/90 doorway pec stretch      Modalities   Modalities  Moist Heat      Moist Heat Therapy   Number Minutes Moist Heat  10 Minutes    Moist Heat Location  Cervical               PT Short Term Goals - 01/20/18 1605      PT SHORT TERM GOAL #1   Title  Pt will be independent with initial HEP    Status  Achieved        PT Long Term Goals - 01/16/18 1118      PT LONG TERM GOAL #1   Title  Pt will have < 25% restricted global cervical ROM without increased pain to improve ability to turn head when driving    Status  New     Target Date  03/06/18      PT LONG TERM GOAL #2   Title  Pt will report a reduction in number of headache days per month to < 20/30    Status  New    Target Date  03/06/18      PT LONG TERM GOAL #3   Title  Pt will describe and demonstrate correct posture in standing/sitting/sleeping to improve tolerance to activities in these positions    Status  New    Target Date  03/06/18            Plan - 01/28/18 1147    Clinical Impression Statement  Patient arrived to session with report that she has had several migraines over this past week. Reports that STM at last session and going to the hairdresser yesterday brought on migraines. Assured patient we would hold off on STM this session. Patient able to perform cervical retractions this date without c/o pain- reports that OP caused discomfort when performing it at home and that she will not be doing it with OP from now on. Required heavy cueing to correct speed and posterior trunk lean with this exercise; improved after placing hand on back to avoid posterior lean. Patient tolerated UT and SCM stretching without pain. Also able to perform cervical extension SNAG with towel- patient with report of "crunching" in her neck but no pain. Required VC/TCs to correct speed and posture of periscapular strengthening exercises this date. Received moist heat to cervical spine at end of session for muscle relaxation- normal integumentary response and no c/o pain at end of session.    PT Treatment/Interventions  ADLs/Self Care Home Management;Electrical Stimulation;Cryotherapy;Iontophoresis 4mg /ml Dexamethasone;Moist Heat;Traction;Ultrasound;Therapeutic activities;Therapeutic exercise;Neuromuscular re-education;Patient/family education;Passive range of motion;Dry needling;Taping;Vasopneumatic Device;Manual techniques    Consulted and Agree with Plan of Care  Patient       Patient will benefit from skilled therapeutic intervention in order to improve the  following deficits and impairments:  Hypomobility, Increased muscle spasms, Decreased range of motion, Improper body mechanics, Decreased activity tolerance, Postural dysfunction, Pain, Impaired UE functional use  Visit Diagnosis: Cervicalgia  Other headache syndrome  Other muscle spasm     Problem List Patient Active Problem List   Diagnosis Date Noted  . Preventative  health care 11/13/2016  . Chronic migraine without aura without status migrainosus, not intractable 08/19/2015  . Osteoporosis 09/22/2014  . Compression fracture of L3 lumbar vertebra 09/22/2014  . Lumbar spondylosis 03/19/2013  . Conjunctivitis 06/30/2012  . Vaginal dryness, menopausal 06/30/2012  . Exposure to influenza 06/30/2012  . Toe pain 06/25/2012  . Colon polyps 04/17/2012  . Dermatitis 03/31/2012  . Postmenopausal atrophic vaginitis 03/31/2012  . Menopause 01/17/2012  . POTS (postural orthostatic tachycardia syndrome) 01/17/2012  . Congestive dilated cardiomyopathy ([place]) 11/07/2011  . Chest pain 11/07/2011  . UNSPECIFIED TACHYCARDIA 01/12/2009  . Migraine 12/15/2008  . INADEQUATE SLEEP HYGIENE 04/22/2008  . PERSISTENT DISORDER INITIATING/MAINTAINING SLEEP 03/11/2008  . Other malaise and fatigue 11/27/2007  . MVP (mitral valve prolapse) 10/30/2007  . POSTURAL HYPOTENSION 10/30/2007  . HYPERMOBILITY SYNDROME 10/30/2007    Janene Harvey, PT, DPT 01/28/18 11:49 AM   [facility] [address]  [place], [place], [address] Phone: [phone]   Fax:  [phone]  Name: Tracy Cook MRN: [id number] Date of Birth: 1956-03-19

## 2018-01-28 NOTE — Telephone Encounter (Signed)
Called and spoke with Pt. Advised her of prednisone taper and directions, 6,5,4,3,2,1  Pt would like something else called in in case she gets a migraine since the [place] did not work.

## 2018-01-28 NOTE — Telephone Encounter (Signed)
Patient husband now calling to check status. He states pt is in quit a bit of pain and if medication is approved it needs to be sent to [facility] in [place].

## 2018-01-28 NOTE — Telephone Encounter (Signed)
Options: 1.  Sumatriptan 6mg  injection.  May repeat once after 1 hour if needed, not to exceed 2 injections in 24 hrs  2. Zomig 5mg  NS.  1 spray in nostril.  May repeat once after 2 hours if needed, not to exceed 2 sprays in 24 hrs

## 2018-01-29 MED ORDER — ZOLMITRIPTAN 5 MG NA SOLN: 1.0000 | NASAL | 5 refills | Status: DC | PRN

## 2018-01-29 NOTE — Addendum Note (Signed)
Addended by: Clois Comber on: 01/29/2018 08:51 AM   Modules accepted: Orders

## 2018-01-29 NOTE — Telephone Encounter (Signed)
Called and spoke with Pt. Advised her of the options, she would like the Zomig n/s

## 2018-01-30 ENCOUNTER — Encounter: Payer: Self-pay | Admitting: Family Medicine

## 2018-01-30 ENCOUNTER — Ambulatory Visit: Payer: BLUE CROSS/BLUE SHIELD | Admitting: Physical Therapy

## 2018-01-30 ENCOUNTER — Ambulatory Visit (INDEPENDENT_AMBULATORY_CARE_PROVIDER_SITE_OTHER): Payer: BLUE CROSS/BLUE SHIELD | Admitting: Family Medicine

## 2018-01-30 ENCOUNTER — Encounter: Payer: Self-pay | Admitting: Physical Therapy

## 2018-01-30 ENCOUNTER — Encounter

## 2018-01-30 VITALS — BP 129/80 | HR 70 | Temp 97.8°F | Resp 16 | Ht 69.5 in | Wt 125.6 lb

## 2018-01-30 DIAGNOSIS — G43919 Migraine, unspecified, intractable, without status migrainosus: Secondary | ICD-10-CM

## 2018-01-30 DIAGNOSIS — M542 Cervicalgia: Secondary | ICD-10-CM | POA: Diagnosis not present

## 2018-01-30 DIAGNOSIS — M62838 Other muscle spasm: Secondary | ICD-10-CM | POA: Diagnosis not present

## 2018-01-30 DIAGNOSIS — G4489 Other headache syndrome: Secondary | ICD-10-CM | POA: Diagnosis not present

## 2018-01-30 MED FILL — PROPRANOLOL 20 MG TABLET: 20 | 30 days supply | Qty: 90 | Fill #3

## 2018-01-30 NOTE — Patient Instructions (Addendum)

## 2018-01-30 NOTE — Therapy (Addendum)
[facility] [address]  [place], [place], [address] Phone: [phone]   Fax:  [phone]  Physical Therapy Treatment  Patient Details  Name: Tracy Cook MRN: [id number] Date of Birth: [birth year] Referring Provider: Metta Clines, DO   Encounter Date: 01/30/2018  PT End of Session - 01/30/18 1143    Visit Number  4    Number of Visits  13    Date for PT Re-Evaluation  02/27/18    PT Start Time  0848    PT Stop Time  0932    PT Time Calculation (min)  44 min    Activity Tolerance  Patient tolerated treatment well    Behavior During Therapy  [facility] for tasks assessed/performed       Past Medical History:  Diagnosis Date  . Cardiomyopathy ([place])   . Insomnia   . Menopause   . Migraine   . Orthostatic hypotension   . Osteoporosis   . Other malaise and fatigue   . Persistent disorder of initiating or maintaining sleep   . POTS (postural orthostatic tachycardia syndrome)   . Tachycardia, unspecified     No past surgical history on file.  There were no vitals filed for this visit.  Subjective Assessment - 01/30/18 0852    Subjective  Pt reports that she talked wiht her neurologist about changing her medication since she feels that she is taking migraine medication everyday. Had a migraine yesterday and woke up this morning with a slight headache. Pt reports she is pretty discourage right now and feels that she is not doing the exercises at home correctly. Pt purchased a new pillow to sleep on at night from [facility]  and reports that she is sleeping on that about 4 hours/night and will be gradually increasing the amount of time she is sleeping on it.      Pertinent History  Osteoporosis; Hx of compression fractures    Limitations  House hold activities    Diagnostic tests  Last imaging was performed in 2013    Patient Stated Goals  To decrease headaches/severity of headaches    Currently in Pain?  No/denies                        [facility] Adult PT Treatment/Exercise - 01/30/18 0001      Self-Care   Self-Care  ADL's;Posture    ADL's  Discussed extensively with pt various ways to incorporate good posture into activities of daily living, including putting 2 y/o grandson into carseat and during gardening activities.     Posture  pt provided with posture and body mechanics educational handout. PT emphasized the importance of pt being aware of posture and making a consious effort to relax upper trap musculature. Pt contracts these muscles frequently when talking and needed cueing to relax muscle after conversation. Also discussed work set-up wiht pt. Pt mentioned that her monitor set up at work is not at an ideal height but with it being a shared computer she is unable to change the position of the monitor at this time.       Neck Exercises: Seated   Neck Retraction  10 reps;Limitations;3 secs    Neck Retraction Limitations  Pt unable to perform quality movement, PT placed miror in front of pt as external feedback mechanism to achieve motion. Pt became frustrated and exercise was transitioned to supine      Neck Exercises: Supine  Neck Retraction  10 reps;3 secs    Neck Retraction Limitations  Pt required VC and TC to achieve correct form. Pt noted that cue of "use the muscle on the front of your neck" worked best for her             PT Education - 01/30/18 0930    Education Details  Pt educated on posture and body mechanics hand out and purpose of neck retraction exercises for her condition/symptoms. Pt educated on her sitting posture, and timeline of therapeutic effect.     Person(s) Educated  Patient    Methods  Explanation;Demonstration;Verbal cues    Comprehension  Verbalized understanding;Returned demonstration;Verbal cues required;Tactile cues required       PT Short Term Goals - 01/20/18 1605      PT SHORT TERM GOAL #1   Title  Pt will be independent with initial HEP     Status  Achieved        PT Long Term Goals - 01/30/18 1154      PT LONG TERM GOAL #1   Title  Pt will have < 25% restricted global cervical ROM without increased pain to improve ability to turn head when driving    Status  On-going      PT LONG TERM GOAL #2   Title  Pt will report a reduction in number of headache days per month to < 20/30    Status  On-going      PT LONG TERM GOAL #3   Title  Pt will describe and demonstrate correct posture in standing/sitting/sleeping to improve tolerance to activities in these positions    Status  On-going            Plan - 01/30/18 1152    Clinical Impression Statement  Session today focused on education of condition, posture, and facilitating contraction of deep neck flexors. Pt expressed some frustration with her condition and with the exercises that she is to be doing at home. PT addressed pt concerns regarding therapy and pt seemed to be more optimistic towards end of session. Pt reluctant about some postural education citing that her work environment will not allow her to make some modifications, but that she will try other recommendations to the best of her ability. Stretching exercises also given to address increased tightness of upper trapezius and suboccipital musculature. Pt continues to demonstrate forward head posture and severe migraines, and will continue to benefit from physical therapy to address her mechanics and pain.     PT Treatment/Interventions  ADLs/Self Care Home Management;Electrical Stimulation;Cryotherapy;Iontophoresis 36m/ml Dexamethasone;Moist Heat;Traction;Ultrasound;Therapeutic activities;Therapeutic exercise;Neuromuscular re-education;Patient/family education;Passive range of motion;Dry needling;Taping;Vasopneumatic Device;Manual techniques    Consulted and Agree with Plan of Care  Patient       Patient will benefit from skilled therapeutic intervention in order to improve the following deficits and impairments:   Hypomobility, Increased muscle spasms, Decreased range of motion, Improper body mechanics, Decreased activity tolerance, Postural dysfunction, Pain, Impaired UE functional use  Visit Diagnosis: Cervicalgia  Other headache syndrome  Other muscle spasm     Problem List Patient Active Problem List   Diagnosis Date Noted  . Preventative health care 11/13/2016  . Chronic migraine without aura without status migrainosus, not intractable 08/19/2015  . Osteoporosis 09/22/2014  . Compression fracture of L3 lumbar vertebra 09/22/2014  . Lumbar spondylosis 03/19/2013  . Conjunctivitis 06/30/2012  . Vaginal dryness, menopausal 06/30/2012  . Exposure to influenza 06/30/2012  . Toe pain 06/25/2012  . Colon polyps 04/17/2012  .  Dermatitis 03/31/2012  . Postmenopausal atrophic vaginitis 03/31/2012  . Menopause 01/17/2012  . POTS (postural orthostatic tachycardia syndrome) 01/17/2012  . Congestive dilated cardiomyopathy ([place]) 11/07/2011  . Chest pain 11/07/2011  . UNSPECIFIED TACHYCARDIA 01/12/2009  . Migraine 12/15/2008  . INADEQUATE SLEEP HYGIENE 04/22/2008  . PERSISTENT DISORDER INITIATING/MAINTAINING SLEEP 03/11/2008  . Other malaise and fatigue 11/27/2007  . MVP (mitral valve prolapse) 10/30/2007  . POSTURAL HYPOTENSION 10/30/2007  . HYPERMOBILITY SYNDROME 10/30/2007    Shirline Frees, SPT 01/30/2018, 11:56 AM  [facility] [address]  [place], [place], [address] Phone: [phone]   Fax:  [phone]  Name: Tracy Cook MRN: [id number] Date of Birth: September 09, 1955   PHYSICAL THERAPY DISCHARGE SUMMARY  Visits from Start of Care: 4  Current functional level related to goals / functional outcomes: See clinical impression for status as of last visit on 01/30/18. Unable to fomally assess status as discharge as pt failed to return for remaining visits in POC.   Remaining deficits: As above.   Education /  Equipment: Pt provided with HEP exercises   Plan: Patient agrees to discharge.  Patient goals were not met. Patient is being discharged due to not returning since the last visit.  ?????         Shirline Frees, SPT 03/07/18, 10:42 AM

## 2018-01-30 NOTE — Patient Instructions (Signed)
Migraine Headache A migraine headache is an intense, throbbing pain on one side or both sides of the head. Migraines may also cause other symptoms, such as nausea, vomiting, and sensitivity to light and noise. What are the causes? Doing or taking certain things may also trigger migraines, such as:  Alcohol.  Smoking.  Medicines, such as: ? Medicine used to treat chest pain (nitroglycerine). ? Birth control pills. ? Estrogen pills. ? Certain blood pressure medicines.  Aged cheeses, chocolate, or caffeine.  Foods or drinks that contain nitrates, glutamate, aspartame, or tyramine.  Physical activity.  Other things that may trigger a migraine include:  Menstruation.  Pregnancy.  Hunger.  Stress, lack of sleep, too much sleep, or fatigue.  Weather changes.  What increases the risk? The following factors may make you more likely to experience migraine headaches:  Age. Risk increases with age.  Family history of migraine headaches.  Being Caucasian.  Depression and anxiety.  Obesity.  Being a woman.  Having a hole in the heart (patent foramen ovale) or other heart problems.  What are the signs or symptoms? The main symptom of this condition is pulsating or throbbing pain. Pain may:  Happen in any area of the head, such as on one side or both sides.  Interfere with daily activities.  Get worse with physical activity.  Get worse with exposure to bright lights or loud noises.  Other symptoms may include:  Nausea.  Vomiting.  Dizziness.  General sensitivity to bright lights, loud noises, or smells.  Before you get a migraine, you may get warning signs that a migraine is developing (aura). An aura may include:  Seeing flashing lights or having blind spots.  Seeing bright spots, halos, or zigzag lines.  Having tunnel vision or blurred vision.  Having numbness or a tingling feeling.  Having trouble talking.  Having muscle weakness.  How is this  diagnosed? A migraine headache can be diagnosed based on:  Your symptoms.  A physical exam.  Tests, such as CT scan or MRI of the head. These imaging tests can help rule out other causes of headaches.  Taking fluid from the spine (lumbar puncture) and analyzing it (cerebrospinal fluid analysis, or CSF analysis).  How is this treated? A migraine headache is usually treated with medicines that:  Relieve pain.  Relieve nausea.  Prevent migraines from coming back.  Treatment may also include:  Acupuncture.  Lifestyle changes like avoiding foods that trigger migraines.  Follow these instructions at home: Medicines  Take over-the-counter and prescription medicines only as told by your health care provider.  Do not drive or use heavy machinery while taking prescription pain medicine.  To prevent or treat constipation while you are taking prescription pain medicine, your health care provider may recommend that you: ? Drink enough fluid to keep your urine clear or pale yellow. ? Take over-the-counter or prescription medicines. ? Eat foods that are high in fiber, such as fresh fruits and vegetables, whole grains, and beans. ? Limit foods that are high in fat and processed sugars, such as fried and sweet foods. Lifestyle  Avoid alcohol use.  Do not use any products that contain nicotine or tobacco, such as cigarettes and e-cigarettes. If you need help quitting, ask your health care provider.  Get at least 8 hours of sleep every night.  Limit your stress. General instructions   Keep a journal to find out what may trigger your migraine headaches. For example, write down: ? What you eat and   drink. ? How much sleep you get. ? Any change to your diet or medicines.  If you have a migraine: ? Avoid things that make your symptoms worse, such as bright lights. ? It may help to lie down in a dark, quiet room. ? Do not drive or use heavy machinery. ? Ask your health care provider  what activities are safe for you while you are experiencing symptoms.  Keep all follow-up visits as told by your health care provider. This is important. Contact a health care provider if:  You develop symptoms that are different or more severe than your usual migraine symptoms. Get help right away if:  Your migraine becomes severe.  You have a fever.  You have a stiff neck.  You have vision loss.  Your muscles feel weak or like you cannot control them.  You start to lose your balance often.  You develop trouble walking.  You faint. This information is not intended to replace advice given to you by your health care provider. Make sure you discuss any questions you have with your health care provider. Document Released: 07/09/2005 Document Revised: 01/27/2016 Document Reviewed: 12/26/2015 Elsevier Interactive Patient Education  2017 Elsevier Inc.   

## 2018-01-30 NOTE — Progress Notes (Signed)
Patient ID: Tracy Cook, female   DOB: November 05, 1955, 62 y.o.   MRN: [id number]    Subjective:  I acted as a Education administrator for Dr. Carollee Herter.  Guerry Bruin, [place]   Patient ID: Tracy Cook, female    DOB: 21-Dec-1955, 62 y.o.   MRN: [id number]  Chief Complaint  Patient presents with  . wants referral to neuro that does botox    HPI  Patient is in today, she would like a referral to neurologist that does injections for migraines (trigger and botox injections).  Pt has no problems with her current neuro --- her mother had severe migraines and had botox and trigger point injections and that really helped her and pt migraines are not being helped much with current tx.  She is requesting to be referred to [facility].  No other complaints.  She does not curently have a headache.     Patient Care Team: Carollee Herter, Alferd Apa, DO as PCP - General (Family Medicine) Braden, Danny Lawless, DO as Consulting Physician (Psychiatry) Suella Broad, MD as Consulting Physician (Physical Medicine and Rehabilitation) Pieter Partridge, DO as Consulting Physician (Neurology) Iona Beard, [place] as Referring Physician (Optometry)   Past Medical History:  Diagnosis Date  . Cardiomyopathy ([place])   . Insomnia   . Menopause   . Migraine   . Orthostatic hypotension   . Osteoporosis   . Other malaise and fatigue   . Persistent disorder of initiating or maintaining sleep   . POTS (postural orthostatic tachycardia syndrome)   . Tachycardia, unspecified     No past surgical history on file.  Family History  Problem Relation Age of Onset  . Coronary artery disease Father        Died of MI at age 35  . Asthma Father   . Hypertension Father   . Sudden death Father   . Heart attack Father   . Stroke Mother   . Diabetes Neg Hx   . Hyperlipidemia Neg Hx     Social History   Socioeconomic History  . Marital status: Married    Spouse name: Not on file  . Number of children: 2  . Years of education: Not on file  .  Highest education level: Not on file  Occupational History    Comment: Medial lab technologist  Social Needs  . Financial resource strain: Not on file  . Food insecurity:    Worry: Not on file    Inability: Not on file  . Transportation needs:    Medical: Not on file    Non-medical: Not on file  Tobacco Use  . Smoking status: Never Smoker  . Smokeless tobacco: Never Used  Substance and Sexual Activity  . Alcohol use: No  . Drug use: No  . Sexual activity: Yes    Partners: Male  Lifestyle  . Physical activity:    Days per week: Not on file    Minutes per session: Not on file  . Stress: Not on file  Relationships  . Social connections:    Talks on phone: Not on file    Gets together: Not on file    Attends religious service: Not on file    Active member of club or organization: Not on file    Attends meetings of clubs or organizations: Not on file    Relationship status: Not on file  . Intimate partner violence:    Fear of current or ex partner: Not on file    Emotionally  abused: Not on file    Physically abused: Not on file    Forced sexual activity: Not on file  Other Topics Concern  . Not on file  Social History Narrative   Exercise almost every day    Outpatient Medications Prior to Visit  Medication Sig Dispense Refill  . amphetamine-dextroamphetamine (ADDERALL) 10 MG tablet Take 1 tablet (10 mg total) by mouth 2 (two) times daily. 60 tablet 0  . amphetamine-dextroamphetamine (ADDERALL) 10 MG tablet Take 1 tablet (10 mg total) by mouth 2 (two) times daily. 60 tablet 0  . amphetamine-dextroamphetamine (ADDERALL) 10 MG tablet Take 1 tablet (10 mg total) by mouth 2 (two) times daily. 60 tablet 0  . eletriptan (RELPAX) 40 MG tablet TAKE 1 TABLET BY MOUTH AS NEEDED FOR MIGRAINE OR HEADACHE, MAY REPEAT IN 2 HOURS IF HEADACHE PERSISTS OR RECURS 6 tablet 2  . predniSONE (STERAPRED UNI-PAK 21 TAB) 10 MG (21) TBPK tablet As directed 21 tablet 0  . propranolol (INDERAL) 20  MG tablet TAKE 1 TABLET (20 MG TOTAL) BY MOUTH 3 TIMES DAILY. 90 tablet 5  . SUMAtriptan (IMITREX) 100 MG tablet SEE NOTES 12 tablet 1  . tiZANidine (ZANAFLEX) 4 MG tablet Take 4 mg by mouth as needed.     . topiramate (TOPAMAX) 50 MG tablet Take 2 tablets (100 mg total) by mouth at bedtime. 60 tablet 5  . zolmitriptan (ZOMIG) 5 MG nasal solution Place 1 spray into the nose as needed for migraine. May repeat in 2 hrs if needed. No more than 2/24 6 Units 5  . dihydroergotamine (MIGRANAL) 4 MG/ML nasal spray Place 1 spray into the nose as needed for migraine. Use in one nostril as directed.  No more than 4 sprays in one hour 8 mL 12  . dihydroergotamine (MIGRANAL) 4 MG/ML nasal spray Place 1 spray into the nose as needed for migraine. Use in one nostril as directed.  No more than 4 sprays in one hour 8 mL 12  . fluticasone (FLONASE) 50 MCG/ACT nasal spray Place 2 sprays into both nostrils daily. 16 g 6   No facility-administered medications prior to visit.     Allergies  Allergen Reactions  . Forteo [Teriparatide (Recombinant)] Other (See Comments)    Bone pain and headaches  . Fosamax [Alendronate Sodium]   . Vicodin [Hydrocodone-Acetaminophen] Nausea And Vomiting  . Codeine Nausea And Vomiting    Review of Systems  Constitutional: Negative for fever and malaise/fatigue.  HENT: Negative for congestion.   Eyes: Negative for blurred vision.  Respiratory: Negative for cough and shortness of breath.   Cardiovascular: Negative for chest pain, palpitations and leg swelling.  Gastrointestinal: Negative for vomiting.  Musculoskeletal: Negative for back pain.  Skin: Negative for rash.  Neurological: Positive for headaches. Negative for loss of consciousness.       Objective:    Physical Exam  Constitutional: She is oriented to person, place, and time. She appears well-developed and well-nourished.  HENT:  Head: Normocephalic and atraumatic.  Eyes: Conjunctivae and EOM are normal.    Neck: Normal range of motion. Neck supple. No JVD present. Carotid bruit is not present. No thyromegaly present.  Cardiovascular: Normal rate, regular rhythm and normal heart sounds.  No murmur heard. Pulmonary/Chest: Effort normal and breath sounds normal. No respiratory distress. She has no wheezes. She has no rales. She exhibits no tenderness.  Musculoskeletal: She exhibits no edema.  Neurological: She is alert and oriented to person, place, and time.  Psychiatric: She  has a normal mood and affect.  Nursing note and vitals reviewed.   BP 129/80 (BP Location: Right Arm, Cuff Size: Normal)   Pulse 70   Temp 97.8 F (36.6 C) (Oral)   Resp 16   Ht 5' 9.5" (1.765 m)   Wt 125 lb 9.6 oz (57 kg)   SpO2 100%   BMI 18.28 kg/m  Wt Readings from Last 3 Encounters:  01/30/18 125 lb 9.6 oz (57 kg)  12/24/17 123 lb (55.8 kg)  10/14/17 126 lb 6.4 oz (57.3 kg)   BP Readings from Last 3 Encounters:  01/30/18 129/80  12/24/17 120/82  10/14/17 116/80     Immunization History  Administered Date(s) Administered  . Influenza,inj,Quad PF,6+ Mos 04/23/2013  . Influenza-Unspecified 05/27/2014, 05/03/2015    Health Maintenance  Topic Date Due  . COLONOSCOPY  04/10/2015  . MAMMOGRAM  01/10/2018  . INFLUENZA VACCINE  02/20/2018  . PAP SMEAR  11/10/2018  . TETANUS/TDAP  05/08/2022  . Hepatitis C Screening  Completed  . HIV Screening  Addressed    Lab Results  Component Value Date   WBC 10.5 11/13/2016   HGB 13.7 11/13/2016   HCT 41.0 11/13/2016   PLT 278.0 11/13/2016   GLUCOSE 97 02/07/2017   CHOL 117 11/13/2016   TRIG 66.0 11/13/2016   HDL 57.50 11/13/2016   LDLCALC 47 11/13/2016   ALT 24 02/07/2017   AST 22 02/07/2017   NA 141 02/07/2017   K 3.6 02/07/2017   CL 105 02/07/2017   CREATININE 0.85 02/07/2017   BUN 14 02/07/2017   CO2 30 02/07/2017   TSH 0.41 11/13/2016   HGBA1C 5.8 02/07/2017   MICROALBUR <0.7 11/13/2016    Lab Results  Component Value Date   TSH 0.41  11/13/2016   Lab Results  Component Value Date   WBC 10.5 11/13/2016   HGB 13.7 11/13/2016   HCT 41.0 11/13/2016   MCV 95.1 11/13/2016   PLT 278.0 11/13/2016   Lab Results  Component Value Date   NA 141 02/07/2017   K 3.6 02/07/2017   CO2 30 02/07/2017   GLUCOSE 97 02/07/2017   BUN 14 02/07/2017   CREATININE 0.85 02/07/2017   BILITOT 0.5 02/07/2017   ALKPHOS 53 02/07/2017   AST 22 02/07/2017   ALT 24 02/07/2017   PROT 7.1 02/07/2017   ALBUMIN 4.5 02/07/2017   CALCIUM 9.9 02/07/2017   GFR 72.53 02/07/2017   Lab Results  Component Value Date   CHOL 117 11/13/2016   Lab Results  Component Value Date   HDL 57.50 11/13/2016   Lab Results  Component Value Date   LDLCALC 47 11/13/2016   Lab Results  Component Value Date   TRIG 66.0 11/13/2016   Lab Results  Component Value Date   CHOLHDL 2 11/13/2016   Lab Results  Component Value Date   HGBA1C 5.8 02/07/2017         Assessment & Plan:   Problem List Items Addressed This Visit      Unprioritized   Migraine - Primary   Relevant Orders   Ambulatory referral to Neurology      I have discontinued Sarann H. Carachure's fluticasone, dihydroergotamine, and dihydroergotamine. I am also having her maintain her tiZANidine, eletriptan, propranolol, topiramate, SUMAtriptan, amphetamine-dextroamphetamine, amphetamine-dextroamphetamine, amphetamine-dextroamphetamine, predniSONE, and zolmitriptan.  No orders of the defined types were placed in this encounter.   CMA served as Education administrator during this visit. History, Physical and Plan performed by medical provider. Documentation and orders reviewed and  attested to.  [name], DO

## 2018-02-06 DIAGNOSIS — G43709 Chronic migraine without aura, not intractable, without status migrainosus: Secondary | ICD-10-CM | POA: Diagnosis not present

## 2018-02-07 DIAGNOSIS — R51 Headache: Secondary | ICD-10-CM | POA: Diagnosis not present

## 2018-02-07 DIAGNOSIS — M542 Cervicalgia: Secondary | ICD-10-CM | POA: Diagnosis not present

## 2018-02-07 DIAGNOSIS — M791 Myalgia, unspecified site: Secondary | ICD-10-CM | POA: Diagnosis not present

## 2018-02-07 DIAGNOSIS — M5481 Occipital neuralgia: Secondary | ICD-10-CM | POA: Diagnosis not present

## 2018-03-04 DIAGNOSIS — G43719 Chronic migraine without aura, intractable, without status migrainosus: Secondary | ICD-10-CM | POA: Diagnosis not present

## 2018-03-06 DIAGNOSIS — G43709 Chronic migraine without aura, not intractable, without status migrainosus: Secondary | ICD-10-CM | POA: Diagnosis not present

## 2018-03-17 MED FILL — PROPRANOLOL 20 MG TABLET: 20 | 30 days supply | Qty: 90 | Fill #4

## 2018-03-21 DIAGNOSIS — M545 Low back pain: Secondary | ICD-10-CM | POA: Diagnosis not present

## 2018-03-21 DIAGNOSIS — G894 Chronic pain syndrome: Secondary | ICD-10-CM | POA: Diagnosis not present

## 2018-03-29 DIAGNOSIS — M5136 Other intervertebral disc degeneration, lumbar region: Secondary | ICD-10-CM | POA: Diagnosis not present

## 2018-04-04 DIAGNOSIS — M545 Low back pain: Secondary | ICD-10-CM | POA: Diagnosis not present

## 2018-04-10 DIAGNOSIS — M549 Dorsalgia, unspecified: Secondary | ICD-10-CM | POA: Diagnosis not present

## 2018-04-10 DIAGNOSIS — S32050A Wedge compression fracture of fifth lumbar vertebra, initial encounter for closed fracture: Secondary | ICD-10-CM | POA: Diagnosis not present

## 2018-04-10 DIAGNOSIS — S32040A Wedge compression fracture of fourth lumbar vertebra, initial encounter for closed fracture: Secondary | ICD-10-CM | POA: Diagnosis not present

## 2018-04-14 NOTE — Progress Notes (Deleted)
NEUROLOGY FOLLOW UP OFFICE NOTE  Tracy Cook [id number]  HISTORY OF PRESENT ILLNESS: ***.  Records and images were personally reviewed where available.  ***.  PAST MEDICAL HISTORY: Past Medical History:  Diagnosis Date  . Cardiomyopathy ([place])   . Insomnia   . Menopause   . Migraine   . Orthostatic hypotension   . Osteoporosis   . Other malaise and fatigue   . Persistent disorder of initiating or maintaining sleep   . POTS (postural orthostatic tachycardia syndrome)   . Tachycardia, unspecified     MEDICATIONS: Current Outpatient Medications on File Prior to Visit  Medication Sig Dispense Refill  . amphetamine-dextroamphetamine (ADDERALL) 10 MG tablet Take 1 tablet (10 mg total) by mouth 2 (two) times daily. 60 tablet 0  . amphetamine-dextroamphetamine (ADDERALL) 10 MG tablet Take 1 tablet (10 mg total) by mouth 2 (two) times daily. 60 tablet 0  . amphetamine-dextroamphetamine (ADDERALL) 10 MG tablet Take 1 tablet (10 mg total) by mouth 2 (two) times daily. 60 tablet 0  . eletriptan (RELPAX) 40 MG tablet TAKE 1 TABLET BY MOUTH AS NEEDED FOR MIGRAINE OR HEADACHE, MAY REPEAT IN 2 HOURS IF HEADACHE PERSISTS OR RECURS 6 tablet 2  . predniSONE (STERAPRED UNI-PAK 21 TAB) 10 MG (21) TBPK tablet As directed 21 tablet 0  . propranolol (INDERAL) 20 MG tablet TAKE 1 TABLET (20 MG TOTAL) BY MOUTH 3 TIMES DAILY. 90 tablet 5  . SUMAtriptan (IMITREX) 100 MG tablet SEE NOTES 12 tablet 1  . tiZANidine (ZANAFLEX) 4 MG tablet Take 4 mg by mouth as needed.     . topiramate (TOPAMAX) 50 MG tablet Take 2 tablets (100 mg total) by mouth at bedtime. 60 tablet 5  . zolmitriptan (ZOMIG) 5 MG nasal solution Place 1 spray into the nose as needed for migraine. May repeat in 2 hrs if needed. No more than 2/24 6 Units 5   No current facility-administered medications on file prior to visit.     ALLERGIES: Allergies  Allergen Reactions  . Forteo [Teriparatide (Recombinant)] Other (See Comments)   Bone pain and headaches  . Fosamax [Alendronate Sodium]   . Vicodin [Hydrocodone-Acetaminophen] Nausea And Vomiting  . Codeine Nausea And Vomiting    FAMILY HISTORY: Family History  Problem Relation Age of Onset  . Coronary artery disease Father        Died of MI at age 75  . Asthma Father   . Hypertension Father   . Sudden death Father   . Heart attack Father   . Stroke Mother   . Diabetes Neg Hx   . Hyperlipidemia Neg Hx    ***.  SOCIAL HISTORY: Social History   Socioeconomic History  . Marital status: Married    Spouse name: Not on file  . Number of children: 2  . Years of education: Not on file  . Highest education level: Not on file  Occupational History    Comment: Medial lab technologist  Social Needs  . Financial resource strain: Not on file  . Food insecurity:    Worry: Not on file    Inability: Not on file  . Transportation needs:    Medical: Not on file    Non-medical: Not on file  Tobacco Use  . Smoking status: Never Smoker  . Smokeless tobacco: Never Used  Substance and Sexual Activity  . Alcohol use: No  . Drug use: No  . Sexual activity: Yes    Partners: Male  Lifestyle  . Physical  activity:    Days per week: Not on file    Minutes per session: Not on file  . Stress: Not on file  Relationships  . Social connections:    Talks on phone: Not on file    Gets together: Not on file    Attends religious service: Not on file    Active member of club or organization: Not on file    Attends meetings of clubs or organizations: Not on file    Relationship status: Not on file  . Intimate partner violence:    Fear of current or ex partner: Not on file    Emotionally abused: Not on file    Physically abused: Not on file    Forced sexual activity: Not on file  Other Topics Concern  . Not on file  Social History Narrative   Exercise almost every day    REVIEW OF SYSTEMS: Constitutional: No fevers, chills, or sweats, no generalized fatigue, change  in appetite Eyes: No visual changes, double vision, eye pain Ear, nose and throat: No hearing loss, ear pain, nasal congestion, sore throat Cardiovascular: No chest pain, palpitations Respiratory:  No shortness of breath at rest or with exertion, wheezes GastrointestinaI: No nausea, vomiting, diarrhea, abdominal pain, fecal incontinence Genitourinary:  No dysuria, urinary retention or frequency Musculoskeletal:  No neck pain, back pain Integumentary: No rash, pruritus, skin lesions Neurological: as above Psychiatric: No depression, insomnia, anxiety Endocrine: No palpitations, fatigue, diaphoresis, mood swings, change in appetite, change in weight, increased thirst Hematologic/Lymphatic:  No purpura, petechiae. Allergic/Immunologic: no itchy/runny eyes, nasal congestion, recent allergic reactions, rashes  PHYSICAL EXAM: *** General: No acute distress.  Patient appears ***-groomed.  *** body habitus. Head:  Normocephalic/atraumatic Eyes:  Fundi examined but not visualized Neck: supple, no paraspinal tenderness, full range of motion Heart:  Regular rate and rhythm Lungs:  Clear to auscultation bilaterally Back: No paraspinal tenderness Neurological Exam: alert and oriented to person, place, and time. Attention span and concentration intact, recent and remote memory intact, fund of knowledge intact.  Speech fluent and not dysarthric, language intact.  CN II-XII intact. Bulk and tone normal, muscle strength 5/5 throughout.  Sensation to light touch, temperature and vibration intact.  Deep tendon reflexes 2+ throughout, toes downgoing.  Finger to nose and heel to shin testing intact.  Gait normal, Romberg negative.  IMPRESSION: ***  PLAN: ***  Metta Clines, DO  CC: ***

## 2018-04-15 ENCOUNTER — Ambulatory Visit: Payer: BLUE CROSS/BLUE SHIELD | Admitting: Neurology

## 2018-04-17 ENCOUNTER — Ambulatory Visit (INDEPENDENT_AMBULATORY_CARE_PROVIDER_SITE_OTHER): Payer: BLUE CROSS/BLUE SHIELD | Admitting: Family Medicine

## 2018-04-17 ENCOUNTER — Encounter: Payer: Self-pay | Admitting: Family Medicine

## 2018-04-17 VITALS — BP 116/86 | HR 78 | Temp 97.8°F | Resp 16 | Ht 70.0 in | Wt 125.0 lb

## 2018-04-17 DIAGNOSIS — G43709 Chronic migraine without aura, not intractable, without status migrainosus: Secondary | ICD-10-CM

## 2018-04-17 DIAGNOSIS — Z79899 Other long term (current) drug therapy: Secondary | ICD-10-CM | POA: Diagnosis not present

## 2018-04-17 DIAGNOSIS — F988 Other specified behavioral and emotional disorders with onset usually occurring in childhood and adolescence: Secondary | ICD-10-CM | POA: Diagnosis not present

## 2018-04-17 NOTE — Progress Notes (Signed)
Patient ID: Tracy Cook, female    DOB: 1956-01-23  Age: 62 y.o. MRN: [id number]    Subjective:  Subjective  HPI ALLEYNE LAC presents for f/u add.  She is doing well with the meds.  No need to adjust the doses.    Review of Systems  Constitutional: Negative for chills and fever.  HENT: Negative for congestion and hearing loss.   Eyes: Negative for discharge.  Respiratory: Negative for cough and shortness of breath.   Cardiovascular: Negative for chest pain, palpitations and leg swelling.  Gastrointestinal: Negative for abdominal pain, blood in stool, constipation, diarrhea, nausea and vomiting.  Genitourinary: Negative for dysuria, frequency, hematuria and urgency.  Musculoskeletal: Negative for back pain and myalgias.  Skin: Negative for rash.  Allergic/Immunologic: Negative for environmental allergies.  Neurological: Negative for dizziness, weakness and headaches.  Hematological: Does not bruise/bleed easily.  Psychiatric/Behavioral: Negative for suicidal ideas. The patient is not nervous/anxious.     History Past Medical History:  Diagnosis Date  . Cardiomyopathy ([place])   . Insomnia   . Menopause   . Migraine   . Orthostatic hypotension   . Osteoporosis   . Other malaise and fatigue   . Persistent disorder of initiating or maintaining sleep   . POTS (postural orthostatic tachycardia syndrome)   . Tachycardia, unspecified     She has no past surgical history on file.   Her family history includes Asthma in her father; Coronary artery disease in her father; Heart attack in her father; Hypertension in her father; Stroke in her mother; Sudden death in her father.She reports that she has never smoked. She has never used smokeless tobacco. She reports that she does not drink alcohol or use drugs.  Current Outpatient Medications on File Prior to Visit  Medication Sig Dispense Refill  . amphetamine-dextroamphetamine (ADDERALL) 10 MG tablet Take 1 tablet (10 mg total)  by mouth 2 (two) times daily. 60 tablet 0  . amphetamine-dextroamphetamine (ADDERALL) 10 MG tablet Take 1 tablet (10 mg total) by mouth 2 (two) times daily. 60 tablet 0  . amphetamine-dextroamphetamine (ADDERALL) 10 MG tablet Take 1 tablet (10 mg total) by mouth 2 (two) times daily. 60 tablet 0  . propranolol (INDERAL) 20 MG tablet TAKE 1 TABLET (20 MG TOTAL) BY MOUTH 3 TIMES DAILY. 90 tablet 5  . SUMAtriptan (IMITREX) 100 MG tablet SEE NOTES 12 tablet 1  . tiZANidine (ZANAFLEX) 4 MG tablet Take 4 mg by mouth as needed.     . topiramate (TOPAMAX) 50 MG tablet Take 2 tablets (100 mg total) by mouth at bedtime. 60 tablet 5  . zolmitriptan (ZOMIG) 5 MG nasal solution Place 1 spray into the nose as needed for migraine. May repeat in 2 hrs if needed. No more than 2/24 6 Units 5   No current facility-administered medications on file prior to visit.      Objective:  Objective  Physical Exam  Constitutional: She is oriented to person, place, and time. She appears well-developed and well-nourished.  HENT:  Head: Normocephalic and atraumatic.  Eyes: Conjunctivae and EOM are normal.  Neck: Normal range of motion. Neck supple. No JVD present. Carotid bruit is not present. No thyromegaly present.  Cardiovascular: Normal rate, regular rhythm and normal heart sounds.  No murmur heard. Pulmonary/Chest: Effort normal and breath sounds normal. No respiratory distress. She has no wheezes. She has no rales. She exhibits no tenderness.  Musculoskeletal: She exhibits no edema.  Neurological: She is alert and oriented to person,  place, and time.  Psychiatric: She has a normal mood and affect.  Nursing note and vitals reviewed.  BP 116/86 (BP Location: Right Arm, Cuff Size: Normal)   Pulse 78   Temp 97.8 F (36.6 C) (Oral)   Resp 16   Ht 5\' 10"  (1.778 m)   Wt 125 lb (56.7 kg)   SpO2 94%   BMI 17.94 kg/m  Wt Readings from Last 3 Encounters:  04/17/18 125 lb (56.7 kg)  01/30/18 125 lb 9.6 oz (57 kg)    12/24/17 123 lb (55.8 kg)     Lab Results  Component Value Date   WBC 10.5 11/13/2016   HGB 13.7 11/13/2016   HCT 41.0 11/13/2016   PLT 278.0 11/13/2016   GLUCOSE 97 02/07/2017   CHOL 117 11/13/2016   TRIG 66.0 11/13/2016   HDL 57.50 11/13/2016   LDLCALC 47 11/13/2016   ALT 24 02/07/2017   AST 22 02/07/2017   NA 141 02/07/2017   K 3.6 02/07/2017   CL 105 02/07/2017   CREATININE 0.85 02/07/2017   BUN 14 02/07/2017   CO2 30 02/07/2017   TSH 0.41 11/13/2016   HGBA1C 5.8 02/07/2017   MICROALBUR <0.7 11/13/2016    Mm Screening Breast Tomo Bilateral  Result Date: 01/10/2017 CLINICAL DATA:  Screening. EXAM: 2D DIGITAL SCREENING BILATERAL MAMMOGRAM WITH CAD AND ADJUNCT TOMO COMPARISON:  Previous exam(s). ACR Breast Density Category c: The breast tissue is heterogeneously dense, which may obscure small masses. FINDINGS: There are no findings suspicious for malignancy. Images were processed with CAD. IMPRESSION: No mammographic evidence of malignancy. A result letter of this screening mammogram will be mailed directly to the patient. RECOMMENDATION: Screening mammogram in one year. (Code:SM-B-01Y) BI-RADS CATEGORY  1: Negative. Electronically Signed   By: Lajean Manes M.D.   On: 01/10/2017 13:44     Assessment & Plan:  Plan  I have discontinued Briseidy H. Coil's eletriptan and predniSONE. I am also having her maintain her tiZANidine, propranolol, topiramate, SUMAtriptan, amphetamine-dextroamphetamine, amphetamine-dextroamphetamine, amphetamine-dextroamphetamine, and zolmitriptan.  No orders of the defined types were placed in this encounter.   Problem List Items Addressed This Visit      Unprioritized   Attention deficit disorder (ADD) without hyperactivity - Primary    Stable con't meds      Relevant Orders   Pain Mgmt, Profile 8 w/Conf, U (Completed)   Chronic migraine without aura without status migrainosus, not intractable    Stable con't meds       Other Visit  Diagnoses    High risk medication use       Relevant Orders   Pain Mgmt, Profile 8 w/Conf, U (Completed)      Follow-up: Return in about 6 months (around 10/16/2018), or if symptoms worsen or fail to improve.  [name], DO

## 2018-04-17 NOTE — Patient Instructions (Signed)

## 2018-04-18 DIAGNOSIS — S32040A Wedge compression fracture of fourth lumbar vertebra, initial encounter for closed fracture: Secondary | ICD-10-CM | POA: Diagnosis not present

## 2018-04-18 DIAGNOSIS — S32050A Wedge compression fracture of fifth lumbar vertebra, initial encounter for closed fracture: Secondary | ICD-10-CM | POA: Diagnosis not present

## 2018-04-18 LAB — PAIN MGMT, PROFILE 8 W/CONF, U
6 Acetylmorphine: NEGATIVE ng/mL (ref <10)
Alcohol Metabolites: NEGATIVE ng/mL (ref <500)
Amphetamines: NEGATIVE ng/mL (ref <500)
Benzodiazepines: NEGATIVE ng/mL (ref <100)
Buprenorphine, Urine: NEGATIVE ng/mL (ref <5)
Cocaine Metabolite: NEGATIVE ng/mL (ref <150)
Creatinine: 16.1 mg/dL — ABNORMAL LOW
MDMA: NEGATIVE ng/mL (ref <500)
Marijuana Metabolite: NEGATIVE ng/mL (ref <20)
Opiates: NEGATIVE ng/mL (ref <100)
Oxidant: NEGATIVE ug/mL (ref <200)
Oxycodone: NEGATIVE ng/mL (ref <100)
Specific Gravity: 1.004 (ref >=1.0)
pH: 7.06 (ref 4.5–9.0)

## 2018-04-20 DIAGNOSIS — F988 Other specified behavioral and emotional disorders with onset usually occurring in childhood and adolescence: Secondary | ICD-10-CM | POA: Insufficient documentation

## 2018-04-20 NOTE — Assessment & Plan Note (Signed)
Stable con't meds 

## 2018-04-21 ENCOUNTER — Telehealth (HOSPITAL_COMMUNITY): Payer: Self-pay

## 2018-04-21 ENCOUNTER — Encounter: Payer: Self-pay | Admitting: Family Medicine

## 2018-04-21 DIAGNOSIS — F988 Other specified behavioral and emotional disorders with onset usually occurring in childhood and adolescence: Secondary | ICD-10-CM

## 2018-04-21 NOTE — Telephone Encounter (Signed)
Called to schedule consult, no answer, left vm. AW  

## 2018-04-22 ENCOUNTER — Telehealth: Payer: Self-pay | Admitting: Family Medicine

## 2018-04-22 ENCOUNTER — Other Ambulatory Visit: Payer: Self-pay | Admitting: Family Medicine

## 2018-04-22 DIAGNOSIS — F988 Other specified behavioral and emotional disorders with onset usually occurring in childhood and adolescence: Secondary | ICD-10-CM

## 2018-04-22 MED ORDER — AMPHETAMINE-DEXTROAMPHETAMINE 10 MG PO TABS: 10.0000 mg | ORAL_TABLET | Freq: Two times a day (BID) | ORAL | 0 refills | Status: DC

## 2018-04-22 NOTE — Telephone Encounter (Signed)
Copied from [place] [id number]. Topic: General - Other >> Apr 22, 2018 11:07 AM Lennox Solders wrote: Reason for CRM: pt is calling and new refill on generic adderall 10mg . Pt saw dr Etter Sjogren on 04-17-18. Pt is out. [facility] [name]

## 2018-04-22 NOTE — Telephone Encounter (Signed)
Pt is requesting refill on Adderall.   Last OV: 04/17/2018 Last Fill: 01/17/2018 #60 and 0RF (For 3 months) UDS: 09/18/2017 Low risk

## 2018-04-22 NOTE — Telephone Encounter (Signed)
Request forwarded to PCP from [place] message yesterday.

## 2018-05-05 MED FILL — PROPRANOLOL 20 MG TABLET: 20 | 30 days supply | Qty: 90 | Fill #5

## 2018-05-09 DIAGNOSIS — M545 Low back pain: Secondary | ICD-10-CM | POA: Diagnosis not present

## 2018-05-09 DIAGNOSIS — S32050A Wedge compression fracture of fifth lumbar vertebra, initial encounter for closed fracture: Secondary | ICD-10-CM | POA: Diagnosis not present

## 2018-05-09 DIAGNOSIS — S32040A Wedge compression fracture of fourth lumbar vertebra, initial encounter for closed fracture: Secondary | ICD-10-CM | POA: Diagnosis not present

## 2018-05-09 DIAGNOSIS — M4716 Other spondylosis with myelopathy, lumbar region: Secondary | ICD-10-CM | POA: Diagnosis not present

## 2018-05-27 DIAGNOSIS — L570 Actinic keratosis: Secondary | ICD-10-CM | POA: Diagnosis not present

## 2018-05-30 DIAGNOSIS — G43719 Chronic migraine without aura, intractable, without status migrainosus: Secondary | ICD-10-CM | POA: Diagnosis not present

## 2018-06-05 DIAGNOSIS — G43709 Chronic migraine without aura, not intractable, without status migrainosus: Secondary | ICD-10-CM | POA: Diagnosis not present

## 2018-07-14 ENCOUNTER — Encounter: Payer: Self-pay | Admitting: Family Medicine

## 2018-07-14 ENCOUNTER — Other Ambulatory Visit: Payer: Self-pay | Admitting: Family Medicine

## 2018-07-14 DIAGNOSIS — F988 Other specified behavioral and emotional disorders with onset usually occurring in childhood and adolescence: Secondary | ICD-10-CM

## 2018-07-14 MED ORDER — AMPHETAMINE-DEXTROAMPHETAMINE 10 MG PO TABS: 10.0000 mg | ORAL_TABLET | Freq: Two times a day (BID) | ORAL | 0 refills | Status: DC

## 2018-07-14 NOTE — Telephone Encounter (Signed)
I sent it--- would you just make sure  uds , contract and database are utd?

## 2018-08-04 ENCOUNTER — Other Ambulatory Visit: Payer: Self-pay | Admitting: Family Medicine

## 2018-08-04 DIAGNOSIS — I429 Cardiomyopathy, unspecified: Secondary | ICD-10-CM

## 2018-08-07 MED FILL — PROPRANOLOL 20 MG TABLET: 20 | 30 days supply | Qty: 90 | Fill #0

## 2018-09-03 DIAGNOSIS — F1520 Other stimulant dependence, uncomplicated: Secondary | ICD-10-CM | POA: Diagnosis not present

## 2018-09-03 DIAGNOSIS — G43709 Chronic migraine without aura, not intractable, without status migrainosus: Secondary | ICD-10-CM | POA: Diagnosis not present

## 2018-09-18 DIAGNOSIS — M62830 Muscle spasm of back: Secondary | ICD-10-CM | POA: Diagnosis not present

## 2018-09-23 DIAGNOSIS — G43719 Chronic migraine without aura, intractable, without status migrainosus: Secondary | ICD-10-CM | POA: Diagnosis not present

## 2018-10-01 DIAGNOSIS — G43709 Chronic migraine without aura, not intractable, without status migrainosus: Secondary | ICD-10-CM | POA: Diagnosis not present

## 2018-10-02 MED FILL — PROPRANOLOL 20 MG TABLET: 20 | 30 days supply | Qty: 90 | Fill #1 | Status: TO

## 2018-10-10 ENCOUNTER — Ambulatory Visit (INDEPENDENT_AMBULATORY_CARE_PROVIDER_SITE_OTHER): Payer: BLUE CROSS/BLUE SHIELD | Admitting: Family Medicine

## 2018-10-10 ENCOUNTER — Other Ambulatory Visit: Payer: Self-pay

## 2018-10-10 ENCOUNTER — Encounter: Payer: Self-pay | Admitting: Family Medicine

## 2018-10-10 VITALS — BP 100/70 | HR 82 | Temp 98.0°F | Resp 12 | Ht 70.0 in | Wt 126.8 lb

## 2018-10-10 DIAGNOSIS — Z79899 Other long term (current) drug therapy: Secondary | ICD-10-CM

## 2018-10-10 DIAGNOSIS — F988 Other specified behavioral and emotional disorders with onset usually occurring in childhood and adolescence: Secondary | ICD-10-CM | POA: Diagnosis not present

## 2018-10-10 MED ORDER — AMPHETAMINE-DEXTROAMPHETAMINE 10 MG PO TABS: 10.0000 mg | ORAL_TABLET | Freq: Two times a day (BID) | ORAL | 0 refills | Status: DC

## 2018-10-10 NOTE — Assessment & Plan Note (Addendum)
Stable con't meds rto 6 months  Offered shingrix -- she wanted to hold off for now

## 2018-10-10 NOTE — Progress Notes (Signed)
Patient ID: Tracy Cook, female    DOB: 05-15-1956  Age: 63 y.o. MRN: [id number]    Subjective:  Subjective  HPI MELODYE SWOR presents for f/u add.  No complaints.  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    Review of Systems  Constitutional: Negative for appetite change, diaphoresis, fatigue and unexpected weight change.  Eyes: Negative for pain, redness and visual disturbance.  Respiratory: Negative for cough, chest tightness, shortness of breath and wheezing.   Cardiovascular: Negative for chest pain, palpitations and leg swelling.  Endocrine: Negative for cold intolerance, heat intolerance, polydipsia, polyphagia and polyuria.  Genitourinary: Negative for difficulty urinating, dysuria and frequency.  Neurological: Negative for dizziness, light-headedness, numbness and headaches.    History Past Medical History:  Diagnosis Date  . Cardiomyopathy ([place])   . Insomnia   . Menopause   . Migraine   . Orthostatic hypotension   . Osteoporosis   . Other malaise and fatigue   . Persistent disorder of initiating or maintaining sleep   . POTS (postural orthostatic tachycardia syndrome)   . Tachycardia, unspecified     She has no past surgical history on file.   Her family history includes Asthma in her father; Coronary artery disease in her father; Heart attack in her father; Hypertension in her father; Stroke in her mother; Sudden death in her father.She reports that she has never smoked. She has never used smokeless tobacco. She reports that she does not drink alcohol or use drugs.  Current Outpatient Medications on File Prior to Visit  Medication Sig Dispense Refill  . propranolol (INDERAL) 20 MG tablet TAKE 1 TABLET (20 MG TOTAL) BY MOUTH 3 TIMES DAILY. 90 tablet 5  . SUMAtriptan (IMITREX) 100 MG tablet SEE NOTES 12 tablet 1  . tiZANidine (ZANAFLEX) 4 MG tablet Take 4 mg by mouth as needed.     .  topiramate (TOPAMAX) 50 MG tablet Take 2 tablets (100 mg total) by mouth at bedtime. 60 tablet 5   No current facility-administered medications on file prior to visit.      Objective:  Objective  Physical Exam Vitals signs and nursing note reviewed.  Constitutional:      Appearance: She is well-developed.  HENT:     Head: Normocephalic and atraumatic.  Eyes:     Conjunctiva/sclera: Conjunctivae normal.  Neck:     Musculoskeletal: Normal range of motion and neck supple.     Thyroid: No thyromegaly.     Vascular: No carotid bruit or JVD.  Cardiovascular:     Rate and Rhythm: Normal rate and regular rhythm.     Heart sounds: Normal heart sounds. No murmur.  Pulmonary:     Effort: Pulmonary effort is normal. No respiratory distress.     Breath sounds: Normal breath sounds. No wheezing or rales.  Chest:     Chest wall: No tenderness.  Neurological:     Mental Status: She is alert and oriented to person, place, and time.    BP 100/70 (BP Location: Right Arm, Cuff Size: Normal)   Pulse 82   Temp 98 F (36.7 C) (Oral)   Resp 12   Ht 5\' 10"  (1.778 m)   Wt 126 lb 12.8 oz (57.5 kg)   SpO2 97%   BMI 18.19 kg/m  Wt Readings from Last 3 Encounters:  10/10/18 126 lb 12.8 oz (57.5 kg)  04/17/18 125 lb (56.7  kg)  01/30/18 125 lb 9.6 oz (57 kg)     Lab Results  Component Value Date   WBC 10.5 11/13/2016   HGB 13.7 11/13/2016   HCT 41.0 11/13/2016   PLT 278.0 11/13/2016   GLUCOSE 97 02/07/2017   CHOL 117 11/13/2016   TRIG 66.0 11/13/2016   HDL 57.50 11/13/2016   LDLCALC 47 11/13/2016   ALT 24 02/07/2017   AST 22 02/07/2017   NA 141 02/07/2017   K 3.6 02/07/2017   CL 105 02/07/2017   CREATININE 0.85 02/07/2017   BUN 14 02/07/2017   CO2 30 02/07/2017   TSH 0.41 11/13/2016   HGBA1C 5.8 02/07/2017   MICROALBUR <0.7 11/13/2016    Mm Screening Breast Tomo Bilateral  Result Date: 01/10/2017 CLINICAL DATA:  Screening. EXAM: 2D DIGITAL SCREENING BILATERAL MAMMOGRAM WITH  CAD AND ADJUNCT TOMO COMPARISON:  Previous exam(s). ACR Breast Density Category c: The breast tissue is heterogeneously dense, which may obscure small masses. FINDINGS: There are no findings suspicious for malignancy. Images were processed with CAD. IMPRESSION: No mammographic evidence of malignancy. A result letter of this screening mammogram will be mailed directly to the patient. RECOMMENDATION: Screening mammogram in one year. (Code:SM-B-01Y) BI-RADS CATEGORY  1: Negative. Electronically Signed   By: Lajean Manes M.D.   On: 01/10/2017 13:44     Assessment & Plan:  Plan  I have discontinued Khalie H. Huge's zolmitriptan. I am also having her maintain her tiZANidine, topiramate, SUMAtriptan, propranolol, amphetamine-dextroamphetamine, amphetamine-dextroamphetamine, and amphetamine-dextroamphetamine.  Meds ordered this encounter  Medications  . amphetamine-dextroamphetamine (ADDERALL) 10 MG tablet    Sig: Take 1 tablet (10 mg total) by mouth 2 (two) times daily.    Dispense:  60 tablet    Refill:  0    For June 2020  . amphetamine-dextroamphetamine (ADDERALL) 10 MG tablet    Sig: Take 1 tablet (10 mg total) by mouth 2 (two) times daily.    Dispense:  60 tablet    Refill:  0    For May 2020  . amphetamine-dextroamphetamine (ADDERALL) 10 MG tablet    Sig: Take 1 tablet (10 mg total) by mouth 2 (two) times daily.    Dispense:  60 tablet    Refill:  0    Do not fill until April 2020    Problem List Items Addressed This Visit      Unprioritized   Attention deficit disorder (ADD) without hyperactivity - Primary    Stable con't meds rto 6 months  Offered shingrix -- she wanted to hold off for now      Relevant Medications   amphetamine-dextroamphetamine (ADDERALL) 10 MG tablet   amphetamine-dextroamphetamine (ADDERALL) 10 MG tablet   amphetamine-dextroamphetamine (ADDERALL) 10 MG tablet   Other Relevant Orders   Pain Mgmt, Profile 8 w/Conf, U    Other Visit Diagnoses     High risk medication use       Relevant Orders   Pain Mgmt, Profile 8 w/Conf, U      Follow-up: Return in about 6 months (around 04/12/2019) for annual exam, fasting.  [name], DO

## 2018-10-10 NOTE — Patient Instructions (Signed)

## 2018-10-12 LAB — PAIN MGMT, PROFILE 8 W/CONF, U
6 Acetylmorphine: NEGATIVE ng/mL (ref <10)
Alcohol Metabolites: NEGATIVE ng/mL (ref <500)
Amphetamine: 3546 ng/mL — ABNORMAL HIGH (ref <250)
Amphetamines: POSITIVE ng/mL — AB (ref <500)
Benzodiazepines: NEGATIVE ng/mL (ref <100)
Buprenorphine, Urine: NEGATIVE ng/mL (ref <5)
Cocaine Metabolite: NEGATIVE ng/mL (ref <150)
Creatinine: 30.8 mg/dL
MDMA: NEGATIVE ng/mL (ref <500)
Marijuana Metabolite: NEGATIVE ng/mL (ref <20)
Methamphetamine: NEGATIVE ng/mL (ref <250)
Opiates: NEGATIVE ng/mL (ref <100)
Oxidant: NEGATIVE ug/mL (ref <200)
Oxycodone: NEGATIVE ng/mL (ref <100)
pH: 6.42 (ref 4.5–9.0)

## 2018-11-06 ENCOUNTER — Ambulatory Visit (INDEPENDENT_AMBULATORY_CARE_PROVIDER_SITE_OTHER): Payer: BLUE CROSS/BLUE SHIELD | Admitting: Family Medicine

## 2018-11-06 ENCOUNTER — Encounter: Payer: Self-pay | Admitting: Family Medicine

## 2018-11-06 ENCOUNTER — Other Ambulatory Visit: Payer: Self-pay

## 2018-11-06 DIAGNOSIS — M5481 Occipital neuralgia: Secondary | ICD-10-CM

## 2018-11-06 MED ORDER — ZOLMITRIPTAN 5 MG NA SOLN: 1.0000 | NASAL | 0 refills | Status: DC | PRN

## 2018-11-06 NOTE — Progress Notes (Signed)
Virtual Visit via Video Note  I connected with Shanon Brow on 00/37/04 at  9:00 AM EDT by a video enabled telemedicine application and verified that I am speaking with the correct person using two identifiers.   I discussed the limitations of evaluation and management by telemedicine and the availability of in person appointments. The patient expressed understanding and agreed to proceed.  History of Present Illness: Pt is home -- complaining of her occipital neuralgia flaring up.  She was getting injections of botox from neuro but it did not work.  She has an appointment with Dr Nelva Bush for a nerve block so she is hoping that helps.  She is out of her immitrex and would like zomig  Nasal spray to help until she sees Dr Nelva Bush.  No other complaints.  Past Medical History:  Diagnosis Date  . Cardiomyopathy ([place])   . Insomnia   . Menopause   . Migraine   . Orthostatic hypotension   . Osteoporosis   . Other malaise and fatigue   . Persistent disorder of initiating or maintaining sleep   . POTS (postural orthostatic tachycardia syndrome)   . Tachycardia, unspecified    Current Outpatient Medications on File Prior to Visit  Medication Sig Dispense Refill  . amphetamine-dextroamphetamine (ADDERALL) 10 MG tablet Take 1 tablet (10 mg total) by mouth 2 (two) times daily. 60 tablet 0  . amphetamine-dextroamphetamine (ADDERALL) 10 MG tablet Take 1 tablet (10 mg total) by mouth 2 (two) times daily. 60 tablet 0  . amphetamine-dextroamphetamine (ADDERALL) 10 MG tablet Take 1 tablet (10 mg total) by mouth 2 (two) times daily. 60 tablet 0  . propranolol (INDERAL) 20 MG tablet TAKE 1 TABLET (20 MG TOTAL) BY MOUTH 3 TIMES DAILY. 90 tablet 5  . SUMAtriptan (IMITREX) 100 MG tablet SEE NOTES 12 tablet 1  . tiZANidine (ZANAFLEX) 4 MG tablet Take 4 mg by mouth as needed.     . topiramate (TOPAMAX) 50 MG tablet Take 2 tablets (100 mg total) by mouth at bedtime. 60 tablet 5   No current  facility-administered medications on file prior to visit.       Observations/Objective: bp low per pt,  Afebrile  Pt in NAD  Assessment and Plan: 1. Occipital neuralgia, unspecified laterality imitrex can be filled at the end of the month.  She had zomig nasal spray to use from neuro --- pt not going back to neuro -- will see dr Nelva Bush next week  - zolmitriptan (ZOMIG) 5 MG nasal solution; Place 1 spray into the nose as needed for migraine.  Dispense: 6 Units; Refill: 0   Follow Up Instructions:    I discussed the assessment and treatment plan with the patient. The patient was provided an opportunity to ask questions and all were answered. The patient agreed with the plan and demonstrated an understanding of the instructions.   The patient was advised to call back or seek an in-person evaluation if the symptoms worsen or if the condition fails to improve as anticipated.     [name], DO

## 2018-11-07 DIAGNOSIS — M5481 Occipital neuralgia: Secondary | ICD-10-CM | POA: Diagnosis not present

## 2018-11-07 DIAGNOSIS — M5030 Other cervical disc degeneration, unspecified cervical region: Secondary | ICD-10-CM | POA: Diagnosis not present

## 2018-11-12 ENCOUNTER — Encounter: Payer: Self-pay | Admitting: Family Medicine

## 2018-11-13 DIAGNOSIS — M5481 Occipital neuralgia: Secondary | ICD-10-CM | POA: Diagnosis not present

## 2018-11-14 ENCOUNTER — Other Ambulatory Visit: Payer: Self-pay

## 2018-11-14 ENCOUNTER — Encounter: Payer: Self-pay | Admitting: Family Medicine

## 2018-11-14 ENCOUNTER — Ambulatory Visit (INDEPENDENT_AMBULATORY_CARE_PROVIDER_SITE_OTHER): Payer: BLUE CROSS/BLUE SHIELD | Admitting: Family Medicine

## 2018-11-14 VITALS — BP 107/70 | HR 78 | Temp 98.2°F | Resp 12 | Ht 70.0 in | Wt 128.0 lb

## 2018-11-14 DIAGNOSIS — N6320 Unspecified lump in the left breast, unspecified quadrant: Secondary | ICD-10-CM

## 2018-11-14 NOTE — Patient Instructions (Signed)
Fibrocystic Breast Changes  Fibrocystic breast changes are changes in breast tissue that can cause breasts to become swollen, lumpy, or painful. This can happen due to buildup of scar-like tissue (fibrous tissue) or the forming of fluid-filled lumps (cysts) in the breast. This is a common condition, and it is not cancerous (is benign). The exact cause is not known, but it seems to occur when women go through hormonal changes during their menstrual cycle. Fibrocystic breast changes can affect one or both breasts. What are the causes? The exact cause of fibrocystic breast changes is not known. However, this condition:  May be related to the female hormones estrogen and progesterone.  May be influenced by family traits that get passed from parent to child (genetics). What are the signs or symptoms? Symptoms of this condition may affect one or both breasts, and may include:  Tenderness, mild discomfort, or pain.  Swelling.  Rope-like tissue that can be felt when touching the breast.  Lumps in one or both breasts.  Changes in breast size. Breasts may get larger before the menstrual period and smaller after the menstrual period.  Green or dark brown discharge from the nipple. Symptoms are usually worse before menstrual periods start, and they get better toward the end of menstrual periods. How is this diagnosed? This condition is diagnosed based on your medical history and a physical exam of your breasts. You may also have tests, such as:  A breast X-ray (mammogram).  Ultrasound of your breasts.  MRI.  Removal of a breast tissue sample for testing (breast biopsy). This may be done if your health care provider thinks that something else may be causing changes in your breasts. How is this treated? Often, treatment is not needed for this condition. In some cases, treatment may include:  Taking over-the-counter pain relievers to help lessen pain or discomfort.  Limiting or avoiding  caffeine. Foods and beverages that contain caffeine include chocolate, soda, coffee, and tea.  Reducing sugar and fat in your diet. Your health care provider may also recommend:  A procedure to remove fluid from a cyst that is causing pain (fine needle aspiration).  Surgery to remove a cyst that is large or tender or does not go away. Follow these instructions at home:  Examine your breasts after every menstrual period. If you do not have menstrual periods, check your breasts on the first day of every month. Feel for changes in your breasts, such as: ? More tenderness. ? A new growth. ? A change in size. ? A change in an existing lump.  Take over-the-counter and prescription medicines only as told by your health care provider.  Wear a well-fitted support or sports bra, especially when exercising.  Decrease or avoid caffeine, fat, and sugar in your diet as directed by your health care provider. Contact a health care provider if:  You have fluid leaking from your nipple, especially if it is bloody.  You have new lumps or bumps in your breast.  Your breast becomes enlarged, red, and painful.  You have areas of your breast that pucker inward.  Your nipple appears flat or indented. Get help right away if:  You have redness of your breast and the redness is spreading. Summary  Fibrocystic breast changes are changes in breast tissue that can cause breasts to become swollen, lumpy, or painful.  This condition may be related to the female hormones estrogen and progesterone.  With this condition, it is important to examine your breasts after every   menstrual period. If you do not have menstrual periods, check your breasts on the first day of every month. This information is not intended to replace advice given to you by your health care provider. Make sure you discuss any questions you have with your health care provider. Document Released: 04/25/2006 Document Revised: 03/20/2016  Document Reviewed: 03/07/2016 Elsevier Interactive Patient Education  2019 [employer].

## 2018-11-14 NOTE — Progress Notes (Signed)
Patient ID: Tracy Cook, female    DOB: 1956/01/09  Age: 63 y.o. MRN: [id number]    Subjective:  Subjective  HPI Tracy Cook presents for mass L breast that she noticed recently--- it is in the L breast and is the size of her thumb nail   Not tender  Review of Systems  Constitutional: Negative for appetite change, diaphoresis, fatigue and unexpected weight change.  Eyes: Negative for pain, redness and visual disturbance.  Respiratory: Negative for cough, chest tightness, shortness of breath and wheezing.   Cardiovascular: Negative for chest pain, palpitations and leg swelling.  Endocrine: Negative for cold intolerance, heat intolerance, polydipsia, polyphagia and polyuria.  Genitourinary: Negative for difficulty urinating, dysuria and frequency.  Neurological: Negative for dizziness, light-headedness, numbness and headaches.    History Past Medical History:  Diagnosis Date  . Cardiomyopathy ([place])   . Insomnia   . Menopause   . Migraine   . Orthostatic hypotension   . Osteoporosis   . Other malaise and fatigue   . Persistent disorder of initiating or maintaining sleep   . POTS (postural orthostatic tachycardia syndrome)   . Tachycardia, unspecified     She has no past surgical history on file.   Her family history includes Asthma in her father; Coronary artery disease in her father; Heart attack in her father; Hypertension in her father; Stroke in her mother; Sudden death in her father.She reports that she has never smoked. She has never used smokeless tobacco. She reports that she does not drink alcohol or use drugs.  Current Outpatient Medications on File Prior to Visit  Medication Sig Dispense Refill  . amphetamine-dextroamphetamine (ADDERALL) 10 MG tablet Take 1 tablet (10 mg total) by mouth 2 (two) times daily. 60 tablet 0  . amphetamine-dextroamphetamine (ADDERALL) 10 MG tablet Take 1 tablet (10 mg total) by mouth 2 (two) times daily. 60 tablet 0  .  amphetamine-dextroamphetamine (ADDERALL) 10 MG tablet Take 1 tablet (10 mg total) by mouth 2 (two) times daily. 60 tablet 0  . propranolol (INDERAL) 20 MG tablet TAKE 1 TABLET (20 MG TOTAL) BY MOUTH 3 TIMES DAILY. 90 tablet 5  . SUMAtriptan (IMITREX) 100 MG tablet SEE NOTES 12 tablet 1  . tiZANidine (ZANAFLEX) 4 MG tablet Take 4 mg by mouth as needed.     . topiramate (TOPAMAX) 50 MG tablet Take 2 tablets (100 mg total) by mouth at bedtime. 60 tablet 5  . zolmitriptan (ZOMIG) 5 MG nasal solution Place 1 spray into the nose as needed for migraine. 6 Units 0   No current facility-administered medications on file prior to visit.      Objective:  Objective  Physical Exam Chest:     Chest wall: Mass present. No tenderness.      BP 107/70 (BP Location: Left Arm, Cuff Size: Normal)   Pulse 78   Temp 98.2 F (36.8 C) (Oral)   Resp 12   Ht 5\' 10"  (1.778 m)   Wt 128 lb (58.1 kg)   SpO2 100%   BMI 18.37 kg/m  Wt Readings from Last 3 Encounters:  11/14/18 128 lb (58.1 kg)  10/10/18 126 lb 12.8 oz (57.5 kg)  04/17/18 125 lb (56.7 kg)     Lab Results  Component Value Date   WBC 10.5 11/13/2016   HGB 13.7 11/13/2016   HCT 41.0 11/13/2016   PLT 278.0 11/13/2016   GLUCOSE 97 02/07/2017   CHOL 117 11/13/2016   TRIG 66.0 11/13/2016   HDL  57.50 11/13/2016   LDLCALC 47 11/13/2016   ALT 24 02/07/2017   AST 22 02/07/2017   NA 141 02/07/2017   K 3.6 02/07/2017   CL 105 02/07/2017   CREATININE 0.85 02/07/2017   BUN 14 02/07/2017   CO2 30 02/07/2017   TSH 0.41 11/13/2016   HGBA1C 5.8 02/07/2017   MICROALBUR <0.7 11/13/2016    Mm Screening Breast Tomo Bilateral  Result Date: 01/10/2017 CLINICAL DATA:  Screening. EXAM: 2D DIGITAL SCREENING BILATERAL MAMMOGRAM WITH CAD AND ADJUNCT TOMO COMPARISON:  Previous exam(s). ACR Breast Density Category c: The breast tissue is heterogeneously dense, which may obscure small masses. FINDINGS: There are no findings suspicious for malignancy.  Images were processed with CAD. IMPRESSION: No mammographic evidence of malignancy. A result letter of this screening mammogram will be mailed directly to the patient. RECOMMENDATION: Screening mammogram in one year. (Code:SM-B-01Y) BI-RADS CATEGORY  1: Negative. Electronically Signed   By: Lajean Manes M.D.   On: 01/10/2017 13:44     Assessment & Plan:  Plan  I am having Shanon Brow maintain her tiZANidine, topiramate, SUMAtriptan, propranolol, amphetamine-dextroamphetamine, amphetamine-dextroamphetamine, amphetamine-dextroamphetamine, and zolmitriptan.  No orders of the defined types were placed in this encounter.   Problem List Items Addressed This Visit    None    Visit Diagnoses    Mass of left breast    -  Primary   Relevant Orders   MM Digital Diagnostic Bilat      Follow-up: No follow-ups on file.  [name], DO

## 2018-11-17 ENCOUNTER — Other Ambulatory Visit: Payer: Self-pay | Admitting: Family Medicine

## 2018-11-17 DIAGNOSIS — N6320 Unspecified lump in the left breast, unspecified quadrant: Secondary | ICD-10-CM

## 2018-11-25 ENCOUNTER — Ambulatory Visit
Admission: RE | Admit: 2018-11-25 | Discharge: 2018-11-25 | Disposition: A | Discharge status: Home / Self Care | Payer: BLUE CROSS/BLUE SHIELD | Source: Ambulatory Visit | Attending: Family Medicine | Admitting: Family Medicine

## 2018-11-25 ENCOUNTER — Other Ambulatory Visit: Payer: Self-pay | Admitting: Pediatrics

## 2018-11-25 ENCOUNTER — Other Ambulatory Visit: Payer: Self-pay

## 2018-11-25 DIAGNOSIS — N6320 Unspecified lump in the left breast, unspecified quadrant: Secondary | ICD-10-CM

## 2018-11-25 DIAGNOSIS — N644 Mastodynia: Secondary | ICD-10-CM | POA: Diagnosis not present

## 2018-12-04 DIAGNOSIS — M5481 Occipital neuralgia: Secondary | ICD-10-CM | POA: Diagnosis not present

## 2018-12-29 DIAGNOSIS — M545 Low back pain: Secondary | ICD-10-CM | POA: Diagnosis not present

## 2019-01-14 ENCOUNTER — Encounter: Payer: Self-pay | Admitting: Family Medicine

## 2019-01-15 ENCOUNTER — Other Ambulatory Visit: Payer: Self-pay | Admitting: Family Medicine

## 2019-01-15 DIAGNOSIS — F988 Other specified behavioral and emotional disorders with onset usually occurring in childhood and adolescence: Secondary | ICD-10-CM

## 2019-01-15 MED ORDER — AMPHETAMINE-DEXTROAMPHETAMINE 10 MG PO TABS: 10.0000 mg | ORAL_TABLET | Freq: Two times a day (BID) | ORAL | 0 refills | Status: DC

## 2019-01-19 ENCOUNTER — Telehealth: Payer: Self-pay

## 2019-01-19 NOTE — Telephone Encounter (Signed)
Copied from [id number]. Topic: General - Inquiry >> Jan 19, 2019 10:56 AM Alanda Slim E wrote: Reason for CRM: Pt is traveling to [place] on 8.8.20 and has been advised that she needs to have taken a covid PCR test 72 hours before arrival and needs the [place] enter [place]. ? Pt wants to know if this is possible now that this is required for most travel/ please advise

## 2019-01-27 NOTE — Telephone Encounter (Signed)
Spoke with pt and gave advise per Dr. Charlett Blake instructions.

## 2019-03-20 DIAGNOSIS — M25512 Pain in left shoulder: Secondary | ICD-10-CM | POA: Diagnosis not present

## 2019-03-20 DIAGNOSIS — M7542 Impingement syndrome of left shoulder: Secondary | ICD-10-CM | POA: Diagnosis not present

## 2019-04-15 DIAGNOSIS — M545 Low back pain: Secondary | ICD-10-CM | POA: Diagnosis not present

## 2019-04-17 ENCOUNTER — Other Ambulatory Visit: Payer: Self-pay

## 2019-04-20 ENCOUNTER — Other Ambulatory Visit: Payer: Self-pay

## 2019-04-20 ENCOUNTER — Encounter: Payer: Self-pay | Admitting: Family Medicine

## 2019-04-20 ENCOUNTER — Ambulatory Visit (INDEPENDENT_AMBULATORY_CARE_PROVIDER_SITE_OTHER): Payer: BC Managed Care – PPO | Admitting: Family Medicine

## 2019-04-20 DIAGNOSIS — F988 Other specified behavioral and emotional disorders with onset usually occurring in childhood and adolescence: Secondary | ICD-10-CM

## 2019-04-20 MED ORDER — AMPHETAMINE-DEXTROAMPHETAMINE 10 MG PO TABS: 10.0000 mg | ORAL_TABLET | Freq: Two times a day (BID) | ORAL | 0 refills | Status: DC

## 2019-04-20 NOTE — Progress Notes (Signed)
Patient ID: Tracy Cook, female    DOB: 10-18-1955  Age: 63 y.o. MRN: [id number]    Subjective:  Subjective  HPI Tracy Cook presents for f/u add  Pt with no complaints.    Review of Systems  Constitutional: Negative for activity change, appetite change, fatigue and unexpected weight change.  Respiratory: Negative for cough and shortness of breath.   Cardiovascular: Negative for chest pain and palpitations.  Psychiatric/Behavioral: Negative for behavioral problems and dysphoric mood. The patient is not nervous/anxious.     History Past Medical History:  Diagnosis Date  . Cardiomyopathy ([place])   . Insomnia   . Menopause   . Migraine   . Orthostatic hypotension   . Osteoporosis   . Other malaise and fatigue   . Persistent disorder of initiating or maintaining sleep   . POTS (postural orthostatic tachycardia syndrome)   . Tachycardia, unspecified     She has no past surgical history on file.   Her family history includes Asthma in her father; Coronary artery disease in her father; Heart attack in her father; Hypertension in her father; Stroke in her mother; Sudden death in her father.She reports that she has never smoked. She has never used smokeless tobacco. She reports that she does not drink alcohol or use drugs.  Current Outpatient Medications on File Prior to Visit  Medication Sig Dispense Refill  . propranolol (INDERAL) 20 MG tablet TAKE 1 TABLET (20 MG TOTAL) BY MOUTH 3 TIMES DAILY. 90 tablet 5  . SUMAtriptan (IMITREX) 100 MG tablet SEE NOTES 12 tablet 1  . tiZANidine (ZANAFLEX) 4 MG tablet Take 4 mg by mouth as needed.     . topiramate (TOPAMAX) 50 MG tablet Take 2 tablets (100 mg total) by mouth at bedtime. 60 tablet 5   No current facility-administered medications on file prior to visit.      Objective:  Objective  Physical Exam Vitals signs and nursing note reviewed.  Constitutional:      Appearance: She is well-developed.  HENT:     Head:  Normocephalic and atraumatic.  Eyes:     Conjunctiva/sclera: Conjunctivae normal.  Neck:     Musculoskeletal: Normal range of motion and neck supple.     Thyroid: No thyromegaly.     Vascular: No carotid bruit or JVD.  Cardiovascular:     Rate and Rhythm: Normal rate and regular rhythm.     Heart sounds: Normal heart sounds. No murmur.  Pulmonary:     Effort: Pulmonary effort is normal. No respiratory distress.     Breath sounds: Normal breath sounds. No wheezing or rales.  Chest:     Chest wall: No tenderness.  Neurological:     Mental Status: She is alert and oriented to person, place, and time.    BP 98/70 (BP Location: Right Arm, Patient Position: Sitting, Cuff Size: Normal)   Pulse 80   Temp 97.9 F (36.6 C) (Temporal)   Resp 18   Ht 5\' 10"  (1.778 m)   Wt 125 lb 12.8 oz (57.1 kg)   SpO2 97%   BMI 18.05 kg/m  Wt Readings from Last 3 Encounters:  04/20/19 125 lb 12.8 oz (57.1 kg)  11/14/18 128 lb (58.1 kg)  10/10/18 126 lb 12.8 oz (57.5 kg)     Lab Results  Component Value Date   WBC 10.5 11/13/2016   HGB 13.7 11/13/2016   HCT 41.0 11/13/2016   PLT 278.0 11/13/2016   GLUCOSE 97 02/07/2017   CHOL  117 11/13/2016   TRIG 66.0 11/13/2016   HDL 57.50 11/13/2016   LDLCALC 47 11/13/2016   ALT 24 02/07/2017   AST 22 02/07/2017   NA 141 02/07/2017   K 3.6 02/07/2017   CL 105 02/07/2017   CREATININE 0.85 02/07/2017   BUN 14 02/07/2017   CO2 30 02/07/2017   TSH 0.41 11/13/2016   HGBA1C 5.8 02/07/2017   MICROALBUR <0.7 11/13/2016    US Breast Ltd Uni Left Inc Axilla  Result Date: 11/25/2018 CLINICAL DATA:  63 year old patient with palpable area of concern with tenderness in the 5-6 o'clock region of the left breast. Due for annual mammogram. EXAM: DIGITAL DIAGNOSTIC BILATERAL MAMMOGRAM WITH CAD AND TOMO ULTRASOUND LEFT BREAST COMPARISON:  Previous exam(s). ACR Breast Density Category c: The breast tissue is heterogeneously dense, which may obscure small masses.  FINDINGS: No mass, architectural distortion, or suspicious microcalcification is identified to suggest malignancy in either breast. Spot tangential compression view of the region of patient concern in the lower outer left breast is negative. Mammographic images were processed with CAD. On physical exam, I palpate soft breast parenchyma in the inferior left breast in the region of recent patient concern. No suspicious mass is palpated. The skin appears normal. Targeted ultrasound is performed, showing normal breast parenchyma in the lower outer quadrant of the left breast. No evidence of malignancy. IMPRESSION: No evidence of malignancy in either breast. RECOMMENDATION: Screening mammogram in one year.(Code:SM-B-01Y) I have discussed the findings and recommendations with the patient. Results were also provided in writing at the conclusion of the visit. If applicable, a reminder letter will be sent to the patient regarding the next appointment. BI-RADS CATEGORY  1: Negative. Electronically Signed   By: Curlene Dolphin M.D.   On: 11/25/2018 09:06   Mm Diag Breast Tomo Bilateral  Result Date: 11/25/2018 CLINICAL DATA:  63 year old patient with palpable area of concern with tenderness in the 5-6 o'clock region of the left breast. Due for annual mammogram. EXAM: DIGITAL DIAGNOSTIC BILATERAL MAMMOGRAM WITH CAD AND TOMO ULTRASOUND LEFT BREAST COMPARISON:  Previous exam(s). ACR Breast Density Category c: The breast tissue is heterogeneously dense, which may obscure small masses. FINDINGS: No mass, architectural distortion, or suspicious microcalcification is identified to suggest malignancy in either breast. Spot tangential compression view of the region of patient concern in the lower outer left breast is negative. Mammographic images were processed with CAD. On physical exam, I palpate soft breast parenchyma in the inferior left breast in the region of recent patient concern. No suspicious mass is palpated. The skin  appears normal. Targeted ultrasound is performed, showing normal breast parenchyma in the lower outer quadrant of the left breast. No evidence of malignancy. IMPRESSION: No evidence of malignancy in either breast. RECOMMENDATION: Screening mammogram in one year.(Code:SM-B-01Y) I have discussed the findings and recommendations with the patient. Results were also provided in writing at the conclusion of the visit. If applicable, a reminder letter will be sent to the patient regarding the next appointment. BI-RADS CATEGORY  1: Negative. Electronically Signed   By: Curlene Dolphin M.D.   On: 11/25/2018 09:06     Assessment & Plan:  Plan  I have discontinued Trenton H. Wieland's zolmitriptan. I am also having her maintain her tiZANidine, topiramate, SUMAtriptan, propranolol, amphetamine-dextroamphetamine, amphetamine-dextroamphetamine, and amphetamine-dextroamphetamine.  Meds ordered this encounter  Medications  . amphetamine-dextroamphetamine (ADDERALL) 10 MG tablet    Sig: Take 1 tablet (10 mg total) by mouth 2 (two) times daily.    Dispense:  60 tablet    Refill:  0    Do not fill until dec 2020  . amphetamine-dextroamphetamine (ADDERALL) 10 MG tablet    Sig: Take 1 tablet (10 mg total) by mouth 2 (two) times daily.    Dispense:  60 tablet    Refill:  0    For Nov 2020  . amphetamine-dextroamphetamine (ADDERALL) 10 MG tablet    Sig: Take 1 tablet (10 mg total) by mouth 2 (two) times daily.    Dispense:  60 tablet    Refill:  0    Do not fill until oct  2020    Problem List Items Addressed This Visit      Unprioritized   Attention deficit disorder (ADD) without hyperactivity    Pt needs refills on meds No complaints She wants to wait on flu shot      Relevant Medications   amphetamine-dextroamphetamine (ADDERALL) 10 MG tablet   amphetamine-dextroamphetamine (ADDERALL) 10 MG tablet   amphetamine-dextroamphetamine (ADDERALL) 10 MG tablet      Follow-up: Return in about 6 months  (around 10/18/2019), or if symptoms worsen or fail to improve, for  .  [name], DO

## 2019-04-20 NOTE — Patient Instructions (Signed)

## 2019-04-20 NOTE — Assessment & Plan Note (Signed)
Pt needs refills on meds No complaints She wants to wait on flu shot

## 2019-04-21 DIAGNOSIS — M7542 Impingement syndrome of left shoulder: Secondary | ICD-10-CM | POA: Diagnosis not present

## 2019-04-23 ENCOUNTER — Other Ambulatory Visit: Payer: Self-pay | Admitting: Family Medicine

## 2019-04-23 DIAGNOSIS — I429 Cardiomyopathy, unspecified: Secondary | ICD-10-CM

## 2019-04-28 DIAGNOSIS — M810 Age-related osteoporosis without current pathological fracture: Secondary | ICD-10-CM | POA: Diagnosis not present

## 2019-04-28 DIAGNOSIS — M7542 Impingement syndrome of left shoulder: Secondary | ICD-10-CM | POA: Diagnosis not present

## 2019-04-28 DIAGNOSIS — M7502 Adhesive capsulitis of left shoulder: Secondary | ICD-10-CM | POA: Diagnosis not present

## 2019-05-14 DIAGNOSIS — G43709 Chronic migraine without aura, not intractable, without status migrainosus: Secondary | ICD-10-CM | POA: Diagnosis not present

## 2019-05-21 DIAGNOSIS — M7502 Adhesive capsulitis of left shoulder: Secondary | ICD-10-CM | POA: Diagnosis not present

## 2019-06-01 ENCOUNTER — Encounter: Payer: Self-pay | Admitting: Family Medicine

## 2019-06-01 DIAGNOSIS — I429 Cardiomyopathy, unspecified: Secondary | ICD-10-CM

## 2019-06-01 MED ORDER — PROPRANOLOL HCL 20 MG PO TABS: ORAL_TABLET | ORAL | 5 refills | Status: DC

## 2019-07-02 DIAGNOSIS — G894 Chronic pain syndrome: Secondary | ICD-10-CM | POA: Diagnosis not present

## 2019-07-20 ENCOUNTER — Encounter: Payer: Self-pay | Admitting: Family Medicine

## 2019-07-20 DIAGNOSIS — F988 Other specified behavioral and emotional disorders with onset usually occurring in childhood and adolescence: Secondary | ICD-10-CM

## 2019-07-20 MED ORDER — AMPHETAMINE-DEXTROAMPHETAMINE 10 MG PO TABS: 10.0000 mg | ORAL_TABLET | Freq: Two times a day (BID) | ORAL | 0 refills | Status: DC

## 2019-07-20 NOTE — Telephone Encounter (Signed)
Requesting:Adderall Contract:10/14/2018 UDS:10/10/2018 Last Visit:04/20/2019 Next Visit:none scheduled Last Refill:04/20/2019 3 rxs  Please Advise;l

## 2019-07-27 DIAGNOSIS — Z1152 Encounter for screening for COVID-19: Secondary | ICD-10-CM | POA: Diagnosis not present

## 2019-07-27 DIAGNOSIS — Z1159 Encounter for screening for other viral diseases: Secondary | ICD-10-CM | POA: Diagnosis not present

## 2019-08-11 ENCOUNTER — Emergency Department (INDEPENDENT_AMBULATORY_CARE_PROVIDER_SITE_OTHER): Payer: BC Managed Care – PPO

## 2019-08-11 ENCOUNTER — Emergency Department
Admission: EM | Admit: 2019-08-11 | Discharge: 2019-08-11 | Disposition: A | Discharge status: Home / Self Care | Payer: BC Managed Care – PPO | Source: Home / Self Care

## 2019-08-11 ENCOUNTER — Encounter: Payer: Self-pay | Admitting: Emergency Medicine

## 2019-08-11 ENCOUNTER — Other Ambulatory Visit: Payer: Self-pay

## 2019-08-11 DIAGNOSIS — W010XXA Fall on same level from slipping, tripping and stumbling without subsequent striking against object, initial encounter: Secondary | ICD-10-CM | POA: Diagnosis not present

## 2019-08-11 DIAGNOSIS — S62613A Displaced fracture of proximal phalanx of left middle finger, initial encounter for closed fracture: Secondary | ICD-10-CM | POA: Diagnosis not present

## 2019-08-11 DIAGNOSIS — S62641A Nondisplaced fracture of proximal phalanx of left index finger, initial encounter for closed fracture: Secondary | ICD-10-CM

## 2019-08-11 DIAGNOSIS — R1032 Left lower quadrant pain: Secondary | ICD-10-CM | POA: Diagnosis not present

## 2019-08-11 DIAGNOSIS — M79642 Pain in left hand: Secondary | ICD-10-CM | POA: Diagnosis not present

## 2019-08-11 HISTORY — DX: Diverticulosis of intestine, part unspecified, without perforation or abscess without bleeding: K57.90

## 2019-08-11 LAB — POCT URINALYSIS DIP (MANUAL ENTRY)
Glucose, UA: NEGATIVE mg/dL
Leukocytes, UA: NEGATIVE
Nitrite, UA: NEGATIVE
Protein Ur, POC: NEGATIVE mg/dL
Spec Grav, UA: 1.025 (ref 1.010–1.025)
Urobilinogen, UA: 0.2 E.U./dL
pH, UA: 6.5 (ref 5.0–8.0)

## 2019-08-11 MED ORDER — ONDANSETRON 4 MG PO TBDP: 4.0000 mg | ORAL_TABLET | Freq: Three times a day (TID) | ORAL | 0 refills | Status: DC | PRN

## 2019-08-11 MED ORDER — AMOXICILLIN-POT CLAVULANATE 875-125 MG PO TABS: 1.0000 | ORAL_TABLET | Freq: Two times a day (BID) | ORAL | 0 refills | Status: DC

## 2019-08-11 MED ORDER — HYDROCODONE-ACETAMINOPHEN 5-325 MG PO TABS: 1.0000 | ORAL_TABLET | Freq: Four times a day (QID) | ORAL | 0 refills | Status: DC | PRN

## 2019-08-11 NOTE — Discharge Instructions (Addendum)
If your abdominal pain worsens, then you need to be evaluated in the emergency room to rule out any other intraabdominal problems. Antibiotics, pain meds and nausea meds for symptomatic relief

## 2019-08-11 NOTE — ED Provider Notes (Signed)
Tracy Cook CARE    CSN: [id number] Arrival date & time: 08/11/19  1325      History   Chief Complaint Chief Complaint  Patient presents with  . Finger Injury    left index  . Abdominal Cramping  . Dysuria    HPI Tracy Cook is a 64 y.o. female.   64 year old female, with history of migraines, pots, insomnia, diverticulitis, presenting today with several complaints.  Patient is complaining of pain and swelling to her left index finger.  States that she fell yesterday and landed on her left hand.  Has noticed some bruising as well as decreased range of motion since that time.  Patient is right-hand dominant. Patient is also had 2 days of dysuria.  Believes that she may have a urinary tract infection.  No fever, chills or flank pain. Patient is also complaining of 1 day of left lower quadrant abdominal pain with some nausea.  States that she has known diverticulosis and has some occasional left lower quadrant abdominal pain after eating certain foods.  States that yesterday, she ate some vegetables that were not cooked all the way and then had some vague preservatives.  States that she has had pain since that time with some nausea.  States that this feels the same as her previous episodes of diverticulitis.  No fever, chills or diarrhea.  The history is provided by the patient.  Abdominal Cramping This is a new problem. The current episode started yesterday. The problem occurs constantly. The problem has not changed since onset.Associated symptoms include abdominal pain. Pertinent negatives include no chest pain, no headaches and no shortness of breath. Nothing aggravates the symptoms. Nothing relieves the symptoms. She has tried nothing for the symptoms. The treatment provided no relief.    Past Medical History:  Diagnosis Date  . Cardiomyopathy ([place])   . Diverticulosis   . Insomnia   . Menopause   . Migraine   . Orthostatic hypotension   . Osteoporosis   . Other  malaise and fatigue   . Persistent disorder of initiating or maintaining sleep   . POTS (postural orthostatic tachycardia syndrome)   . Tachycardia, unspecified     Patient Active Problem List   Diagnosis Date Noted  . Attention deficit disorder (ADD) without hyperactivity 04/20/2018  . Preventative health care 11/13/2016  . Chronic migraine without aura without status migrainosus, not intractable 08/19/2015  . Osteoporosis 09/22/2014  . Compression fracture of L3 lumbar vertebra 09/22/2014  . Lumbar spondylosis 03/19/2013  . Conjunctivitis 06/30/2012  . Vaginal dryness, menopausal 06/30/2012  . Exposure to influenza 06/30/2012  . Toe pain 06/25/2012  . Colon polyps 04/17/2012  . Dermatitis 03/31/2012  . Postmenopausal atrophic vaginitis 03/31/2012  . Menopause 01/17/2012  . POTS (postural orthostatic tachycardia syndrome) 01/17/2012  . Congestive dilated cardiomyopathy ([place]) 11/07/2011  . Chest pain 11/07/2011  . UNSPECIFIED TACHYCARDIA 01/12/2009  . Migraine 12/15/2008  . INADEQUATE SLEEP HYGIENE 04/22/2008  . PERSISTENT DISORDER INITIATING/MAINTAINING SLEEP 03/11/2008  . Other malaise and fatigue 11/27/2007  . MVP (mitral valve prolapse) 10/30/2007  . POSTURAL HYPOTENSION 10/30/2007  . HYPERMOBILITY SYNDROME 10/30/2007    History reviewed. No pertinent surgical history.  OB History    Gravida  2   Para  2   Term  2   Preterm      AB      Living  2     SAB      TAB      Ectopic  Multiple      Live Births               Home Medications    Prior to Admission medications   Medication Sig Start Date End Date Taking? Authorizing Provider  amoxicillin-clavulanate (AUGMENTIN) 875-125 MG tablet Take 1 tablet by mouth every 12 (twelve) hours. 08/11/19   Tryston Gilliam C, PA-C  amphetamine-dextroamphetamine (ADDERALL) 10 MG tablet Take 1 tablet (10 mg total) by mouth 2 (two) times daily. 04/20/19   [name], DO    amphetamine-dextroamphetamine (ADDERALL) 10 MG tablet Take 1 tablet (10 mg total) by mouth 2 (two) times daily. 04/20/19   [name], DO  amphetamine-dextroamphetamine (ADDERALL) 10 MG tablet Take 1 tablet (10 mg total) by mouth 2 (two) times daily. 07/20/19   [name], DO  HYDROcodone-acetaminophen (NORCO/VICODIN) 5-325 MG tablet Take 1 tablet by mouth every 6 (six) hours as needed. 08/11/19   Aqib Lough C, PA-C  ondansetron (ZOFRAN ODT) 4 MG disintegrating tablet Take 1 tablet (4 mg total) by mouth every 8 (eight) hours as needed for nausea or vomiting. 08/11/19   Treina Arscott C, PA-C  propranolol (INDERAL) 20 MG tablet TAKE 1 TABLET (20 MG TOTAL) BY MOUTH 3 TIMES DAILY. 06/01/19   [name], DO  SUMAtriptan (IMITREX) 100 MG tablet SEE NOTES 01/06/18   Pieter Partridge, DO  tiZANidine (ZANAFLEX) 4 MG tablet Take 4 mg by mouth as needed.  05/02/15   [provider]  topiramate (TOPAMAX) 50 MG tablet Take 2 tablets (100 mg total) by mouth at bedtime. 12/30/17   Pieter Partridge, DO    Family History Family History  Problem Relation Age of Onset  . Coronary artery disease Father        Died of MI at age 60  . Asthma Father   . Hypertension Father   . Sudden death Father   . Heart attack Father   . Stroke Mother   . Diabetes Neg Hx   . Hyperlipidemia Neg Hx     Social History Social History   Tobacco Use  . Smoking status: Never Smoker  . Smokeless tobacco: Never Used  Substance Use Topics  . Alcohol use: No  . Drug use: No     Allergies   Forteo [parathyroid hormone (recomb)], Fosamax [alendronate sodium], Vicodin [hydrocodone-acetaminophen], and Codeine   Review of Systems Review of Systems  Constitutional: Negative for chills and fever.  HENT: Negative for ear pain and sore throat.   Eyes: Negative for pain and visual disturbance.  Respiratory: Negative for cough and shortness of breath.   Cardiovascular: Negative for chest pain and  palpitations.  Gastrointestinal: Positive for abdominal pain and nausea. Negative for vomiting.  Genitourinary: Positive for dysuria. Negative for hematuria.  Musculoskeletal: Positive for joint swelling. Negative for arthralgias and back pain.  Skin: Negative for color change and rash.  Neurological: Negative for seizures, syncope and headaches.  All other systems reviewed and are negative.    Physical Exam Triage Vital Signs ED Triage Vitals  Enc Vitals Group     BP 08/11/19 1419 (!) 146/93     Pulse Rate 08/11/19 1419 84     Resp 08/11/19 1419 18     Temp 08/11/19 1419 98 F (36.7 C)     Temp Source 08/11/19 1419 Oral     SpO2 08/11/19 1419 98 %     Weight 08/11/19 1420 125 lb 10.6 oz (57 kg)  Height 08/11/19 1420 5\' 10"  (1.778 m)     Head Circumference --      Peak Flow --      Pain Score 08/11/19 1419 5     Pain Loc --      Pain Edu? --      Excl. in [place]? --    No data found.  Updated Vital Signs BP (!) 146/93 (BP Location: Right Arm)   Pulse 84   Temp 98 F (36.7 C) (Oral)   Resp 18   Ht 5\' 10"  (1.778 m)   Wt 125 lb 10.6 oz (57 kg)   SpO2 98%   BMI 18.03 kg/m   Visual Acuity Right Eye Distance:   Left Eye Distance:   Bilateral Distance:    Right Eye Near:   Left Eye Near:    Bilateral Near:     Physical Exam Vitals and nursing note reviewed.  Constitutional:      General: She is not in acute distress.    Appearance: She is well-developed.  HENT:     Head: Normocephalic and atraumatic.  Eyes:     Conjunctiva/sclera: Conjunctivae normal.  Cardiovascular:     Rate and Rhythm: Normal rate and regular rhythm.     Heart sounds: No murmur.  Pulmonary:     Effort: Pulmonary effort is normal. No respiratory distress.     Breath sounds: Normal breath sounds.  Abdominal:     Palpations: Abdomen is soft.     Tenderness: There is abdominal tenderness in the left lower quadrant. There is no right CVA tenderness or left CVA tenderness.     Comments:  Tenderness to palpation of the left lower quadrant without rebound or guarding  Musculoskeletal:     Left hand: Tenderness present. Decreased range of motion.       Arms:     Cervical back: Neck supple.     Comments: Edema and ecchymosis noted to the left index finger.  Patient has range of motion at the MCP joint but is unable to bend at the PIP or DIP.  Neurovascularly intact.  Skin:    General: Skin is warm and dry.  Neurological:     Mental Status: She is alert.      UC Treatments / Results  Labs (all labs ordered are listed, but only abnormal results are displayed) Labs Reviewed  POCT URINALYSIS DIP (MANUAL ENTRY) - Abnormal; Notable for the following components:      Result Value   Clarity, UA cloudy (*)    Bilirubin, UA small (*)    Ketones, POC UA small (15) (*)    Blood, UA trace-intact (*)    All other components within normal limits  URINE CULTURE    EKG   Radiology DG Hand Complete Left  Result Date: 08/11/2019 CLINICAL DATA:  Recent trip and fall 3 days ago with left hand pain, initial encounter EXAM: LEFT HAND - COMPLETE 3+ VIEW COMPARISON:  None. FINDINGS: There is a fracture at the base of the second middle phalanx extending into the articular surface with some impaction at the PIP joint. No other fractures are seen. No other focal abnormality is noted. IMPRESSION: Fracture at the base of the second middle phalanx. Electronically Signed   By: Inez Catalina M.D.   On: 08/11/2019 14:48    Procedures Procedures (including critical care time)  Medications Ordered in UC Medications - No data to display  Initial Impression / Assessment and Plan / UC Course  I  have reviewed the triage vital signs and the nursing notes.  Pertinent labs & imaging results that were available during my care of the patient were reviewed by me and considered in my medical decision making (see chart for details).     No evidence of urinary tract infection.  Patient states that she  did take 2 tablets of amoxicillin that she had leftover yesterday.  Patient does have some tenderness of the left lower quadrant but no signs of an acute abdomen.  Will treat with Augmentin to cover both her urine and possible diverticulitis.  She was given strict return precautions and understands the importance of following up if her pain worsens. X-ray does show a fracture of the base of the second middle phalanx.  She was placed in a finger splint and given pain medication.  Rest, ice and elevate. Final Clinical Impressions(s) / UC Diagnoses   Final diagnoses:  Left lower quadrant abdominal pain  Closed nondisplaced fracture of proximal phalanx of left index finger, initial encounter     Discharge Instructions     If your abdominal pain worsens, then you need to be evaluated in the emergency room to rule out any other intraabdominal problems. Antibiotics, pain meds and nausea meds for symptomatic relief     ED Prescriptions    Medication Sig Dispense Auth. Provider   amoxicillin-clavulanate (AUGMENTIN) 875-125 MG tablet Take 1 tablet by mouth every 12 (twelve) hours. 14 tablet Timber Lucarelli C, PA-C   HYDROcodone-acetaminophen (NORCO/VICODIN) 5-325 MG tablet Take 1 tablet by mouth every 6 (six) hours as needed. 12 tablet Alyia Lacerte C, PA-C   ondansetron (ZOFRAN ODT) 4 MG disintegrating tablet Take 1 tablet (4 mg total) by mouth every 8 (eight) hours as needed for nausea or vomiting. 20 tablet Katalin Colledge C, PA-C     I have reviewed the PDMP during this encounter.   Phebe Colla, [place] 08/11/19 1502

## 2019-08-11 NOTE — ED Triage Notes (Signed)
Returned from [place] 4 days ago; fell and injured left index finger while there; past 2 days abdominal cramping which she thinks is diverticulitis flare; also dysuria. Took zofran for nausea on way to [facility] just now.

## 2019-08-13 LAB — URINE CULTURE
MICRO NUMBER:: 10057836
SPECIMEN QUALITY:: ADEQUATE

## 2019-08-20 ENCOUNTER — Encounter: Payer: Self-pay | Admitting: Family Medicine

## 2019-08-20 ENCOUNTER — Other Ambulatory Visit: Payer: Self-pay | Admitting: Family Medicine

## 2019-08-20 DIAGNOSIS — F988 Other specified behavioral and emotional disorders with onset usually occurring in childhood and adolescence: Secondary | ICD-10-CM

## 2019-08-20 MED ORDER — AMPHETAMINE-DEXTROAMPHETAMINE 10 MG PO TABS: 10.0000 mg | ORAL_TABLET | Freq: Two times a day (BID) | ORAL | 0 refills | Status: DC

## 2019-08-20 NOTE — Telephone Encounter (Signed)
Requesting: Adderall Contract: 10/10/2018 UDS: 10/10/2018, low risk Last OV: 04/20/2019 Next OV: N/A Last Refill: 07/20/2019, #60--0 RF Database:   Please advise

## 2019-08-20 NOTE — Telephone Encounter (Signed)
I month sent in Pt due for ov in march

## 2019-09-14 NOTE — Progress Notes (Signed)
Referring-Yvonne Lowne Chase DO Reason for referral-cardiomyopathy and chest pain  HPI: 64 year old female for evaluation of cardiomyopathy and chest pain at request of Roma Schanz DO.  Previously seen but not since January 2018. H/O cardiomyopathy and POTS. Stress echocardiogram performed in [facility] in February of 2013 showed an ejection fraction of 45% at rest that improves to 70% with exercise. MUGA 4/13 showed EF 60. Echocardiogram repeated February 2018 and showed ejection fraction 50 to [id number], grade 1 diastolic dysfunction.  Exercise treadmill February 2018 normal.  Patient complains of intermittent chest pain.  It is in the left chest area and described as a sharp pain lasting for approximately 1 minute.  Occurs with walking long distances.  No radiation.  Not pleuritic and resolve spontaneously.  No associated symptoms.  She denies dyspnea on exertion, orthopnea, PND, pedal edema or syncope.  Cardiology now asked to evaluate.  Current Outpatient Medications  Medication Sig Dispense Refill  . amphetamine-dextroamphetamine (ADDERALL) 10 MG tablet Take 1 tablet (10 mg total) by mouth 2 (two) times daily. 60 tablet 0  . ondansetron (ZOFRAN ODT) 4 MG disintegrating tablet Take 1 tablet (4 mg total) by mouth every 8 (eight) hours as needed for nausea or vomiting. 20 tablet 0  . propranolol (INDERAL) 20 MG tablet TAKE 1 TABLET (20 MG TOTAL) BY MOUTH 3 TIMES DAILY. 90 tablet 5  . SUMAtriptan (IMITREX) 100 MG tablet SEE NOTES 12 tablet 1  . tiZANidine (ZANAFLEX) 4 MG tablet Take 4 mg by mouth as needed.     . topiramate (TOPAMAX) 50 MG tablet Take 2 tablets (100 mg total) by mouth at bedtime. 60 tablet 5   No current facility-administered medications for this visit.    Allergies  Allergen Reactions  . Alendronate Sodium Rash  . Codeine Nausea And Vomiting  . Hydrocodone-Acetaminophen Nausea And Vomiting  . Parathyroid Hormone (Recomb) Other (See Comments)    Bone pain and  headaches Bone pain and headaches  . Vicodin [Hydrocodone-Acetaminophen] Nausea And Vomiting     Past Medical History:  Diagnosis Date  . Cardiomyopathy ([place])   . Diverticulosis   . Insomnia   . Menopause   . Migraine   . Orthostatic hypotension   . Osteoporosis   . Other malaise and fatigue   . Persistent disorder of initiating or maintaining sleep   . POTS (postural orthostatic tachycardia syndrome)   . Tachycardia, unspecified     Past Surgical History:  Procedure Laterality Date  . No prior surgery      Social History   Socioeconomic History  . Marital status: Married    Spouse name: Not on file  . Number of children: 2  . Years of education: Not on file  . Highest education level: Not on file  Occupational History    Comment: Medial lab technologist  Tobacco Use  . Smoking status: Never Smoker  . Smokeless tobacco: Never Used  Substance and Sexual Activity  . Alcohol use: No  . Drug use: No  . Sexual activity: Yes    Partners: Male  Other Topics Concern  . Not on file  Social History Narrative   Exercise almost every day   Social Determinants of Health   Financial Resource Strain:   . Difficulty of Paying Living Expenses: Not on file  Food Insecurity:   . Worried About Charity fundraiser in the Last Year: Not on file  . Ran Out of Food in the Last Year: Not on file  Transportation Needs:   . Film/video editor (Medical): Not on file  . Lack of Transportation (Non-Medical): Not on file  Physical Activity:   . Days of Exercise per Week: Not on file  . Minutes of Exercise per Session: Not on file  Stress:   . Feeling of Stress : Not on file  Social Connections:   . Frequency of Communication with Friends and Family: Not on file  . Frequency of Social Gatherings with Friends and Family: Not on file  . Attends Religious Services: Not on file  . Active Member of Clubs or Organizations: Not on file  . Attends Archivist Meetings: Not  on file  . Marital Status: Not on file  Intimate Partner Violence:   . Fear of Current or Ex-Partner: Not on file  . Emotionally Abused: Not on file  . Physically Abused: Not on file  . Sexually Abused: Not on file    Family History  Problem Relation Age of Onset  . Coronary artery disease Father        Died of MI at age 42  . Asthma Father   . Hypertension Father   . Sudden death Father   . Heart attack Father   . Stroke Mother   . Diabetes Neg Hx   . Hyperlipidemia Neg Hx     ROS: no fevers or chills, productive cough, hemoptysis, dysphasia, odynophagia, melena, hematochezia, dysuria, hematuria, rash, seizure activity, orthopnea, PND, pedal edema, claudication. Remaining systems are negative.  Physical Exam:   Blood pressure 108/64, pulse 83, height 5' 9.5" (1.765 m), weight 123 lb 1.9 oz (55.8 kg).  General:  Well developed/well nourished in NAD Skin warm/dry Patient not depressed No peripheral clubbing Back-normal HEENT-normal/normal eyelids Neck supple/normal carotid upstroke bilaterally; no bruits; no JVD; no thyromegaly chest - CTA/ normal expansion CV - RRR/normal S1 and S2; no murmurs, rubs or gallops;  PMI nondisplaced Abdomen -NT/ND, no HSM, no mass, + bowel sounds, no bruit 2+ femoral pulses, no bruits Ext-no edema, chords, 2+ DP Neuro-grossly nonfocal  ECG -sinus rhythm at a rate of 83, no ST changes.  Personally reviewed  A/P  1 history of cardiomyopathy-LV function improved on most recent echocardiogram.  Will repeat.  We will continue beta-blocker for now.  2 chest pain-symptoms are somewhat atypical.  However she has a strong family history of coronary disease with her father having myocardial infarction at age 1.  She also states that her sister recently had a significantly elevated calcium score.  I will arrange a CTA to further assess.  3 history of POTS-continue beta-blocker.  Symptoms well controlled.  Kirk Ruths, MD

## 2019-09-16 ENCOUNTER — Ambulatory Visit: Payer: BC Managed Care – PPO | Admitting: Cardiology

## 2019-09-16 ENCOUNTER — Encounter: Payer: Self-pay | Admitting: Cardiology

## 2019-09-16 ENCOUNTER — Other Ambulatory Visit: Payer: Self-pay

## 2019-09-16 VITALS — BP 108/64 | HR 83 | Ht 69.5 in | Wt 123.1 lb

## 2019-09-16 DIAGNOSIS — R072 Precordial pain: Secondary | ICD-10-CM | POA: Diagnosis not present

## 2019-09-16 DIAGNOSIS — G90A Postural orthostatic tachycardia syndrome (POTS): Secondary | ICD-10-CM

## 2019-09-16 DIAGNOSIS — I420 Dilated cardiomyopathy: Secondary | ICD-10-CM

## 2019-09-16 DIAGNOSIS — I498 Other specified cardiac arrhythmias: Secondary | ICD-10-CM | POA: Diagnosis not present

## 2019-09-16 DIAGNOSIS — R0602 Shortness of breath: Secondary | ICD-10-CM

## 2019-09-16 MED ORDER — METOPROLOL TARTRATE 100 MG PO TABS: ORAL_TABLET | ORAL | 0 refills | Status: DC

## 2019-09-16 NOTE — Patient Instructions (Signed)
Medication Instructions:  NO CHANGE *If you need a refill on your cardiac medications before your next appointment, please call your pharmacy*  Lab Work: If you have labs (blood work) drawn today and your tests are completely normal, you will receive your results only by: Marland Kitchen MyChart Message (if you have MyChart) OR . A paper copy in the mail If you have any lab test that is abnormal or we need to change your treatment, we will call you to review the results.  Testing/Procedures: Your physician has requested that you have an echocardiogram. Echocardiography is a painless test that uses sound waves to create images of your heart. It provides your doctor with information about the size and shape of your heart and how well your heart's chambers and valves are working. This procedure takes approximately one hour. There are no restrictions for this procedure.IMAGING DEPARTMENT IN THE [place] OFFICE  Your cardiac CT will be scheduled at one of the below locations:   [facility] [address] [place], [place] [address] [phone]  [facility] [address] [place], [place] [address] [phone]  If scheduled at [facility], please arrive at the [facility] main entrance of [facility] 30 minutes prior to test start time. Proceed to the [facility] Radiology Department (first floor) to check-in and test prep.  If scheduled at [facility], please arrive 15 mins early for check-in and test prep.  Please follow these instructions carefully (unless otherwise directed):  Hold all erectile dysfunction medications at least 3 days (72 hrs) prior to test.  On the Night Before the Test: . Be sure to Drink plenty of water. . Do not consume any caffeinated/decaffeinated beverages or chocolate 12 hours prior to your test. . Do not take any antihistamines 12 hours prior to your test. . If  you take Metformin do not take 24 hours prior to test.  On the Day of the Test: . Drink plenty of water. Do not drink any water within one hour of the test. . Do not eat any food 4 hours prior to the test. . You may take your regular medications prior to the test.  . Take metoprolol (Lopressor) 100 MG two hours prior to test. . HOLD Furosemide/Hydrochlorothiazide morning of the test. . FEMALES- please wear underwire-free bra if available       After the Test: . Drink plenty of water. . After receiving IV contrast, you may experience a mild flushed feeling. This is normal. . On occasion, you may experience a mild rash up to 24 hours after the test. This is not dangerous. If this occurs, you can take Benadryl 25 mg and increase your fluid intake. . If you experience trouble breathing, this can be serious. If it is severe call 911 IMMEDIATELY. If it is mild, please call our office. . If you take any of these medications: Glipizide/Metformin, Avandament, Glucavance, please do not take 48 hours after completing test unless otherwise instructed.   Once we have confirmed authorization from your insurance company, we will call you to set up a date and time for your test.   For non-scheduling related questions, please contact the cardiac imaging nurse navigator should you have any questions/concerns: Marchia Bond, RN Navigator Cardiac Imaging [facility] [phone] mobile      Follow-Up: At [facility], you and your health needs are our priority.  As part of our  continuing mission to provide you with exceptional heart care, we have created designated Provider Care Teams.  These Care Teams include your primary Cardiologist (physician) and Advanced Practice Providers (APPs -  Physician Assistants and Nurse Practitioners) who all work together to provide you with the care you need, when you need it.  Your next appointment:   12 month(s)  The format for your next  appointment:   Either In Person or Virtual  Provider:   Kirk Ruths MD

## 2019-09-18 DIAGNOSIS — M25571 Pain in right ankle and joints of right foot: Secondary | ICD-10-CM | POA: Diagnosis not present

## 2019-09-18 DIAGNOSIS — M79645 Pain in left finger(s): Secondary | ICD-10-CM | POA: Diagnosis not present

## 2019-09-24 DIAGNOSIS — Y999 Unspecified external cause status: Secondary | ICD-10-CM | POA: Diagnosis not present

## 2019-09-24 DIAGNOSIS — S62621A Displaced fracture of medial phalanx of left index finger, initial encounter for closed fracture: Secondary | ICD-10-CM | POA: Diagnosis not present

## 2019-09-24 DIAGNOSIS — X58XXXA Exposure to other specified factors, initial encounter: Secondary | ICD-10-CM | POA: Diagnosis not present

## 2019-09-28 ENCOUNTER — Other Ambulatory Visit: Payer: Self-pay

## 2019-09-28 ENCOUNTER — Ambulatory Visit (HOSPITAL_BASED_OUTPATIENT_CLINIC_OR_DEPARTMENT_OTHER)
Admission: RE | Admit: 2019-09-28 | Discharge: 2019-09-28 | Disposition: A | Discharge status: Home / Self Care | Payer: BC Managed Care – PPO | Source: Ambulatory Visit | Attending: Cardiology | Admitting: Cardiology

## 2019-09-28 DIAGNOSIS — R0602 Shortness of breath: Secondary | ICD-10-CM | POA: Diagnosis not present

## 2019-09-28 DIAGNOSIS — R072 Precordial pain: Secondary | ICD-10-CM | POA: Diagnosis not present

## 2019-09-28 NOTE — Progress Notes (Signed)
  Echocardiogram 2D Echocardiogram has been performed.  Tracy Cook 09/28/2019, 10:50 AM

## 2019-10-01 ENCOUNTER — Other Ambulatory Visit: Payer: Self-pay | Admitting: *Deleted

## 2019-10-01 DIAGNOSIS — R072 Precordial pain: Secondary | ICD-10-CM

## 2019-10-06 DIAGNOSIS — S62621A Displaced fracture of medial phalanx of left index finger, initial encounter for closed fracture: Secondary | ICD-10-CM | POA: Diagnosis not present

## 2019-10-06 DIAGNOSIS — M25642 Stiffness of left hand, not elsewhere classified: Secondary | ICD-10-CM | POA: Diagnosis not present

## 2019-10-06 LAB — BASIC METABOLIC PANEL
BUN/Creatinine Ratio: 22 (ref 12–28)
BUN: 20 mg/dL (ref 8–27)
CO2: 19 mmol/L — ABNORMAL LOW (ref 20–29)
Calcium: 9.8 mg/dL (ref 8.7–10.3)
Chloride: 110 mmol/L — ABNORMAL HIGH (ref 96–106)
Creatinine, Ser: 0.93 mg/dL (ref 0.57–1.00)
GFR calc Af Amer: 76 mL/min/{1.73_m2} (ref >59)
GFR calc non Af Amer: 66 mL/min/{1.73_m2} (ref >59)
Glucose: 96 mg/dL (ref 65–99)
Potassium: 4.5 mmol/L (ref 3.5–5.2)
Sodium: 143 mmol/L (ref 134–144)

## 2019-10-12 DIAGNOSIS — M25642 Stiffness of left hand, not elsewhere classified: Secondary | ICD-10-CM | POA: Diagnosis not present

## 2019-10-14 DIAGNOSIS — G894 Chronic pain syndrome: Secondary | ICD-10-CM | POA: Diagnosis not present

## 2019-10-19 ENCOUNTER — Telehealth (HOSPITAL_COMMUNITY): Payer: Self-pay | Admitting: Emergency Medicine

## 2019-10-19 ENCOUNTER — Encounter (HOSPITAL_COMMUNITY): Payer: Self-pay

## 2019-10-19 NOTE — Telephone Encounter (Signed)
Attempted to call patient regarding upcoming cardiac CT appointment. °Left message on voicemail with name and callback number °Aijah Lattner RN Navigator Cardiac Imaging °[facility] °[phone] Office °[phone] Cell ° °

## 2019-10-20 ENCOUNTER — Ambulatory Visit (HOSPITAL_COMMUNITY)
Admission: RE | Admit: 2019-10-20 | Discharge: 2019-10-20 | Disposition: A | Discharge status: Home / Self Care | Payer: BC Managed Care – PPO | Source: Ambulatory Visit | Attending: Cardiology | Admitting: Cardiology

## 2019-10-20 ENCOUNTER — Other Ambulatory Visit: Payer: Self-pay

## 2019-10-20 DIAGNOSIS — R0602 Shortness of breath: Secondary | ICD-10-CM | POA: Diagnosis not present

## 2019-10-20 DIAGNOSIS — Z006 Encounter for examination for normal comparison and control in clinical research program: Secondary | ICD-10-CM

## 2019-10-20 DIAGNOSIS — R072 Precordial pain: Secondary | ICD-10-CM | POA: Diagnosis not present

## 2019-10-20 MED ORDER — NITROGLYCERIN 0.4 MG SL SUBL
SUBLINGUAL_TABLET | SUBLINGUAL | Status: AC
  Administered 2019-10-20: 0.8 mg via SUBLINGUAL
  Filled 2019-10-20: qty 2

## 2019-10-20 MED ORDER — METOPROLOL TARTRATE 5 MG/5ML IV SOLN
INTRAVENOUS | Status: AC
  Administered 2019-10-20: 5 mg via INTRAVENOUS
  Filled 2019-10-20: qty 5

## 2019-10-20 MED ORDER — METOPROLOL TARTRATE 5 MG/5ML IV SOLN: 5.0000 mg | INTRAVENOUS | Status: DC | PRN

## 2019-10-20 MED ORDER — IOHEXOL 350 MG/ML SOLN
80.0000 mL | Freq: Once | INTRAVENOUS | Status: AC | PRN
  Administered 2019-10-20: 80 mL via INTRAVENOUS

## 2019-10-20 MED ORDER — NITROGLYCERIN 0.4 MG SL SUBL: 0.8000 mg | SUBLINGUAL_TABLET | Freq: Once | SUBLINGUAL | Status: AC

## 2019-10-20 NOTE — Research (Signed)
Cadfem Informed Consent    Patient Name: Tracy Cook   Subject met inclusion and exclusion criteria.  The informed consent form, study requirements and expectations were reviewed with the subject and questions and concerns were addressed prior to the signing of the consent form.  The subject verbalized understanding of the trail requirements.  The subject agreed to participate in the CADFEM trial and signed the informed consent.  The informed consent was obtained prior to performance of any protocol-specific procedures for the subject.  A copy of the signed informed consent was given to the subject and a copy was placed in the subject's medical record.   Neva Seat

## 2019-10-21 ENCOUNTER — Ambulatory Visit (HOSPITAL_COMMUNITY)
Admission: RE | Admit: 2019-10-21 | Discharge: 2019-10-21 | Disposition: A | Discharge status: Home / Self Care | Payer: BC Managed Care – PPO | Source: Ambulatory Visit | Attending: Cardiology | Admitting: Cardiology

## 2019-10-21 ENCOUNTER — Telehealth: Payer: Self-pay | Admitting: *Deleted

## 2019-10-21 DIAGNOSIS — R0602 Shortness of breath: Secondary | ICD-10-CM

## 2019-10-21 DIAGNOSIS — R072 Precordial pain: Secondary | ICD-10-CM | POA: Diagnosis not present

## 2019-10-21 MED ORDER — ASPIRIN EC 81 MG PO TBEC: 81.0000 mg | DELAYED_RELEASE_TABLET | Freq: Every day | ORAL | 3 refills | Status: DC

## 2019-10-21 NOTE — Telephone Encounter (Signed)
Spoke with pt, Aware of dr Jacalyn Lefevre recommendations. She will think about taking the crestor and let me know if wants to do that. She will start a baby aspirin daily.

## 2019-10-21 NOTE — Telephone Encounter (Addendum)
Left message for pt to call    ----- Message from Lelon Perla, MD sent at 10/21/2019  7:40 AM EDT ----- Add asa 81 mg daily and crestor 40 mg daily; lipids and liver 12 weeks Kirk Ruths

## 2019-10-21 NOTE — Telephone Encounter (Signed)
Patient returning Debra's call.  

## 2019-10-30 DIAGNOSIS — M5136 Other intervertebral disc degeneration, lumbar region: Secondary | ICD-10-CM | POA: Diagnosis not present

## 2019-11-04 ENCOUNTER — Other Ambulatory Visit: Payer: Self-pay | Admitting: Chiropractic Medicine

## 2019-11-04 DIAGNOSIS — S32000A Wedge compression fracture of unspecified lumbar vertebra, initial encounter for closed fracture: Secondary | ICD-10-CM

## 2019-11-05 ENCOUNTER — Other Ambulatory Visit: Payer: Self-pay

## 2019-11-05 ENCOUNTER — Ambulatory Visit
Admission: RE | Admit: 2019-11-05 | Discharge: 2019-11-05 | Disposition: A | Discharge status: Home / Self Care | Payer: Self-pay | Source: Ambulatory Visit | Attending: Chiropractic Medicine | Admitting: Chiropractic Medicine

## 2019-11-05 ENCOUNTER — Encounter: Payer: Self-pay | Admitting: *Deleted

## 2019-11-05 ENCOUNTER — Ambulatory Visit
Admission: RE | Admit: 2019-11-05 | Discharge: 2019-11-05 | Disposition: A | Discharge status: Home / Self Care | Payer: BC Managed Care – PPO | Source: Ambulatory Visit | Attending: Chiropractic Medicine | Admitting: Chiropractic Medicine

## 2019-11-05 ENCOUNTER — Other Ambulatory Visit: Payer: Self-pay | Admitting: Chiropractic Medicine

## 2019-11-05 DIAGNOSIS — S32000A Wedge compression fracture of unspecified lumbar vertebra, initial encounter for closed fracture: Secondary | ICD-10-CM

## 2019-11-05 DIAGNOSIS — M545 Low back pain, unspecified: Secondary | ICD-10-CM

## 2019-11-05 DIAGNOSIS — S32020A Wedge compression fracture of second lumbar vertebra, initial encounter for closed fracture: Secondary | ICD-10-CM | POA: Diagnosis not present

## 2019-11-05 HISTORY — PX: IR RADIOLOGIST EVAL & MGMT: IMG5224

## 2019-11-05 NOTE — Consult Note (Signed)
Chief Complaint: Patient was seen in consultation today for painful subacute L2 compression fracture at the request of Knowles,Carol  Referring Physician(s): Knowles,Carol  History of Present Illness: Tracy Cook is a 64 y.o. female with a history of osteoporosis and previous thoracic and lumbar compression fractures, status post cement augmentation at L4 and L5 approximately 2 years ago.  She is on calcium and vitamin D supplementation.  She was moving some boxes as part of the home move and noted development of acute lumbar pain approximately 1 week ago.  This persists in her lower lumbar region across the midline, slightly greater left than right.  No lower extremity component.  No bowel or bladder control issues.  She rates her pain 9 out of 10 on the visual analog pain scale.  She was given Percocet for pain control.  At best this reduces the pain to 7 out of 10.  Use of the Percocet is complicated by nausea requiring administration of concurrent Zofran.  Past Medical History:  Diagnosis Date  . Cardiomyopathy ([place])   . Diverticulosis   . Insomnia   . Menopause   . Migraine   . Orthostatic hypotension   . Osteoporosis   . Other malaise and fatigue   . Persistent disorder of initiating or maintaining sleep   . POTS (postural orthostatic tachycardia syndrome)   . Tachycardia, unspecified     Past Surgical History:  Procedure Laterality Date  . IR RADIOLOGIST EVAL & MGMT  11/05/2019  . No prior surgery      Allergies: Alendronate sodium, Codeine, Hydrocodone-acetaminophen, Parathyroid hormone (recomb), and Vicodin [hydrocodone-acetaminophen]  Medications: Prior to Admission medications   Medication Sig Start Date End Date Taking? Authorizing Provider  amphetamine-dextroamphetamine (ADDERALL) 10 MG tablet Take 1 tablet (10 mg total) by mouth 2 (two) times daily. Patient not taking: Reported on 10/20/2019 08/20/19   Carollee Herter, Alferd Apa, DO  aspirin EC 81 MG  tablet Take 1 tablet (81 mg total) by mouth daily. 10/21/19   Lelon Perla, MD  metoprolol tartrate (LOPRESSOR) 100 MG tablet TAKE 2 HOURS PRIOR TO CT SCAN 09/16/19   Lelon Perla, MD  ondansetron (ZOFRAN ODT) 4 MG disintegrating tablet Take 1 tablet (4 mg total) by mouth every 8 (eight) hours as needed for nausea or vomiting. 08/11/19   Blue, Olivia C, PA-C  propranolol (INDERAL) 20 MG tablet TAKE 1 TABLET (20 MG TOTAL) BY MOUTH 3 TIMES DAILY. 06/01/19   [name], DO  SUMAtriptan (IMITREX) 100 MG tablet SEE NOTES 01/06/18   Pieter Partridge, DO  tiZANidine (ZANAFLEX) 4 MG tablet Take 4 mg by mouth as needed.  05/02/15   [provider]  topiramate (TOPAMAX) 50 MG tablet Take 2 tablets (100 mg total) by mouth at bedtime. 12/30/17   Pieter Partridge, DO     Family History  Problem Relation Age of Onset  . Coronary artery disease Father        Died of MI at age 22  . Asthma Father   . Hypertension Father   . Sudden death Father   . Heart attack Father   . Stroke Mother   . Diabetes Neg Hx   . Hyperlipidemia Neg Hx     Social History   Socioeconomic History  . Marital status: Married    Spouse name: Not on file  . Number of children: 2  . Years of education: Not on file  . Highest education level: Not on  file  Occupational History    Comment: Medial lab technologist  Tobacco Use  . Smoking status: Never Smoker  . Smokeless tobacco: Never Used  Substance and Sexual Activity  . Alcohol use: No  . Drug use: No  . Sexual activity: Yes    Partners: Male  Other Topics Concern  . Not on file  Social History Narrative   Exercise almost every day   Social Determinants of Health   Financial Resource Strain:   . Difficulty of Paying Living Expenses:   Food Insecurity:   . Worried About Charity fundraiser in the Last Year:   . Arboriculturist in the Last Year:   Transportation Needs:   . Film/video editor (Medical):   Marland Kitchen Lack of Transportation  (Non-Medical):   Physical Activity:   . Days of Exercise per Week:   . Minutes of Exercise per Session:   Stress:   . Feeling of Stress :   Social Connections:   . Frequency of Communication with Friends and Family:   . Frequency of Social Gatherings with Friends and Family:   . Attends Religious Services:   . Active Member of Clubs or Organizations:   . Attends Archivist Meetings:   Marland Kitchen Marital Status:     ECOG Status: 1 - Symptomatic but completely ambulatory    Vital Signs: There were no vitals taken for this visit.  Physical Exam Constitutional: Oriented to person, place, and time. Well-developed and well-nourished. No distress.   HENT:  Head: Normocephalic and atraumatic.  Eyes: Conjunctivae and EOM are normal. Right eye exhibits no discharge. Left eye exhibits no discharge. No scleral icterus.  Neck: No JVD present.  Pulmonary/Chest: Effort normal. No stridor. No respiratory distress.  Abdomen: soft, non distended Neurological:  alert and oriented to person, place, and time.  She is able to rise from a sitting position and ambulate normally. Skin: Skin is warm and dry.  not diaphoretic.  Psychiatric:   normal mood and affect.   behavior is normal. Judgment and thought content normal.  Review of Systems Review of Systems: A 12 point ROS discussed and pertinent positives are indicated in the HPI above.  All other systems are negative.  Mallampati Score:     Imaging: CT CORONARY MORPH W/CTA COR W/SCORE W/CA W/CM &/OR WO/CM  Addendum Date: 10/20/2019   ADDENDUM REPORT: 10/20/2019 21:02 CLINICAL DATA:  Chest pain EXAM: Cardiac/Coronary CTA TECHNIQUE: The patient was scanned on a [employer]. A 100 kV prospective scan was triggered in the descending thoracic aorta at 111 HU's. Axial non-contrast 3 mm slices were carried out through the heart. The data set was analyzed on a dedicated work station and scored using the [place]. Gantry rotation  speed was 250 msecs and collimation was .6 mm. 20 mg IV metoprolol and 0.8 mg of sl NTG was given. The 3D data set was reconstructed in 5% intervals of the 35-75 % of the R-R cycle. Diastolic phases were analyzed on a dedicated work station using MPR, MIP and VRT modes. The patient received 80 cc of contrast. FINDINGS: Image quality: excellent. Noise artifact is: Limited. Coronary Arteries:  Normal coronary origin.  Left dominance. Left main: The left main is a large caliber vessel with a normal take off from the left coronary cusp that bifurcates to form a left anterior descending artery and a left circumflex artery. There is no plaque or stenosis. Left anterior descending artery: The proximal LAD contains moderate  mixed density plaque (50-69%). The mid and distal LAD segments are patent. The LAD gives off 1 patent diagonal branch. Left circumflex artery: The LCX is dominant. There is minimal non-calcified plaque in the distal LCX (<25%). The LCX gives off 2 patent obtuse marginal branches. The LCX terminates as a PDA without evidence of plaque or stenosis. Right coronary artery: The RCA is non-dominant with normal take off from the right coronary cusp. There is no evidence of plaque or stenosis. Right Atrium: Right atrial size is within normal limits. Right Ventricle: The right ventricular cavity is within normal limits. Left Atrium: Left atrial size is normal in size with no left atrial appendage filling defect. Left Ventricle: The ventricular cavity size is within normal limits. There are no stigmata of prior infarction. There is no abnormal filling defect. Pulmonary arteries: Normal in size without proximal filling defect. Pulmonary veins: Normal pulmonary venous drainage. Pericardium: Normal thickness with no significant effusion or calcium present. Cardiac valves: The aortic valve is trileaflet without significant calcification. The mitral valve is normal structure without significant calcification. Aorta:  Aortic root measures up to 35 mm in double oblique. Normal caliber with no significant disease. Extra-cardiac findings: See attached radiology report for non-cardiac structures. IMPRESSION: 1. Coronary calcium score of 11. This was 67th percentile for age and sex matched controls. 2. Normal coronary origin with left dominance. 3. Moderate mixed density plaque in the proximal LAD (50-69%). RECOMMENDATIONS: 1. Moderate stenosis in the proximal LAD. Consider symptom-guided anti-ischemic pharmacotherapy as well as risk factor modification per guideline directed care. Additional analysis with CT FFR will be submitted. Eleonore Chiquito, MD Electronically Signed   By: Eleonore Chiquito   On: 10/20/2019 21:02   Result Date: 10/20/2019 EXAM: OVER-READ INTERPRETATION  CT CHEST The following report is an over-read performed by radiologist Dr. Abigail Miyamoto of [facility], [place] on 10/20/2019. This over-read does not include interpretation of cardiac or coronary anatomy or pathology. The coronary CTA interpretation by the cardiologist is attached. COMPARISON:  None. FINDINGS: Vascular: Tortuous thoracic aorta. Aortic atherosclerosis. On No central pulmonary embolism, on this non-dedicated study. Mediastinum/Nodes: No imaged thoracic adenopathy. Esophageal fluid level on 18/11. Lungs/Pleura: No pleural fluid. Clear imaged lungs. Upper Abdomen: Normal imaged portions of the liver, stomach. Musculoskeletal: Mild to moderate upper to midthoracic vertebral body compression deformity, including on sagittal image 73. IMPRESSION: 1. No acute extracardiac findings in the chest. 2. Esophageal air fluid level suggests dysmotility or gastroesophageal reflux. 3. Mild to moderate upper to midthoracic vertebral body compression deformity, age indeterminate. 4. Aortic Atherosclerosis (ICD10-I70.0). Electronically Signed: By: Abigail Miyamoto M.D. On: 10/20/2019 17:32   CT CORONARY FRACTIONAL FLOW RESERVE DATA PREP  Result Date: 10/21/2019 EXAM:  CT FFR ANALYSIS CLINICAL DATA:  Chest pain FINDINGS: FFRct analysis was performed on the original cardiac CT angiogram dataset. Diagrammatic representation of the FFRct analysis is provided in a separate PDF document in PACS. This dictation was created using the PDF document and an interactive 3D model of the results. 3D model is not available in the EMR/PACS. Normal FFR range is >0.80. 1. Left Main: 0.99; no significant stenosis. 2. Proximal LAD: 0.83; no significant stenosis. 3. LCX: 0.98; no significant stenosis. 4. RCA: 0.91; no significant stenosis. IMPRESSION: 1.  CT FFR analysis didn't show any significant stenosis. Eleonore Chiquito, MD Electronically Signed   By: Eleonore Chiquito   On: 10/21/2019 17:19   CT CORONARY FRACTIONAL FLOW RESERVE FLUID ANALYSIS  Result Date: 10/21/2019 EXAM: CT FFR ANALYSIS CLINICAL DATA:  Chest pain FINDINGS: FFRct analysis was performed on the original cardiac CT angiogram dataset. Diagrammatic representation of the FFRct analysis is provided in a separate PDF document in PACS. This dictation was created using the PDF document and an interactive 3D model of the results. 3D model is not available in the EMR/PACS. Normal FFR range is >0.80. 1. Left Main: 0.99; no significant stenosis. 2. Proximal LAD: 0.83; no significant stenosis. 3. LCX: 0.98; no significant stenosis. 4. RCA: 0.91; no significant stenosis. IMPRESSION: 1.  CT FFR analysis didn't show any significant stenosis. Eleonore Chiquito, MD Electronically Signed   By: Eleonore Chiquito   On: 10/21/2019 17:20   IR Radiologist Eval & Mgmt  Result Date: 11/05/2019 Please refer to notes tab for details about interventional procedure. (Op Note)  MR OUTSIDE FILMS SPINE  Result Date: 11/05/2019 This examination belongs to an outside facility and is stored here for comparison purposes only.  Contact the originating outside institution for any associated report or interpretation.  Lumbar MR demonstrates 40% loss of height at L2,  new since prior study by report.  Old L3, L4, and L5 compression deformities, post cement augmentation at L4 and L5.  Multilevel disc protrusions L2-S1.  No spinal stenosis.   Labs:  CBC: No results for input(s): WBC, HGB, HCT, PLT in the last 8760 hours.  COAGS: No results for input(s): INR, APTT in the last 8760 hours.  BMP: Recent Labs    10/05/19 0936  NA 143  K 4.5  CL 110*  CO2 19*  GLUCOSE 96  BUN 20  CALCIUM 9.8  CREATININE 0.93  GFRNONAA 66  GFRAA 76    LIVER FUNCTION TESTS: No results for input(s): BILITOT, AST, ALT, ALKPHOS, PROT, ALBUMIN in the last 8760 hours.  TUMOR MARKERS: No results for input(s): AFPTM, CEA, CA199, CHROMGRNA in the last 8760 hours.  Assessment and Plan:  My impression is that this patient has a subacute L2 compression fracture deformity concurrent with her recent injury and symptomatology.  Her pain is incompletely controlled despite the use of narcotic pain medications, complicated by side effects to the medication.  MR demonstrates uncomplicated mild L2 subacute compression fracture deformity.  No significant retropulsion or posterior element involvement.  Given the  lack of adequate symptom relief with  a fairly aggressive pain medication regimen, and   limitations of activities of daily living, the patient  is clinically an appropriate candidate for consideration of vertebral augmentation.  She is familiar with the procedures, having had 2 lumbar augmentations in the past.  I discussed with the patient  the pathophysiology of vertebral compression fracture deformities; the stable nature of these which does not require emergent treatment; natural history which includes healing over some unpredictable number of months.  We discussed treatment options including watchful waiting, surgical fixation, and percutaneous kyphoplasty/vertebroplasty.  We discussed in detail the percutaneous kyphoplasty technique, anticipated benefits, time course to  symptom resolution, possible risks and side effects.  We discussed his elevated risk of additional level fractures with or without vertebral augmentation.  We discussed the long-term need for continued bone building therapy managed by the patient's PCP.   The patient seemed to understand, and did ask appropriate questions. The patient is motivated to proceed with treatment ASAP.  Accordingly, we schedule percutaneous lumbar L2 kyphoplasty under moderate sedation  as an outpatient at the patient's convenience, pending carrier approval if needed.    Thank you for this interesting consult.  I greatly enjoyed meeting Shanon Brow and  look forward to participating in their care.  A copy of this report was sent to the requesting provider on this date.  Electronically Signed: Rickard Rhymes 11/05/2019, 12:39 PM   I spent a total of  30 Minutes   in face to face in clinical consultation, greater than 50% of which was counseling/coordinating care for painful subacute lumbar L2 compression fracture.

## 2019-11-09 ENCOUNTER — Other Ambulatory Visit (HOSPITAL_COMMUNITY): Payer: Self-pay | Admitting: Interventional Radiology

## 2019-11-09 DIAGNOSIS — S32020A Wedge compression fracture of second lumbar vertebra, initial encounter for closed fracture: Secondary | ICD-10-CM

## 2019-11-10 ENCOUNTER — Other Ambulatory Visit: Payer: Self-pay | Admitting: Student

## 2019-11-11 ENCOUNTER — Ambulatory Visit (HOSPITAL_COMMUNITY)
Admission: RE | Admit: 2019-11-11 | Discharge: 2019-11-11 | Disposition: A | Discharge status: Home / Self Care | Payer: BC Managed Care – PPO | Source: Ambulatory Visit | Attending: Interventional Radiology | Admitting: Interventional Radiology

## 2019-11-11 ENCOUNTER — Encounter (HOSPITAL_COMMUNITY): Payer: Self-pay

## 2019-11-11 ENCOUNTER — Other Ambulatory Visit: Payer: Self-pay

## 2019-11-11 DIAGNOSIS — M549 Dorsalgia, unspecified: Secondary | ICD-10-CM | POA: Insufficient documentation

## 2019-11-11 DIAGNOSIS — Z79899 Other long term (current) drug therapy: Secondary | ICD-10-CM | POA: Diagnosis not present

## 2019-11-11 DIAGNOSIS — S32020A Wedge compression fracture of second lumbar vertebra, initial encounter for closed fracture: Secondary | ICD-10-CM | POA: Diagnosis not present

## 2019-11-11 DIAGNOSIS — I429 Cardiomyopathy, unspecified: Secondary | ICD-10-CM | POA: Insufficient documentation

## 2019-11-11 DIAGNOSIS — M810 Age-related osteoporosis without current pathological fracture: Secondary | ICD-10-CM | POA: Diagnosis not present

## 2019-11-11 DIAGNOSIS — X509XXA Other and unspecified overexertion or strenuous movements or postures, initial encounter: Secondary | ICD-10-CM | POA: Insufficient documentation

## 2019-11-11 DIAGNOSIS — Z7982 Long term (current) use of aspirin: Secondary | ICD-10-CM | POA: Insufficient documentation

## 2019-11-11 DIAGNOSIS — M4856XA Collapsed vertebra, not elsewhere classified, lumbar region, initial encounter for fracture: Secondary | ICD-10-CM | POA: Diagnosis not present

## 2019-11-11 HISTORY — PX: IR KYPHO LUMBAR INC FX REDUCE BONE BX UNI/BIL CANNULATION INC/IMAGING: IMG5519

## 2019-11-11 LAB — CBC
HCT: 39.6 % (ref 36.0–46.0)
Hemoglobin: 13 g/dL (ref 12.0–15.0)
MCH: 30.9 pg (ref 26.0–34.0)
MCHC: 32.8 g/dL (ref 30.0–36.0)
MCV: 94.1 fL (ref 80.0–100.0)
Platelets: 270 K/uL (ref 150–400)
RBC: 4.21 MIL/uL (ref 3.87–5.11)
RDW: 12.8 % (ref 11.5–15.5)
WBC: 4.4 K/uL (ref 4.0–10.5)
nRBC: 0 % (ref 0.0–0.2)

## 2019-11-11 LAB — PROTIME-INR
INR: 1 (ref 0.8–1.2)
Prothrombin Time: 13.4 seconds (ref 11.4–15.2)

## 2019-11-11 MED ORDER — SODIUM CHLORIDE 0.9 % IV BOLUS
250.0000 mL | Freq: Once | INTRAVENOUS | Status: AC
  Administered 2019-11-11: 250 mL via INTRAVENOUS

## 2019-11-11 MED ORDER — SODIUM CHLORIDE 0.9 % IV BOLUS: 250.0000 mL | Freq: Once | INTRAVENOUS | Status: DC

## 2019-11-11 MED ORDER — FENTANYL CITRATE (PF) 100 MCG/2ML IJ SOLN
INTRAMUSCULAR | Status: AC | PRN
  Administered 2019-11-11: 25 ug via INTRAVENOUS
  Administered 2019-11-11: 50 ug via INTRAVENOUS
  Administered 2019-11-11 (×3): 25 ug via INTRAVENOUS

## 2019-11-11 MED ORDER — ONDANSETRON HCL 4 MG/2ML IJ SOLN
INTRAMUSCULAR | Status: AC
  Filled 2019-11-11: qty 2

## 2019-11-11 MED ORDER — IOHEXOL 300 MG/ML  SOLN
50.0000 mL | Freq: Once | INTRAMUSCULAR | Status: AC | PRN
  Administered 2019-11-11: 20 mL

## 2019-11-11 MED ORDER — CEFAZOLIN SODIUM-DEXTROSE 2-4 GM/100ML-% IV SOLN
INTRAVENOUS | Status: AC
  Filled 2019-11-11: qty 100

## 2019-11-11 MED ORDER — SODIUM CHLORIDE 0.9 % IV SOLN: INTRAVENOUS | Status: DC

## 2019-11-11 MED ORDER — FENTANYL CITRATE (PF) 100 MCG/2ML IJ SOLN
INTRAMUSCULAR | Status: AC
  Filled 2019-11-11: qty 2

## 2019-11-11 MED ORDER — MIDAZOLAM HCL 2 MG/2ML IJ SOLN
INTRAMUSCULAR | Status: AC
  Filled 2019-11-11: qty 2

## 2019-11-11 MED ORDER — CEFAZOLIN SODIUM-DEXTROSE 2-4 GM/100ML-% IV SOLN
2.0000 g | Freq: Once | INTRAVENOUS | Status: AC
  Administered 2019-11-11: 2 g via INTRAVENOUS

## 2019-11-11 MED ORDER — ONDANSETRON HCL 4 MG/5ML PO SOLN
4.0000 mg | ORAL | Status: AC
  Administered 2019-11-11: 4 mg via ORAL

## 2019-11-11 MED ORDER — LIDOCAINE HCL (PF) 1 % IJ SOLN
INTRAMUSCULAR | Status: AC
  Filled 2019-11-11: qty 30

## 2019-11-11 MED ORDER — MIDAZOLAM HCL 2 MG/2ML IJ SOLN
INTRAMUSCULAR | Status: AC | PRN
  Administered 2019-11-11 (×4): 0.5 mg via INTRAVENOUS

## 2019-11-11 MED ORDER — SUMATRIPTAN SUCCINATE 100 MG PO TABS
100.0000 mg | ORAL_TABLET | ORAL | Status: AC
  Administered 2019-11-11: 100 mg via ORAL
  Filled 2019-11-11: qty 1

## 2019-11-11 NOTE — Procedures (Signed)
  Procedure: Lumbar L2 kyphoplasty   EBL:   minimal Complications:  none immediate  See full dictation in [employer].  Dillard Cannon MD Main # [phone] Pager  [phone]

## 2019-11-11 NOTE — Progress Notes (Signed)
Patient and husband was given discharge instructions. Both verbalized understanding. 

## 2019-11-11 NOTE — Discharge Instructions (Signed)
KYPHOPLASTY/VERTEBROPLASTY DISCHARGE INSTRUCTIONS  Medications: (check all that apply)     Resume all home medications as before procedure.       Resume your (aspirin/Plavix/Coumadin) on 11/12/2019              Continue your pain medications as prescribed as needed.  Over the next 3-5 days, decrease your pain medication as tolerated.  Over the counter medications (i.e. Tylenol, ibuprofen, and aleve) may be substituted once severe/moderate pain symptoms have subsided.   Wound Care: - Bandages may be removed the day following your procedure.  You may get your incision wet once bandages are removed.  Bandaids may be used to cover the incisions until scab formation.  Topical ointments are optional.  - If you develop a fever greater than 101 degrees, have increased skin redness at the incision sites or pus-like oozing from incisions occurring within 1 week of the procedure, contact radiology at [phone] or [phone].  - Ice pack to back for 15-20 minutes 2-3 time per day for first 2-3 days post procedure.  The ice will expedite muscle healing and help with the pain from the incisions.   Activity: - Bedrest today with limited activity for 24 hours post procedure.  - No driving for 48 hours.  - Increase your activity as tolerated after bedrest (with assistance if necessary).  - Refrain from any strenuous activity or heavy lifting (greater than 10 lbs.).   Follow up: - Contact radiology at [phone] or [phone] if any questions/concerns.  - A physician assistant from radiology will contact you in approximately 1 week.  - If a biopsy was performed at the time of your procedure, your referring physician should receive the results in usually 2-3 days.

## 2019-11-11 NOTE — H&P (Signed)
Chief Complaint: Patient was seen in consultation today for L2 compression fracture  Referring Physician(s): Dr. Bayard Males  Supervising Physician: Arne Cleveland  Patient Status: [facility] - Out-pt  History of Present Illness: Tracy Cook is a 64 y.o. female with a history of osteoporosis and previous thoracic and lumbar compression fractures, status post cement augmentation at L4 and L5 approximately 2 years ago.  She recently suffered a new L2 compression fracture which likely occurred while she was moving/lifting some boxes at home.  She has had persistent pain since this time.  She met with Dr. Vernard Gambles in consultation 11/05/19 to discuss vertebroplasty/kyphoplasty.  After discussion, she elects to proceed and presents for procedure today.   Patient assessed at bedside. She does have a headache.  Her back pain is unchanged from her recent visit with Dr. Vernard Gambles.  She is agreeable to proceeding with vertebroplasty/kyphoplasty as planned.  She has been NPO.  She does not take blood thinners.  She denies fever, chills, nausea, vomiting, abdominal pain, dysuria.   Past Medical History:  Diagnosis Date  . Cardiomyopathy ([place])   . Diverticulosis   . Insomnia   . Menopause   . Migraine   . Orthostatic hypotension   . Osteoporosis   . Other malaise and fatigue   . Persistent disorder of initiating or maintaining sleep   . POTS (postural orthostatic tachycardia syndrome)   . Tachycardia, unspecified     Past Surgical History:  Procedure Laterality Date  . IR RADIOLOGIST EVAL & MGMT  11/05/2019  . No prior surgery      Allergies: Alendronate sodium, Codeine, Hydrocodone-acetaminophen, Parathyroid hormone (recomb), and Vicodin [hydrocodone-acetaminophen]  Medications: Prior to Admission medications   Medication Sig Start Date End Date Taking? Authorizing Provider  aspirin EC 81 MG tablet Take 1 tablet (81 mg total) by mouth daily. 10/21/19  Yes Lelon Perla, MD    ondansetron (ZOFRAN ODT) 4 MG disintegrating tablet Take 1 tablet (4 mg total) by mouth every 8 (eight) hours as needed for nausea or vomiting. 08/11/19  Yes Blue, Olivia C, PA-C  oxyCODONE-acetaminophen (PERCOCET/ROXICET) 5-325 MG tablet Take 1 tablet by mouth 2 (two) times daily as needed. 10/30/19  Yes [provider]  propranolol (INDERAL) 20 MG tablet TAKE 1 TABLET (20 MG TOTAL) BY MOUTH 3 TIMES DAILY. Patient taking differently: Take 20 mg by mouth 3 (three) times daily. TAKE 1 TABLET (20 MG TOTAL) BY MOUTH 3 TIMES DAILY. 06/01/19  Yes Roma Schanz R, DO  SUMAtriptan (IMITREX) 100 MG tablet SEE NOTES Patient taking differently: Take 100 mg by mouth every 2 (two) hours as needed for migraine. SEE NOTES 01/06/18  Yes Jaffe, Adam R, DO  tiZANidine (ZANAFLEX) 4 MG tablet Take 4 mg by mouth every 8 (eight) hours as needed for muscle spasms.  05/02/15  Yes [provider]  topiramate (TOPAMAX) 100 MG tablet Take 100 mg by mouth in the morning, at noon, and at bedtime.   Yes [provider]  amphetamine-dextroamphetamine (ADDERALL) 10 MG tablet Take 1 tablet (10 mg total) by mouth 2 (two) times daily. Patient not taking: Reported on 10/20/2019 08/20/19   Carollee Herter, Kendrick Fries R, DO  metoprolol tartrate (LOPRESSOR) 100 MG tablet TAKE 2 HOURS PRIOR TO CT SCAN 09/16/19   Lelon Perla, MD     Family History  Problem Relation Age of Onset  . Coronary artery disease Father        Died of MI at age 11  . Asthma  Father   . Hypertension Father   . Sudden death Father   . Heart attack Father   . Stroke Mother   . Diabetes Neg Hx   . Hyperlipidemia Neg Hx     Social History   Socioeconomic History  . Marital status: Married    Spouse name: Not on file  . Number of children: 2  . Years of education: Not on file  . Highest education level: Not on file  Occupational History    Comment: Medial lab technologist  Tobacco Use  . Smoking status: Never Smoker  .  Smokeless tobacco: Never Used  Substance and Sexual Activity  . Alcohol use: No  . Drug use: No  . Sexual activity: Yes    Partners: Male  Other Topics Concern  . Not on file  Social History Narrative   Exercise almost every day   Social Determinants of Health   Financial Resource Strain:   . Difficulty of Paying Living Expenses:   Food Insecurity:   . Worried About Charity fundraiser in the Last Year:   . Arboriculturist in the Last Year:   Transportation Needs:   . Film/video editor (Medical):   Marland Kitchen Lack of Transportation (Non-Medical):   Physical Activity:   . Days of Exercise per Week:   . Minutes of Exercise per Session:   Stress:   . Feeling of Stress :   Social Connections:   . Frequency of Communication with Friends and Family:   . Frequency of Social Gatherings with Friends and Family:   . Attends Religious Services:   . Active Member of Clubs or Organizations:   . Attends Archivist Meetings:   Marland Kitchen Marital Status:      Review of Systems: A 12 point ROS discussed and pertinent positives are indicated in the HPI above.  All other systems are negative.  Review of Systems  Constitutional: Negative for fatigue and fever.  Respiratory: Negative for cough and shortness of breath.   Cardiovascular: Negative for chest pain.  Gastrointestinal: Negative for abdominal pain.  Genitourinary: Negative for dysuria.  Musculoskeletal: Negative for back pain.  Neurological: Negative for dizziness and headaches.  Psychiatric/Behavioral: Negative for behavioral problems and confusion.    Vital Signs: BP 102/78   Pulse 78   Temp (!) 97.2 F (36.2 C) (Skin)   Resp 18   Ht '5\' 9"'  (1.753 m)   Wt 125 lb (56.7 kg)   SpO2 100%   BMI 18.46 kg/m   Physical Exam Vitals and nursing note reviewed.  Constitutional:      General: She is not in acute distress.    Appearance: Normal appearance. She is not ill-appearing.  HENT:     Mouth/Throat:     Mouth: Mucous  membranes are moist.     Pharynx: Oropharynx is clear.  Cardiovascular:     Rate and Rhythm: Normal rate and regular rhythm.  Pulmonary:     Effort: Pulmonary effort is normal. No respiratory distress.     Breath sounds: Normal breath sounds.  Abdominal:     General: Abdomen is flat. There is no distension.     Palpations: Abdomen is soft.  Musculoskeletal:        General: Tenderness present. Normal range of motion.  Skin:    General: Skin is warm and dry.  Neurological:     General: No focal deficit present.     Mental Status: She is alert and oriented to person,  place, and time. Mental status is at baseline.  Psychiatric:        Mood and Affect: Mood normal.        Behavior: Behavior normal.        Thought Content: Thought content normal.        Judgment: Judgment normal.      MD Evaluation Airway: WNL Heart: WNL Abdomen: WNL Chest/ Lungs: WNL ASA  Classification: 3 Mallampati/Airway Score: Two   Imaging: CT CORONARY MORPH W/CTA COR W/SCORE W/CA W/CM &/OR WO/CM  Addendum Date: 10/20/2019   ADDENDUM REPORT: 10/20/2019 21:02 CLINICAL DATA:  Chest pain EXAM: Cardiac/Coronary CTA TECHNIQUE: The patient was scanned on a [employer]. A 100 kV prospective scan was triggered in the descending thoracic aorta at 111 HU's. Axial non-contrast 3 mm slices were carried out through the heart. The data set was analyzed on a dedicated work station and scored using the [place]. Gantry rotation speed was 250 msecs and collimation was .6 mm. 20 mg IV metoprolol and 0.8 mg of sl NTG was given. The 3D data set was reconstructed in 5% intervals of the 35-75 % of the R-R cycle. Diastolic phases were analyzed on a dedicated work station using MPR, MIP and VRT modes. The patient received 80 cc of contrast. FINDINGS: Image quality: excellent. Noise artifact is: Limited. Coronary Arteries:  Normal coronary origin.  Left dominance. Left main: The left main is a large caliber vessel with  a normal take off from the left coronary cusp that bifurcates to form a left anterior descending artery and a left circumflex artery. There is no plaque or stenosis. Left anterior descending artery: The proximal LAD contains moderate mixed density plaque (50-69%). The mid and distal LAD segments are patent. The LAD gives off 1 patent diagonal branch. Left circumflex artery: The LCX is dominant. There is minimal non-calcified plaque in the distal LCX (<25%). The LCX gives off 2 patent obtuse marginal branches. The LCX terminates as a PDA without evidence of plaque or stenosis. Right coronary artery: The RCA is non-dominant with normal take off from the right coronary cusp. There is no evidence of plaque or stenosis. Right Atrium: Right atrial size is within normal limits. Right Ventricle: The right ventricular cavity is within normal limits. Left Atrium: Left atrial size is normal in size with no left atrial appendage filling defect. Left Ventricle: The ventricular cavity size is within normal limits. There are no stigmata of prior infarction. There is no abnormal filling defect. Pulmonary arteries: Normal in size without proximal filling defect. Pulmonary veins: Normal pulmonary venous drainage. Pericardium: Normal thickness with no significant effusion or calcium present. Cardiac valves: The aortic valve is trileaflet without significant calcification. The mitral valve is normal structure without significant calcification. Aorta: Aortic root measures up to 35 mm in double oblique. Normal caliber with no significant disease. Extra-cardiac findings: See attached radiology report for non-cardiac structures. IMPRESSION: 1. Coronary calcium score of 11. This was 67th percentile for age and sex matched controls. 2. Normal coronary origin with left dominance. 3. Moderate mixed density plaque in the proximal LAD (50-69%). RECOMMENDATIONS: 1. Moderate stenosis in the proximal LAD. Consider symptom-guided anti-ischemic  pharmacotherapy as well as risk factor modification per guideline directed care. Additional analysis with CT FFR will be submitted. Eleonore Chiquito, MD Electronically Signed   By: Eleonore Chiquito   On: 10/20/2019 21:02   Result Date: 10/20/2019 EXAM: OVER-READ INTERPRETATION  CT CHEST The following report is an over-read performed by radiologist  Dr. Abigail Miyamoto of [facility], [place] on 10/20/2019. This over-read does not include interpretation of cardiac or coronary anatomy or pathology. The coronary CTA interpretation by the cardiologist is attached. COMPARISON:  None. FINDINGS: Vascular: Tortuous thoracic aorta. Aortic atherosclerosis. On No central pulmonary embolism, on this non-dedicated study. Mediastinum/Nodes: No imaged thoracic adenopathy. Esophageal fluid level on 18/11. Lungs/Pleura: No pleural fluid. Clear imaged lungs. Upper Abdomen: Normal imaged portions of the liver, stomach. Musculoskeletal: Mild to moderate upper to midthoracic vertebral body compression deformity, including on sagittal image 73. IMPRESSION: 1. No acute extracardiac findings in the chest. 2. Esophageal air fluid level suggests dysmotility or gastroesophageal reflux. 3. Mild to moderate upper to midthoracic vertebral body compression deformity, age indeterminate. 4. Aortic Atherosclerosis (ICD10-I70.0). Electronically Signed: By: Abigail Miyamoto M.D. On: 10/20/2019 17:32   CT CORONARY FRACTIONAL FLOW RESERVE DATA PREP  Result Date: 10/21/2019 EXAM: CT FFR ANALYSIS CLINICAL DATA:  Chest pain FINDINGS: FFRct analysis was performed on the original cardiac CT angiogram dataset. Diagrammatic representation of the FFRct analysis is provided in a separate PDF document in PACS. This dictation was created using the PDF document and an interactive 3D model of the results. 3D model is not available in the EMR/PACS. Normal FFR range is >0.80. 1. Left Main: 0.99; no significant stenosis. 2. Proximal LAD: 0.83; no significant stenosis. 3.  LCX: 0.98; no significant stenosis. 4. RCA: 0.91; no significant stenosis. IMPRESSION: 1.  CT FFR analysis didn't show any significant stenosis. Eleonore Chiquito, MD Electronically Signed   By: Eleonore Chiquito   On: 10/21/2019 17:19   CT CORONARY FRACTIONAL FLOW RESERVE FLUID ANALYSIS  Result Date: 10/21/2019 EXAM: CT FFR ANALYSIS CLINICAL DATA:  Chest pain FINDINGS: FFRct analysis was performed on the original cardiac CT angiogram dataset. Diagrammatic representation of the FFRct analysis is provided in a separate PDF document in PACS. This dictation was created using the PDF document and an interactive 3D model of the results. 3D model is not available in the EMR/PACS. Normal FFR range is >0.80. 1. Left Main: 0.99; no significant stenosis. 2. Proximal LAD: 0.83; no significant stenosis. 3. LCX: 0.98; no significant stenosis. 4. RCA: 0.91; no significant stenosis. IMPRESSION: 1.  CT FFR analysis didn't show any significant stenosis. Eleonore Chiquito, MD Electronically Signed   By: Eleonore Chiquito   On: 10/21/2019 17:20   IR Radiologist Eval & Mgmt  Result Date: 11/05/2019 Please refer to notes tab for details about interventional procedure. (Op Note)  MR OUTSIDE FILMS SPINE  Result Date: 11/05/2019 This examination belongs to an outside facility and is stored here for comparison purposes only.  Contact the originating outside institution for any associated report or interpretation.   Labs:  CBC: Recent Labs    11/11/19 0839  WBC 4.4  HGB 13.0  HCT 39.6  PLT 270    COAGS: No results for input(s): INR, APTT in the last 8760 hours.  BMP: Recent Labs    10/05/19 0936  NA 143  K 4.5  CL 110*  CO2 19*  GLUCOSE 96  BUN 20  CALCIUM 9.8  CREATININE 0.93  GFRNONAA 66  GFRAA 76    LIVER FUNCTION TESTS: No results for input(s): BILITOT, AST, ALT, ALKPHOS, PROT, ALBUMIN in the last 8760 hours.  TUMOR MARKERS: No results for input(s): AFPTM, CEA, CA199, CHROMGRNA in the last 8760  hours.  Assessment and Plan: Patient with past medical history of L4 and L5 compression fractures presents with complaint of L2 compression fracture.  She  recently met with Dr. Vernard Gambles in consultation to discussion treatment and management of her compression fracture.  She elects to proceed with L2 vertebroplasty/kyphoplasty.  Patient presents today in their usual state of health.  She has been NPO and is not currently on blood thinners.   Risks and benefits were discussed with the patient including, but not limited to education regarding the natural healing process of compression fractures without intervention, bleeding, infection, cement migration which may cause spinal cord damage, paralysis, pulmonary embolism or even death.  This interventional procedure involves the use of X-rays and because of the nature of the planned procedure, it is possible that we will have prolonged use of X-ray fluoroscopy.  Potential radiation risks to you include (but are not limited to) the following: - A slightly elevated risk for cancer  several years later in life. This risk is typically less than 0.5% percent. This risk is low in comparison to the normal incidence of humancancer, which is 33% for women and 50% for men according to the [place]. - Radiation induced injury can include skin redness, resembling a rash, tissue breakdown / ulcers and hair loss (which can be temporary or permanent).   The likelihood of either of these occurring depends on the difficulty of the procedure and whether you are sensitive to radiation due to previous procedures, disease, or genetic conditions.   IF your procedure requires a prolonged use of radiation, you will be notified and given written instructions for further action.  It is your responsibility to monitor the irradiated area for the 2 weeks following the procedure and to notify your physician if you are concerned that you have suffered a radiation induced  injury.    All of the patient's questions were answered, patient is agreeable to proceed.  Consent signed and in chart.  Thank you for this interesting consult.  I greatly enjoyed meeting Tracy Cook and look forward to participating in their care.  A copy of this report was sent to the requesting provider on this date.  Electronically Signed: Docia Barrier, PA 11/11/2019, 9:38 AM   I spent a total of    15 Minutes in face to face in clinical consultation, greater than 50% of which was counseling/coordinating care for L2 compression fracture.

## 2019-11-16 DIAGNOSIS — G43009 Migraine without aura, not intractable, without status migrainosus: Secondary | ICD-10-CM | POA: Diagnosis not present

## 2019-11-16 DIAGNOSIS — G43709 Chronic migraine without aura, not intractable, without status migrainosus: Secondary | ICD-10-CM | POA: Diagnosis not present

## 2019-11-16 DIAGNOSIS — I498 Other specified cardiac arrhythmias: Secondary | ICD-10-CM | POA: Diagnosis not present

## 2019-11-20 ENCOUNTER — Other Ambulatory Visit: Payer: Self-pay | Admitting: Interventional Radiology

## 2019-11-20 DIAGNOSIS — S32000A Wedge compression fracture of unspecified lumbar vertebra, initial encounter for closed fracture: Secondary | ICD-10-CM

## 2019-12-02 DIAGNOSIS — M545 Low back pain: Secondary | ICD-10-CM | POA: Diagnosis not present

## 2019-12-09 ENCOUNTER — Other Ambulatory Visit: Payer: Self-pay | Admitting: Interventional Radiology

## 2019-12-09 ENCOUNTER — Ambulatory Visit
Admission: RE | Admit: 2019-12-09 | Discharge: 2019-12-09 | Disposition: A | Discharge status: Home / Self Care | Payer: BC Managed Care – PPO | Source: Ambulatory Visit | Attending: Interventional Radiology | Admitting: Interventional Radiology

## 2019-12-09 ENCOUNTER — Other Ambulatory Visit (HOSPITAL_COMMUNITY): Payer: Self-pay | Admitting: Interventional Radiology

## 2019-12-09 ENCOUNTER — Ambulatory Visit
Admission: RE | Admit: 2019-12-09 | Discharge: 2019-12-09 | Disposition: A | Discharge status: Home / Self Care | Payer: Self-pay | Source: Ambulatory Visit | Attending: Interventional Radiology | Admitting: Interventional Radiology

## 2019-12-09 ENCOUNTER — Encounter: Payer: Self-pay | Admitting: *Deleted

## 2019-12-09 DIAGNOSIS — S32000A Wedge compression fracture of unspecified lumbar vertebra, initial encounter for closed fracture: Secondary | ICD-10-CM

## 2019-12-09 DIAGNOSIS — S32020D Wedge compression fracture of second lumbar vertebra, subsequent encounter for fracture with routine healing: Secondary | ICD-10-CM | POA: Diagnosis not present

## 2019-12-09 DIAGNOSIS — S32030A Wedge compression fracture of third lumbar vertebra, initial encounter for closed fracture: Secondary | ICD-10-CM

## 2019-12-09 HISTORY — PX: IR RADIOLOGIST EVAL & MGMT: IMG5224

## 2019-12-09 NOTE — Progress Notes (Signed)
Patient ID: Tracy Cook, female   DOB: June 04, 1956, 64 y.o.   MRN: [id number]       Chief Complaint: Patient was seen in consultation today for Pain post kyphoplasty  at the request of Yavier Snider  Referring Physician(s): Ramos  History of Present Illness: Tracy Cook is a 64 y.o. female  with a history of osteoporosis and previous thoracic and lumbar compression fractures, status post cement augmentation at L4 and L5 approximately 2 years ago.  She is on calcium and vitamin D supplementation.   11/10/2019  moving some boxes as part of the home move and noted development of acute lumbar pain across the midline, slightly greater left than right.  No lower extremity component.  No bowel or bladder control issues.  9 out of 10 on the visual analog pain scale. 10/30/2019 MRI demonstrates subacute L2 compression fracture deformity adjacent to the inferior endplate.  Old compression deformities of L4 and L5 post cement augmentation. 11/11/2019 technically successful Lumbar L2 kyphoplasty  She states that she did well for the first week after she went home with improvement in pain.  She was moving a car seat for one of her grandchildren and noticed development of new acute lumbar back pain very reminiscent of previous lumbar compression fracture deformities.  Subsequent outpatient MRI demonstrates new subacute L3 compression fracture deformity with 30-40% loss of vertebral body height.  Persistent edema in L2 with stable Compression deformity.  She was given Percocet, which partially controls the pain taken from 10 out of 10 to 7 out of 10.   Past Medical History:  Diagnosis Date  . Cardiomyopathy ([place])   . Diverticulosis   . Insomnia   . Menopause   . Migraine   . Orthostatic hypotension   . Osteoporosis   . Other malaise and fatigue   . Persistent disorder of initiating or maintaining sleep   . POTS (postural orthostatic tachycardia syndrome)   . Tachycardia, unspecified      Past Surgical History:  Procedure Laterality Date  . IR KYPHO LUMBAR INC FX REDUCE BONE BX UNI/BIL CANNULATION INC/IMAGING  11/11/2019  . IR RADIOLOGIST EVAL & MGMT  11/05/2019  . IR RADIOLOGIST EVAL & MGMT  12/09/2019  . No prior surgery      Allergies: Alendronate sodium, Codeine, Hydrocodone-acetaminophen, Parathyroid hormone (recomb), and Vicodin [hydrocodone-acetaminophen]  Medications: Prior to Admission medications   Medication Sig Start Date End Date Taking? Authorizing Provider  amphetamine-dextroamphetamine (ADDERALL) 10 MG tablet Take 1 tablet (10 mg total) by mouth 2 (two) times daily. Patient not taking: Reported on 10/20/2019 08/20/19   Carollee Herter, Alferd Apa, DO  aspirin EC 81 MG tablet Take 1 tablet (81 mg total) by mouth daily. 10/21/19   Lelon Perla, MD  metoprolol tartrate (LOPRESSOR) 100 MG tablet TAKE 2 HOURS PRIOR TO CT SCAN 09/16/19   Lelon Perla, MD  ondansetron (ZOFRAN ODT) 4 MG disintegrating tablet Take 1 tablet (4 mg total) by mouth every 8 (eight) hours as needed for nausea or vomiting. 08/11/19   Blue, Olivia C, PA-C  oxyCODONE-acetaminophen (PERCOCET/ROXICET) 5-325 MG tablet Take 1 tablet by mouth 2 (two) times daily as needed. 10/30/19   [provider]  propranolol (INDERAL) 20 MG tablet TAKE 1 TABLET (20 MG TOTAL) BY MOUTH 3 TIMES DAILY. Patient taking differently: Take 20 mg by mouth 3 (three) times daily. TAKE 1 TABLET (20 MG TOTAL) BY MOUTH 3 TIMES DAILY. 06/01/19   [name], DO  SUMAtriptan (IMITREX)  100 MG tablet SEE NOTES Patient taking differently: Take 100 mg by mouth every 2 (two) hours as needed for migraine. SEE NOTES 01/06/18   Pieter Partridge, DO  tiZANidine (ZANAFLEX) 4 MG tablet Take 4 mg by mouth every 8 (eight) hours as needed for muscle spasms.  05/02/15   [provider]  topiramate (TOPAMAX) 100 MG tablet Take 100 mg by mouth in the morning, at noon, and at bedtime.    [provider]      Family History  Problem Relation Age of Onset  . Coronary artery disease Father        Died of MI at age 44  . Asthma Father   . Hypertension Father   . Sudden death Father   . Heart attack Father   . Stroke Mother   . Diabetes Neg Hx   . Hyperlipidemia Neg Hx     Social History   Socioeconomic History  . Marital status: Married    Spouse name: Not on file  . Number of children: 2  . Years of education: Not on file  . Highest education level: Not on file  Occupational History    Comment: Medial lab technologist  Tobacco Use  . Smoking status: Never Smoker  . Smokeless tobacco: Never Used  Substance and Sexual Activity  . Alcohol use: No  . Drug use: No  . Sexual activity: Yes    Partners: Male  Other Topics Concern  . Not on file  Social History Narrative   Exercise almost every day   Social Determinants of Health   Financial Resource Strain:   . Difficulty of Paying Living Expenses:   Food Insecurity:   . Worried About Charity fundraiser in the Last Year:   . Arboriculturist in the Last Year:   Transportation Needs:   . Film/video editor (Medical):   Marland Kitchen Lack of Transportation (Non-Medical):   Physical Activity:   . Days of Exercise per Week:   . Minutes of Exercise per Session:   Stress:   . Feeling of Stress :   Social Connections:   . Frequency of Communication with Friends and Family:   . Frequency of Social Gatherings with Friends and Family:   . Attends Religious Services:   . Active Member of Clubs or Organizations:   . Attends Archivist Meetings:   Marland Kitchen Marital Status:     ECOG Status: 1 - Symptomatic but completely ambulatory  Review of Systems: A 12 point ROS discussed and pertinent positives are indicated in the HPI above.  All other systems are negative.  Review of Systems  Vital Signs: There were no vitals taken for this visit.  Physical Exam Constitutional: Oriented to person, place, and time. Well-developed and  well-nourished. No distress.   HENT:  Head: Normocephalic and atraumatic.  Eyes: Conjunctivae and EOM are normal. Right eye exhibits no discharge. Left eye exhibits no discharge. No scleral icterus.  Neck: No JVD present.  Pulmonary/Chest: Effort normal. No stridor. No respiratory distress.  Abdomen: soft, non distended Neurological:  alert and oriented to person, place, and time.  Skin: Skin is warm and dry.  not diaphoretic.  Psychiatric:   normal mood and affect.   behavior is normal. Judgment and thought content normal.      Imaging: IR KYPHO LUMBAR INC FX REDUCE BONE BX UNI/BIL CANNULATION INC/IMAGING  Result Date: 11/12/2019 CLINICAL DATA:  Subacute painful compression fracture deformity of lumbar L2 EXAM: KYPHOPLASTY  AT LUMBAR L2 TECHNIQUE: The procedure, risks (including but not limited to bleeding, infection, organ damage), benefits, and alternatives were explained to the patient. Questions regarding the procedure were encouraged and answered. The patient understands and consents to the procedure. The patient was placed prone on the fluoroscopic table. The skin overlying the upper lumbar region was then prepped and draped in the usual sterile fashion. Maximal barrier sterile technique was utilized including caps, mask, sterile gowns, sterile gloves, sterile drape, hand hygiene and skin antiseptic. Intravenous Fentanyl 143mcg and Versed 2mg  were administered as conscious sedation during continuous monitoring of the patient's level of consciousness and physiological / cardiorespiratory status by the radiology RN, with a total moderate sedation time of 26 minutes. As antibiotic prophylaxis, cefazolin 2 g was ordered pre-procedure and administered intravenously within !one hour! of incision. The right pedicle at lumbar L2 was then infiltrated with 1% lidocaine followed by the advancement of a Kyphon trocar needle through the right pedicle into the posterior one-third. The trocar was removed and  the osteo drill was advanced to the anterior third of the vertebral body. The osteo drill was retracted. Through the working cannula, a Kyphon inflatable bone tamp 15 x 3 was advanced and positioned with the distal marker 5 mm from the anterior aspect of the cortex. Crossing of the midline was not seen on the AP projection. At this time, the balloon was expanded using contrast via a Kyphon inflation syringe device via micro tubing. In similar fashion, the left pedicle was infiltrated with 1% lidocaine followed by the advancement of a second Kyphon trocar needle through the left pedicle into the posterior third of the vertebral body. The osteo drill was coaxially advanced to the anterior right third. The osteo drill was exchanged for a Kyphon inflatable bone tamp 15 x 3, advanced to the 5 mm of the anterior aspect of the cortex. The balloon was then expanded using contrast as above. Inflations were continued until there was near apposition across the midline. At this time, methylmethacrylate mixture was reconstituted in the Kyphon bone mixing device system. This was then loaded into the delivery mechanism, attached to Kyphon bone fillers. The balloons were deflated and removed followed by the instillation of methylmethacrylate mixture with excellent filling in the AP and lateral projections. No extravasation was noted in the disk spaces or posteriorly into the spinal canal. No epidural venous contamination was seen. The patient tolerated the procedure well. There were no acute complications. The working cannulae and the bone filler were then retrieved and removed. COMPLICATIONS: COMPLICATIONS None immediate. IMPRESSION: 1. Status post vertebral body augmentation using balloon kyphoplasty at lumbar L2 as described without event. 2. Per CMS PQRS reporting requirements (PQRS Measure 24): Given the patient's age of greater than 47 and the fracture site (hip, distal radius, or spine), the patient should be tested for  osteoporosis using DXA, and the appropriate treatment considered based on the DXA results. Electronically Signed   By: Lucrezia Europe M.D.   On: 11/12/2019 10:39   IR Radiologist Eval & Mgmt  Result Date: 12/09/2019 Please refer to notes tab for details about interventional procedure. (Op Note)   Labs:  CBC: Recent Labs    11/11/19 0839  WBC 4.4  HGB 13.0  HCT 39.6  PLT 270    COAGS: Recent Labs    11/11/19 0942  INR 1.0    BMP: Recent Labs    10/05/19 0936  NA 143  K 4.5  CL 110*  CO2 19*  GLUCOSE 96  BUN 20  CALCIUM 9.8  CREATININE 0.93  GFRNONAA 66  GFRAA 76    LIVER FUNCTION TESTS: No results for input(s): BILITOT, AST, ALT, ALKPHOS, PROT, ALBUMIN in the last 8760 hours.  TUMOR MARKERS: No results for input(s): AFPTM, CEA, CA199, CHROMGRNA in the last 8760 hours.  Assessment and Plan:  My impression is that this patient has suffered a subacute L3 compression fracture deformity which results and significant recurrent pain after recent L2 kyphoplasty. Based on MR imaging, this would be anatomically approachable for percutaneous intervention.  No associated spinal stenosis or other contraindications.  No suggestion of metastatic disease or other pathologic findings to indicate a need for concomitant core biopsy. Given the  lack of adequate symptom relief with time and a fairly aggressive pain medication regimen, and   limitations of activities of daily living, the patient  is clinically an appropriate candidate for consideration of vertebral augmentation. I reviewed again with the patient  the pathophysiology of vertebral compression fracture deformities; the stable nature of these which does not require emergent treatment; natural history which includes healing over some unpredictable number of months.  We discussed treatment options including watchful waiting, surgical fixation, and percutaneous kyphoplasty/vertebroplasty.  We reviewed  the percutaneous kyphoplasty  technique, anticipated benefits, time course to symptom resolution, possible risks and side effects.  We discussed  elevated risk of additional level fractures with or without vertebral augmentation.  We discussed the long-term need for continued bone building therapy managed by the patient's PCP.   She seemed to understand, and did ask appropriate questions. The patient is motivated to proceed with treatment ASAP.  Accordingly, we schedule percutaneous lumbar L3 kyphoplasty under moderate sedation at [facility] or [facility] as an outpatient at the patient's convenience, pending carrier approval if needed.    Thank you for this interesting consult.  I greatly enjoyed meeting Tracy Cook and look forward to participating in their care.  A copy of this report was sent to the requesting provider on this date.  Electronically Signed: Rickard Rhymes 12/09/2019, 4:10 PM   I spent a total of    25 Minutes in face to face in clinical consultation, greater than 50% of which was counseling/coordinating care for painful subacute L3 compression fracture.

## 2019-12-16 ENCOUNTER — Other Ambulatory Visit: Payer: Self-pay | Admitting: Student

## 2019-12-16 ENCOUNTER — Other Ambulatory Visit: Payer: Self-pay | Admitting: Physician Assistant

## 2019-12-17 ENCOUNTER — Ambulatory Visit (HOSPITAL_COMMUNITY)
Admission: RE | Admit: 2019-12-17 | Discharge: 2019-12-17 | Disposition: A | Discharge status: Home / Self Care | Payer: BC Managed Care – PPO | Source: Ambulatory Visit | Attending: Student | Admitting: Student

## 2019-12-17 ENCOUNTER — Other Ambulatory Visit: Payer: Self-pay

## 2019-12-17 ENCOUNTER — Ambulatory Visit (HOSPITAL_COMMUNITY)
Admission: RE | Admit: 2019-12-17 | Discharge: 2019-12-17 | Disposition: A | Discharge status: Home / Self Care | Payer: BC Managed Care – PPO | Source: Ambulatory Visit | Attending: Interventional Radiology | Admitting: Interventional Radiology

## 2019-12-17 DIAGNOSIS — I429 Cardiomyopathy, unspecified: Secondary | ICD-10-CM | POA: Insufficient documentation

## 2019-12-17 DIAGNOSIS — M810 Age-related osteoporosis without current pathological fracture: Secondary | ICD-10-CM | POA: Insufficient documentation

## 2019-12-17 DIAGNOSIS — Z79899 Other long term (current) drug therapy: Secondary | ICD-10-CM | POA: Insufficient documentation

## 2019-12-17 DIAGNOSIS — S32030A Wedge compression fracture of third lumbar vertebra, initial encounter for closed fracture: Secondary | ICD-10-CM

## 2019-12-17 DIAGNOSIS — M4856XA Collapsed vertebra, not elsewhere classified, lumbar region, initial encounter for fracture: Secondary | ICD-10-CM | POA: Insufficient documentation

## 2019-12-17 DIAGNOSIS — Z01818 Encounter for other preprocedural examination: Secondary | ICD-10-CM | POA: Diagnosis not present

## 2019-12-17 DIAGNOSIS — Z7982 Long term (current) use of aspirin: Secondary | ICD-10-CM | POA: Insufficient documentation

## 2019-12-17 DIAGNOSIS — T148XXA Other injury of unspecified body region, initial encounter: Secondary | ICD-10-CM

## 2019-12-17 HISTORY — PX: IR KYPHO LUMBAR INC FX REDUCE BONE BX UNI/BIL CANNULATION INC/IMAGING: IMG5519

## 2019-12-17 LAB — CBC
HCT: 39.6 % (ref 36.0–46.0)
Hemoglobin: 12.6 g/dL (ref 12.0–15.0)
MCH: 30.2 pg (ref 26.0–34.0)
MCHC: 31.8 g/dL (ref 30.0–36.0)
MCV: 95 fL (ref 80.0–100.0)
Platelets: 308 10*3/uL (ref 150–400)
RBC: 4.17 MIL/uL (ref 3.87–5.11)
RDW: 12.8 % (ref 11.5–15.5)
WBC: 4.9 10*3/uL (ref 4.0–10.5)
nRBC: 0 % (ref 0.0–0.2)

## 2019-12-17 LAB — PROTIME-INR
INR: 1 (ref 0.8–1.2)
Prothrombin Time: 12.4 seconds (ref 11.4–15.2)

## 2019-12-17 MED ORDER — ONDANSETRON HCL 4 MG/2ML IJ SOLN
INTRAMUSCULAR | Status: AC
  Filled 2019-12-17: qty 2

## 2019-12-17 MED ORDER — ONDANSETRON HCL 4 MG/2ML IJ SOLN: 4.0000 mg | Freq: Once | INTRAMUSCULAR | Status: DC

## 2019-12-17 MED ORDER — MIDAZOLAM HCL 2 MG/2ML IJ SOLN
INTRAMUSCULAR | Status: AC | PRN
  Administered 2019-12-17: 0.5 mg via INTRAVENOUS
  Administered 2019-12-17: 1 mg via INTRAVENOUS
  Administered 2019-12-17: 0.5 mg via INTRAVENOUS

## 2019-12-17 MED ORDER — KETOROLAC TROMETHAMINE 30 MG/ML IJ SOLN
INTRAMUSCULAR | Status: AC | PRN
  Administered 2019-12-17: 30 mg via INTRAVENOUS

## 2019-12-17 MED ORDER — FENTANYL CITRATE (PF) 100 MCG/2ML IJ SOLN
INTRAMUSCULAR | Status: AC
  Filled 2019-12-17: qty 2

## 2019-12-17 MED ORDER — CEFAZOLIN (ANCEF) 1 G IV SOLR: INTRAVENOUS | Status: AC | PRN

## 2019-12-17 MED ORDER — KETOROLAC TROMETHAMINE 30 MG/ML IJ SOLN
INTRAMUSCULAR | Status: AC
  Filled 2019-12-17: qty 1

## 2019-12-17 MED ORDER — LIDOCAINE HCL (PF) 1 % IJ SOLN
INTRAMUSCULAR | Status: AC
  Administered 2019-12-17: 10 mL
  Filled 2019-12-17: qty 30

## 2019-12-17 MED ORDER — SODIUM CHLORIDE 0.9 % IV SOLN: INTRAVENOUS | Status: DC

## 2019-12-17 MED ORDER — MIDAZOLAM HCL 2 MG/2ML IJ SOLN
INTRAMUSCULAR | Status: AC
  Filled 2019-12-17: qty 2

## 2019-12-17 MED ORDER — HYDROMORPHONE HCL 1 MG/ML IJ SOLN
INTRAMUSCULAR | Status: AC
  Filled 2019-12-17: qty 1

## 2019-12-17 MED ORDER — ONDANSETRON HCL 4 MG/2ML IJ SOLN
INTRAMUSCULAR | Status: AC | PRN
  Administered 2019-12-17: 4 mg via INTRAVENOUS

## 2019-12-17 MED ORDER — HYDROMORPHONE HCL 1 MG/ML IJ SOLN
INTRAMUSCULAR | Status: AC | PRN
  Administered 2019-12-17 (×2): 0.5 mg via INTRAVENOUS

## 2019-12-17 MED ORDER — CEFAZOLIN SODIUM-DEXTROSE 2-4 GM/100ML-% IV SOLN
INTRAVENOUS | Status: AC
  Filled 2019-12-17: qty 100

## 2019-12-17 MED ORDER — IOHEXOL 300 MG/ML  SOLN
50.0000 mL | Freq: Once | INTRAMUSCULAR | Status: AC | PRN
  Administered 2019-12-17: 20 mL

## 2019-12-17 MED ORDER — HYDROMORPHONE HCL 1 MG/ML IJ SOLN: 0.5000 mg | INTRAMUSCULAR | Status: DC | PRN

## 2019-12-17 MED ORDER — KETOROLAC TROMETHAMINE 30 MG/ML IJ SOLN: 30.0000 mg | Freq: Once | INTRAMUSCULAR | Status: AC

## 2019-12-17 MED ORDER — CEFAZOLIN SODIUM-DEXTROSE 2-4 GM/100ML-% IV SOLN
2.0000 g | INTRAVENOUS | Status: AC
  Administered 2019-12-17: 2 g via INTRAVENOUS

## 2019-12-17 NOTE — H&P (Signed)
Chief Complaint: Patient was seen in consultation today for L3 compression fracture   Supervising Physician: Corrie Mckusick  Patient Status: [facility] - Out-pt  History of Present Illness: Tracy Cook is a 64 y.o. female with past medical historyosteoporosis and previous thoracic and lumbar compression fractures, status post cement augmentation at L4 and L5 approximately 2 years ago, and recent kyphoplasty at L2 11/11/19.  She initially did well with improvement in pain at home, however subsequently developed acute pain and repeat imaging 12/02/19 showed a new L3 compression fracture.  She met with Dr. Vernard Gambles 12/09/19 to discuss treatment of her newest fracture.  She elected to proceed with kyphoplasty today.   Tracy Cook presents today in her usual state of health. She states she was performing routine daily tasks at home in mid-May when she developed acute onset back pain.  She is familiar with vertebroplasty/kyphoplasty and would like to proceed with procedure today. She continues to report severe back pain with limited hip mobility due to severity of pain.  She has been NPO.  She does not take blood thinners.   Past Medical History:  Diagnosis Date  . Cardiomyopathy ([place])   . Diverticulosis   . Insomnia   . Menopause   . Migraine   . Orthostatic hypotension   . Osteoporosis   . Other malaise and fatigue   . Persistent disorder of initiating or maintaining sleep   . POTS (postural orthostatic tachycardia syndrome)   . Tachycardia, unspecified     Past Surgical History:  Procedure Laterality Date  . IR KYPHO LUMBAR INC FX REDUCE BONE BX UNI/BIL CANNULATION INC/IMAGING  11/11/2019  . IR RADIOLOGIST EVAL & MGMT  11/05/2019  . IR RADIOLOGIST EVAL & MGMT  12/09/2019  . No prior surgery      Allergies: Alendronate sodium, Codeine, Hydrocodone-acetaminophen, Parathyroid hormone (recomb), and Vicodin [hydrocodone-acetaminophen]  Medications: Prior to Admission medications    Medication Sig Start Date End Date Taking? Authorizing Provider  metroNIDAZOLE (FLAGYL) 500 MG tablet Take 500 mg by mouth 2 (two) times daily. 12/01/19  Yes [provider]  ondansetron (ZOFRAN ODT) 4 MG disintegrating tablet Take 1 tablet (4 mg total) by mouth every 8 (eight) hours as needed for nausea or vomiting. 08/11/19  Yes Blue, Olivia C, PA-C  oxyCODONE-acetaminophen (PERCOCET/ROXICET) 5-325 MG tablet Take 1 tablet by mouth 2 (two) times daily as needed for moderate pain.  10/30/19  Yes [provider]  SUMAtriptan (IMITREX) 100 MG tablet SEE NOTES Patient taking differently: Take 100 mg by mouth every 2 (two) hours as needed for migraine. SEE NOTES 01/06/18  Yes Jaffe, Adam R, DO  tiZANidine (ZANAFLEX) 4 MG tablet Take 4 mg by mouth every 8 (eight) hours as needed for muscle spasms.  05/02/15  Yes [provider]  topiramate (TOPAMAX) 100 MG tablet Take 100 mg by mouth in the morning, at noon, and at bedtime.   Yes [provider]  amphetamine-dextroamphetamine (ADDERALL) 10 MG tablet Take 1 tablet (10 mg total) by mouth 2 (two) times daily. Patient not taking: Reported on 10/20/2019 08/20/19   Carollee Herter, Alferd Apa, DO  aspirin EC 81 MG tablet Take 1 tablet (81 mg total) by mouth daily. Patient not taking: Reported on 12/15/2019 10/21/19   Lelon Perla, MD  metoprolol tartrate (LOPRESSOR) 100 MG tablet TAKE 2 HOURS PRIOR TO CT SCAN Patient not taking: Reported on 12/15/2019 09/16/19   Lelon Perla, MD  propranolol (INDERAL) 20 MG tablet TAKE 1 TABLET (  20 MG TOTAL) BY MOUTH 3 TIMES DAILY. Patient not taking: Reported on 12/15/2019 06/01/19   [name], DO     Family History  Problem Relation Age of Onset  . Coronary artery disease Father        Died of MI at age 41  . Asthma Father   . Hypertension Father   . Sudden death Father   . Heart attack Father   . Stroke Mother   . Diabetes Neg Hx   . Hyperlipidemia Neg Hx     Social  History   Socioeconomic History  . Marital status: Married    Spouse name: Not on file  . Number of children: 2  . Years of education: Not on file  . Highest education level: Not on file  Occupational History    Comment: Medial lab technologist  Tobacco Use  . Smoking status: Never Smoker  . Smokeless tobacco: Never Used  Substance and Sexual Activity  . Alcohol use: No  . Drug use: No  . Sexual activity: Yes    Partners: Male  Other Topics Concern  . Not on file  Social History Narrative   Exercise almost every day   Social Determinants of Health   Financial Resource Strain:   . Difficulty of Paying Living Expenses:   Food Insecurity:   . Worried About Charity fundraiser in the Last Year:   . Arboriculturist in the Last Year:   Transportation Needs:   . Film/video editor (Medical):   Marland Kitchen Lack of Transportation (Non-Medical):   Physical Activity:   . Days of Exercise per Week:   . Minutes of Exercise per Session:   Stress:   . Feeling of Stress :   Social Connections:   . Frequency of Communication with Friends and Family:   . Frequency of Social Gatherings with Friends and Family:   . Attends Religious Services:   . Active Member of Clubs or Organizations:   . Attends Archivist Meetings:   Marland Kitchen Marital Status:      Review of Systems: A 12 point ROS discussed and pertinent positives are indicated in the HPI above.  All other systems are negative.  Review of Systems  Constitutional: Negative for fatigue and fever.  Respiratory: Negative for cough and shortness of breath.   Cardiovascular: Negative for chest pain.  Gastrointestinal: Negative for abdominal pain, diarrhea and nausea.  Genitourinary: Negative for dysuria, frequency and urgency.  Musculoskeletal: Positive for back pain.  Psychiatric/Behavioral: Negative for behavioral problems and confusion.    Vital Signs: BP 101/78   Pulse 81   Temp 98.4 F (36.9 C) (Oral)   Resp 15   Ht _0   (1.753 m)   Wt 125 lb (56.7 kg)   SpO2 100%   BMI 18.46 kg/m   Physical Exam Vitals and nursing note reviewed.  Constitutional:      General: She is not in acute distress.    Appearance: Normal appearance. She is not ill-appearing.  HENT:     Mouth/Throat:     Mouth: Mucous membranes are moist.     Pharynx: Oropharynx is clear.  Cardiovascular:     Rate and Rhythm: Normal rate and regular rhythm.  Pulmonary:     Effort: Pulmonary effort is normal. No respiratory distress.     Breath sounds: Normal breath sounds.  Abdominal:     General: Abdomen is flat. There is no distension.     Palpations:  Abdomen is soft.  Skin:    General: Skin is warm and dry.  Neurological:     General: No focal deficit present.     Mental Status: She is alert and oriented to person, place, and time. Mental status is at baseline.  Psychiatric:        Mood and Affect: Mood normal.        Behavior: Behavior normal.        Thought Content: Thought content normal.        Judgment: Judgment normal.      MD Evaluation Airway: WNL Heart: WNL Abdomen: WNL Chest/ Lungs: WNL ASA  Classification: 3 Mallampati/Airway Score: One   Imaging: DG Lumbar Spine 2-3 Views  Result Date: 12/17/2019 CLINICAL DATA:  History of compression fracture. Preoperative evaluation for kyphoplasty. EXAM: LUMBAR SPINE - 2-3 VIEW COMPARISON:  11/11/2019 FINDINGS: Bones are diffusely demineralized. Patient is status post vertebral augmentation at L2, L4, L5. Loss of vertebral body height at L3 is similar to prior study. SI joints unremarkable. IMPRESSION: 1. Compression deformity at L3 with evidence of prior vertebral augmentation at L2, L4, and L5. Marland Kitchen Electronically Signed   By: Misty Stanley M.D.   On: 12/17/2019 09:00   IR Radiologist Eval & Mgmt  Result Date: 12/09/2019 Please refer to notes tab for details about interventional procedure. (Op Note)   Labs:  CBC: Recent Labs    11/11/19 0839 12/17/19 0718  WBC 4.4  4.9  HGB 13.0 12.6  HCT 39.6 39.6  PLT 270 308    COAGS: Recent Labs    11/11/19 0942 12/17/19 0718  INR 1.0 1.0    BMP: Recent Labs    10/05/19 0936  NA 143  K 4.5  CL 110*  CO2 19*  GLUCOSE 96  BUN 20  CALCIUM 9.8  CREATININE 0.93  GFRNONAA 66  GFRAA 76    LIVER FUNCTION TESTS: No results for input(s): BILITOT, AST, ALT, ALKPHOS, PROT, ALBUMIN in the last 8760 hours.  TUMOR MARKERS: No results for input(s): AFPTM, CEA, CA199, CHROMGRNA in the last 8760 hours.  Assessment and Plan: Patient with past medical history of L3 compression fracture presents with complaint of back pain.   Case reviewed by Dr. Vernard Gambles who approves patient for procedure and has met with her in consultation 12/09/19 to discuss.  Patient presents today in their usual state of health. She elects to proceed.  She has been NPO and is not currently on blood thinners.    Risks and benefits were discussed with the patient including, but not limited to education regarding the natural healing process of compression fractures without intervention, bleeding, infection, cement migration which may cause spinal cord damage, paralysis, pulmonary embolism or even death.  This interventional procedure involves the use of X-rays and because of the nature of the planned procedure, it is possible that we will have prolonged use of X-ray fluoroscopy.  Potential radiation risks to you include (but are not limited to) the following: - A slightly elevated risk for cancer  several years later in life. This risk is typically less than 0.5% percent. This risk is low in comparison to the normal incidence of human cancer, which is 33% for women and 50% for men according to the [employer]. - Radiation induced injury can include skin redness, resembling a rash, tissue breakdown / ulcers and hair loss (which can be temporary or permanent).   The likelihood of either of these occurring depends on the  difficulty of  the procedure and whether you are sensitive to radiation due to previous procedures, disease, or genetic conditions.   IF your procedure requires a prolonged use of radiation, you will be notified and given written instructions for further action.  It is your responsibility to monitor the irradiated area for the 2 weeks following the procedure and to notify your physician if you are concerned that you have suffered a radiation induced injury.    All of the patient's questions were answered, patient is agreeable to proceed.  Consent signed and in chart.  Thank you for this interesting consult.  I greatly enjoyed meeting Tracy Cook and look forward to participating in their care.  A copy of this report was sent to the requesting provider on this date.  Electronically Signed: Docia Barrier, PA 12/17/2019, 9:40 AM   I spent a total of    25 Minutes in face to face in clinical consultation, greater than 50% of which was counseling/coordinating care for L3 compression fracture.

## 2019-12-17 NOTE — Procedures (Signed)
Interventional Radiology Procedure Note  Procedure: Image guided L3 kyphoplasty. Unipedicular, right pedicle.   Complications: None  Recommendations:  - Ok to shower tomorrow - Do not submerge for 7 days - Routine care  - follow up in [place] clinic in 4-6 weeks with Dr. Vernard Gambles or Dr. Earleen Newport   Signed,  Dulcy Fanny. Earleen Newport, DO

## 2019-12-17 NOTE — Discharge Instructions (Signed)
KYPHOPLASTY/VERTEBROPLASTY DISCHARGE INSTRUCTIONS ° °Medications: (check all that apply) ° °   Resume all home medications as before procedure. °                 °  °Continue your pain medications as prescribed as needed.  Over the next 3-5 days, decrease your pain medication as tolerated.  Over the counter medications (i.e. Tylenol, ibuprofen, and aleve) may be substituted once severe/moderate pain symptoms have subsided. ° ° Wound Care: °- Bandages may be removed the day following your procedure.  You may get your incision wet once bandages are removed.  Bandaids may be used to cover the incisions until scab formation.  Topical ointments are optional. ° °- If you develop a fever greater than 101 degrees, have increased skin redness at the incision sites or pus-like oozing from incisions occurring within 1 week of the procedure, contact radiology at [phone] or [phone]. ° °- Ice pack to back for 15-20 minutes 2-3 time per day for first 2-3 days post procedure.  The ice will expedite muscle healing and help with the pain from the incisions. ° ° Activity: °- Bedrest today with limited activity for 24 hours post procedure. ° °- No driving for 48 hours. ° °- Increase your activity as tolerated after bedrest (with assistance if necessary). ° °- Refrain from any strenuous activity or heavy lifting (greater than 10 lbs.). ° ° Follow up: °- Contact radiology at [phone] or [phone] if any questions/concerns. ° °- A physician assistant from radiology will contact you in approximately 1 week. ° °- If a biopsy was performed at the time of your procedure, your referring physician should receive the results in usually 2-3 days. ° ° ° ° ° ° ° ° °

## 2019-12-28 DIAGNOSIS — M810 Age-related osteoporosis without current pathological fracture: Secondary | ICD-10-CM | POA: Diagnosis not present

## 2019-12-28 DIAGNOSIS — M545 Low back pain: Secondary | ICD-10-CM | POA: Diagnosis not present

## 2019-12-28 DIAGNOSIS — M4856XA Collapsed vertebra, not elsewhere classified, lumbar region, initial encounter for fracture: Secondary | ICD-10-CM | POA: Diagnosis not present

## 2020-01-06 ENCOUNTER — Other Ambulatory Visit: Payer: Self-pay | Admitting: Interventional Radiology

## 2020-01-06 DIAGNOSIS — S32000A Wedge compression fracture of unspecified lumbar vertebra, initial encounter for closed fracture: Secondary | ICD-10-CM

## 2020-01-19 ENCOUNTER — Encounter: Payer: BC Managed Care – PPO | Admitting: Family Medicine

## 2020-02-01 ENCOUNTER — Encounter: Payer: Self-pay | Admitting: Family Medicine

## 2020-02-01 ENCOUNTER — Other Ambulatory Visit (HOSPITAL_COMMUNITY)
Admission: RE | Admit: 2020-02-01 | Discharge: 2020-02-01 | Disposition: A | Discharge status: Home / Self Care | Payer: BC Managed Care – PPO | Source: Ambulatory Visit | Attending: Family Medicine | Admitting: Family Medicine

## 2020-02-01 ENCOUNTER — Other Ambulatory Visit: Payer: Self-pay

## 2020-02-01 ENCOUNTER — Ambulatory Visit (INDEPENDENT_AMBULATORY_CARE_PROVIDER_SITE_OTHER): Payer: BC Managed Care – PPO | Admitting: Family Medicine

## 2020-02-01 VITALS — BP 98/70 | HR 101 | Temp 97.8°F | Resp 18 | Ht 69.0 in | Wt 118.8 lb

## 2020-02-01 DIAGNOSIS — Z0000 Encounter for general adult medical examination without abnormal findings: Secondary | ICD-10-CM | POA: Diagnosis not present

## 2020-02-01 DIAGNOSIS — R634 Abnormal weight loss: Secondary | ICD-10-CM | POA: Diagnosis not present

## 2020-02-01 DIAGNOSIS — Z8719 Personal history of other diseases of the digestive system: Secondary | ICD-10-CM | POA: Diagnosis not present

## 2020-02-01 NOTE — Patient Instructions (Signed)

## 2020-02-01 NOTE — Progress Notes (Signed)
Subjective:       Tracy Cook is a 64 y.o. female and is here for a comprehensive physical exam. The patient reports problems - recent surgery R hand due to fracture.   She also c/o weight loss and is overdue for her colonoscopy.    Social History   Socioeconomic History  . Marital status: Married    Spouse name: Not on file  . Number of children: 2  . Years of education: Not on file  . Highest education level: Not on file  Occupational History    Comment: Medial lab technologist  Tobacco Use  . Smoking status: Never Smoker  . Smokeless tobacco: Never Used  Substance and Sexual Activity  . Alcohol use: No  . Drug use: No  . Sexual activity: Yes    Partners: Male  Other Topics Concern  . Not on file  Social History Narrative   Exercise almost every day   Social Determinants of Health   Financial Resource Strain:   . Difficulty of Paying Living Expenses:   Food Insecurity:   . Worried About Charity fundraiser in the Last Year:   . Arboriculturist in the Last Year:   Transportation Needs:   . Film/video editor (Medical):   Marland Kitchen Lack of Transportation (Non-Medical):   Physical Activity:   . Days of Exercise per Week:   . Minutes of Exercise per Session:   Stress:   . Feeling of Stress :   Social Connections:   . Frequency of Communication with Friends and Family:   . Frequency of Social Gatherings with Friends and Family:   . Attends Religious Services:   . Active Member of Clubs or Organizations:   . Attends Archivist Meetings:   Marland Kitchen Marital Status:   Intimate Partner Violence:   . Fear of Current or Ex-Partner:   . Emotionally Abused:   Marland Kitchen Physically Abused:   . Sexually Abused:    Health Maintenance  Topic Date Due  . COLONOSCOPY  04/10/2015  . MAMMOGRAM  11/25/2019  . INFLUENZA VACCINE  02/21/2020  . TETANUS/TDAP  05/08/2022  . PAP SMEAR-Modifier  02/01/2023  . COVID-19 Vaccine  Completed  . Hepatitis C Screening  Completed  . HIV  Screening  Addressed    The following portions of the patient's history were reviewed and updated as appropriate:  She  has a past medical history of Cardiomyopathy ([place]), Diverticulosis, Insomnia, Menopause, Migraine, Orthostatic hypotension, Osteoporosis, Other malaise and fatigue, Persistent disorder of initiating or maintaining sleep, POTS (postural orthostatic tachycardia syndrome), and Tachycardia, unspecified. She does not have any pertinent problems on file. She  has a past surgical history that includes No prior surgery; IR Radiologist Eval & Mgmt (11/05/2019); IR KYPHO LUMBAR INC FX REDUCE BONE BX UNI/BIL CANNULATION INC/IMAGING (11/11/2019); IR Radiologist Eval & Mgmt (12/09/2019); and IR KYPHO LUMBAR INC FX REDUCE BONE BX UNI/BIL CANNULATION INC/IMAGING (12/17/2019). Her family history includes Asthma in her father; Coronary artery disease in her father; Heart attack in her father; Hypertension in her father; Stroke in her mother; Sudden death in her father. She  reports that she has never smoked. She has never used smokeless tobacco. She reports that she does not drink alcohol and does not use drugs. She has a current medication list which includes the following prescription(s): ondansetron, oxycodone-acetaminophen, sumatriptan, tizanidine, topiramate, and aspirin ec. Current Outpatient Medications on File Prior to Visit  Medication Sig Dispense Refill  . ondansetron (ZOFRAN ODT)  4 MG disintegrating tablet Take 1 tablet (4 mg total) by mouth every 8 (eight) hours as needed for nausea or vomiting. 20 tablet 0  . oxyCODONE-acetaminophen (PERCOCET/ROXICET) 5-325 MG tablet Take 1 tablet by mouth 2 (two) times daily as needed for moderate pain.     . SUMAtriptan (IMITREX) 100 MG tablet SEE NOTES (Patient taking differently: Take 100 mg by mouth every 2 (two) hours as needed for migraine. SEE NOTES) 12 tablet 1  . tiZANidine (ZANAFLEX) 4 MG tablet Take 4 mg by mouth every 8 (eight) hours as needed  for muscle spasms.     Marland Kitchen topiramate (TOPAMAX) 100 MG tablet Take 100 mg by mouth in the morning, at noon, and at bedtime.    Marland Kitchen aspirin EC 81 MG tablet Take 1 tablet (81 mg total) by mouth daily. (Patient not taking: Reported on 12/15/2019) 90 tablet 3   No current facility-administered medications on file prior to visit.   She is allergic to alendronate sodium, codeine, hydrocodone-acetaminophen, parathyroid hormone (recomb), and vicodin [hydrocodone-acetaminophen]..  Review of Systems Review of Systems  Constitutional: Negative for activity change, appetite change and fatigue.  HENT: Negative for hearing loss, congestion, tinnitus and ear discharge.  dentist q3m Eyes: Negative for visual disturbance (see optho q1y -- vision corrected to 20/20 with glasses).  Respiratory: Negative for cough, chest tightness and shortness of breath.   Cardiovascular: Negative for chest pain, palpitations and leg swelling.  Gastrointestinal: Negative for abdominal pain, diarrhea, constipation and abdominal distention.  Genitourinary: Negative for urgency, frequency, decreased urine volume and difficulty urinating.  Musculoskeletal: Negative for back pain,  and gait problem.  Skin: Negative for color change, pallor and rash.  Neurological: Negative for dizziness, light-headedness, numbness and headaches.  Hematological: Negative for adenopathy. Does not bruise/bleed easily.  Psychiatric/Behavioral: Negative for suicidal ideas, confusion, sleep disturbance, self-injury, dysphoric mood, decreased concentration and agitation.       Objective:    BP 98/70 (BP Location: Right Arm, Patient Position: Sitting, Cuff Size: Normal)   Pulse (!) 101   Temp 97.8 F (36.6 C) (Temporal)   Resp 18   Ht 5\' 9"  (1.753 m)   Wt 118 lb 12.8 oz (53.9 kg)   SpO2 99%   BMI 17.54 kg/m  General appearance: alert, cooperative, appears stated age and no distress Head: Normocephalic, without obvious abnormality,  atraumatic Eyes: conjunctivae/corneas clear. PERRL, EOM's intact. Fundi benign. Ears: normal TM's and external ear canals both ears Neck: no adenopathy, no carotid bruit, no JVD, supple, symmetrical, trachea midline and thyroid not enlarged, symmetric, no tenderness/mass/nodules Back: symmetric, no curvature. ROM normal. No CVA tenderness. Lungs: clear to auscultation bilaterally Breasts: normal appearance, no masses or tenderness Heart: regular rate and rhythm, S1, S2 normal, no murmur, click, rub or gallop Abdomen: soft, non-tender; bowel sounds normal; no masses,  no organomegaly Pelvic: cervix normal in appearance, external genitalia normal, no adnexal masses or tenderness, no cervical motion tenderness, rectovaginal septum normal, uterus normal size, shape, and consistency, vagina normal without discharge and pap done--rectal -- heme neg brown stool Extremities: extremities normal, atraumatic, no cyanosis or edema Pulses: 2+ and symmetric Skin: Skin color, texture, turgor normal. No rashes or lesions Lymph nodes: Cervical, supraclavicular, and axillary nodes normal. Neurologic: Alert and oriented X 3, normal strength and tone. Normal symmetric reflexes. Normal coordination and gait    Assessment:    Healthy female exam.      Plan:    ghm utd Check labs See After Visit Summary for Counseling  Recommendations    1. Preventative health care See above  - Cytology - PAP( [place]) - Ambulatory referral to Gastroenterology - Lipid panel; Future - CBC with Differential/Platelet; Future - TSH; Future - CBC with Differential/Platelet; Future  2. Weight loss Overdue for colonoscopy and has lost weight  - Ambulatory referral to Gastroenterology - Lipid panel; Future - CBC with Differential/Platelet; Future - TSH; Future - CBC with Differential/Platelet; Future  3. History of diverticulitis   - Ambulatory referral to Gastroenterology

## 2020-02-03 ENCOUNTER — Telehealth: Payer: Self-pay | Admitting: Gastroenterology

## 2020-02-03 ENCOUNTER — Other Ambulatory Visit: Payer: BC Managed Care – PPO

## 2020-02-03 LAB — CYTOLOGY - PAP: Diagnosis: NEGATIVE

## 2020-02-03 NOTE — Telephone Encounter (Signed)
Received previous colon report from Dr. Lorie Apley office and going to place on Dr. Vivia Ewing desk for review

## 2020-02-04 NOTE — Telephone Encounter (Signed)
Received previous colonoscopy report from Dr. Collene Mares, completed 04/09/2012: 2 polyps resected from transverse colon (tubular adenomas), 2 small polyps resected from left colon (hyperplastic polyps).  Extensive melanosis coli.  Fair prep.  Recommended repeat in 3 years due to suboptimal bowel preparation.  Okay to schedule direct access colonoscopy.  Recommend extended 2-day bowel preparation.

## 2020-02-05 ENCOUNTER — Ambulatory Visit: Payer: BC Managed Care – PPO | Admitting: Endocrinology

## 2020-02-05 ENCOUNTER — Encounter: Payer: Self-pay | Admitting: Endocrinology

## 2020-02-05 ENCOUNTER — Other Ambulatory Visit: Payer: Self-pay

## 2020-02-05 ENCOUNTER — Other Ambulatory Visit (INDEPENDENT_AMBULATORY_CARE_PROVIDER_SITE_OTHER): Payer: BC Managed Care – PPO

## 2020-02-05 VITALS — BP 118/74 | HR 69 | Ht 69.0 in | Wt 120.0 lb

## 2020-02-05 DIAGNOSIS — M810 Age-related osteoporosis without current pathological fracture: Secondary | ICD-10-CM

## 2020-02-05 DIAGNOSIS — Z0000 Encounter for general adult medical examination without abnormal findings: Secondary | ICD-10-CM

## 2020-02-05 DIAGNOSIS — R634 Abnormal weight loss: Secondary | ICD-10-CM

## 2020-02-05 LAB — CBC WITH DIFFERENTIAL/PLATELET
Basophils Absolute: 0.1 10*3/uL (ref 0.0–0.1)
Basophils Relative: 2 % (ref 0.0–3.0)
Eosinophils Absolute: 0.5 10*3/uL (ref 0.0–0.7)
Eosinophils Relative: 9 % — ABNORMAL HIGH (ref 0.0–5.0)
HCT: 38.7 % (ref 36.0–46.0)
Hemoglobin: 12.7 g/dL (ref 12.0–15.0)
Lymphocytes Relative: 29.5 % (ref 12.0–46.0)
Lymphs Abs: 1.7 10*3/uL (ref 0.7–4.0)
MCHC: 32.8 g/dL (ref 30.0–36.0)
MCV: 92.2 fl (ref 78.0–100.0)
Monocytes Absolute: 0.4 10*3/uL (ref 0.1–1.0)
Monocytes Relative: 7.9 % (ref 3.0–12.0)
Neutro Abs: 2.9 10*3/uL (ref 1.4–7.7)
Neutrophils Relative %: 51.6 % (ref 43.0–77.0)
Platelets: 239 10*3/uL (ref 150.0–400.0)
RBC: 4.2 Mil/uL (ref 3.87–5.11)
RDW: 13.7 % (ref 11.5–15.5)
WBC: 5.6 10*3/uL (ref 4.0–10.5)

## 2020-02-05 LAB — LIPID PANEL
Cholesterol: 149 mg/dL (ref 0–200)
HDL: 63.6 mg/dL (ref >39.00)
LDL Cholesterol: 66 mg/dL (ref 0–99)
NonHDL: 85.76
Total CHOL/HDL Ratio: 2
Triglycerides: 98 mg/dL (ref 0.0–149.0)
VLDL: 19.6 mg/dL (ref 0.0–40.0)

## 2020-02-05 LAB — TSH: TSH: 0.86 u[IU]/mL (ref 0.35–4.50)

## 2020-02-05 MED ORDER — CALCITONIN (SALMON) 200 UNIT/ACT NA SOLN: 1.0000 | Freq: Every day | NASAL | 12 refills | Status: DC

## 2020-02-05 MED ORDER — RALOXIFENE HCL 60 MG PO TABS: 60.0000 mg | ORAL_TABLET | Freq: Every day | ORAL | 11 refills | Status: DC

## 2020-02-05 NOTE — Progress Notes (Signed)
Subjective:    Patient ID: Tracy Cook, female    DOB: June 13, 1956, 64 y.o.   MRN: [id number]  HPI Pt is referred by Dr Nelva Bush, for osteoporosis.  Pt was noted to have osteoporosis in 2016.  She took Fosamax x a few mos in 2016.  she stopped due to severe rash.  She did not tolerate Prolia (flu like sxs) or teriperatide (headache).  she has had these bony fractures as an adult: vertebral compression fxs and left index finger.  She has no history of any of the following: early menopause, multiple myeloma, renal dz, thyroid problems, prolonged bedrest, alcoholism, smoking, liver dz, Vit-D deficiency, primary hyperparathyroidism.  She does not take heparin or anticonvulsants.  She has had just a few steroid injections into the spine.  She takes Vit-D, at an uncertain dosage.  Main symptom is back pain.  She is tapering off Topirimate.  She has been on calcitonin nasal spray x 6 weeks--no side effects.   Past Medical History:  Diagnosis Date  . Cardiomyopathy ([place])   . Diverticulosis   . Insomnia   . Menopause   . Migraine   . Orthostatic hypotension   . Osteoporosis   . Other malaise and fatigue   . Persistent disorder of initiating or maintaining sleep   . POTS (postural orthostatic tachycardia syndrome)   . Tachycardia, unspecified     Past Surgical History:  Procedure Laterality Date  . IR KYPHO LUMBAR INC FX REDUCE BONE BX UNI/BIL CANNULATION INC/IMAGING  11/11/2019  . IR KYPHO LUMBAR INC FX REDUCE BONE BX UNI/BIL CANNULATION INC/IMAGING  12/17/2019  . IR RADIOLOGIST EVAL & MGMT  11/05/2019  . IR RADIOLOGIST EVAL & MGMT  12/09/2019  . No prior surgery      Social History   Socioeconomic History  . Marital status: Married    Spouse name: Not on file  . Number of children: 2  . Years of education: Not on file  . Highest education level: Not on file  Occupational History    Comment: Medial lab technologist  Tobacco Use  . Smoking status: Never Smoker  . Smokeless tobacco:  Never Used  Substance and Sexual Activity  . Alcohol use: No  . Drug use: No  . Sexual activity: Yes    Partners: Male  Other Topics Concern  . Not on file  Social History Narrative   Exercise almost every day   Social Determinants of Health   Financial Resource Strain:   . Difficulty of Paying Living Expenses:   Food Insecurity:   . Worried About Charity fundraiser in the Last Year:   . Arboriculturist in the Last Year:   Transportation Needs:   . Film/video editor (Medical):   Marland Kitchen Lack of Transportation (Non-Medical):   Physical Activity:   . Days of Exercise per Week:   . Minutes of Exercise per Session:   Stress:   . Feeling of Stress :   Social Connections:   . Frequency of Communication with Friends and Family:   . Frequency of Social Gatherings with Friends and Family:   . Attends Religious Services:   . Active Member of Clubs or Organizations:   . Attends Archivist Meetings:   Marland Kitchen Marital Status:   Intimate Partner Violence:   . Fear of Current or Ex-Partner:   . Emotionally Abused:   Marland Kitchen Physically Abused:   . Sexually Abused:     Current Outpatient Medications on File  Prior to Visit  Medication Sig Dispense Refill  . aspirin EC 81 MG tablet Take 1 tablet (81 mg total) by mouth daily. 90 tablet 3  . ondansetron (ZOFRAN ODT) 4 MG disintegrating tablet Take 1 tablet (4 mg total) by mouth every 8 (eight) hours as needed for nausea or vomiting. 20 tablet 0  . oxyCODONE-acetaminophen (PERCOCET/ROXICET) 5-325 MG tablet Take 1 tablet by mouth 2 (two) times daily as needed for moderate pain.     . SUMAtriptan (IMITREX) 100 MG tablet SEE NOTES (Patient taking differently: Take 100 mg by mouth every 2 (two) hours as needed for migraine. SEE NOTES) 12 tablet 1  . tiZANidine (ZANAFLEX) 4 MG tablet Take 4 mg by mouth every 8 (eight) hours as needed for muscle spasms.     Marland Kitchen topiramate (TOPAMAX) 100 MG tablet Take 100 mg by mouth in the morning, at noon, and at  bedtime.     No current facility-administered medications on file prior to visit.    Allergies  Allergen Reactions  . Alendronate Sodium Rash  . Codeine Nausea And Vomiting  . Hydrocodone-Acetaminophen Nausea And Vomiting    Can take with zofran   . Parathyroid Hormone (Recomb) Other (See Comments)    Bone pain and headaches Bone pain and headaches  . Vicodin [Hydrocodone-Acetaminophen] Nausea And Vomiting    Family History  Problem Relation Age of Onset  . Coronary artery disease Father        Died of MI at age 76  . Asthma Father   . Hypertension Father   . Sudden death Father   . Heart attack Father   . Stroke Mother   . Osteoporosis Mother   . Osteoporosis Sister   . Diabetes Neg Hx   . Hyperlipidemia Neg Hx     BP 118/74 (BP Location: Left Arm, Patient Position: Sitting, Cuff Size: Normal)   Pulse 69   Ht '5\' 9"'  (1.753 m)   Wt 120 lb (54.4 kg)   SpO2 98%   BMI 17.72 kg/m    Review of Systems denies weight loss, heartburn, cold intolerance, falls, muscle cramps, memory loss.       Objective:   Physical Exam VS: see vs page GEN: no distress HEAD: head: no deformity eyes: no periorbital swelling, no proptosis external nose and ears are normal NECK: supple, thyroid is not enlarged CHEST WALL: no deformity.  No kyphosis.   LUNGS: clear to auscultation.   CV: reg rate and rhythm, no murmur.  MUSCULOSKELETAL: muscle bulk and strength are grossly normal.  no obvious joint swelling.  gait is normal and steady.   EXTEMITIES: left index finger deformity is noted.  no leg edema PULSES: no carotid bruit NEURO:  cn 2-12 grossly intact.   readily moves all 4's.  sensation is intact to touch on all 4's.   SKIN:  Normal texture and temperature.  No rash or suspicious lesion is visible.   NODES:  None palpable at the neck PSYCH: alert, well-oriented.  Does not appear anxious nor depressed.    DEXA (2018): worse T-score is -3.2 (spine).   Lab Results  Component  Value Date   CALCIUM 9.8 10/05/2019   Lab Results  Component Value Date   CREATININE 0.93 10/05/2019   BUN 20 10/05/2019   NA 143 10/05/2019   K 4.5 10/05/2019   CL 110 (H) 10/05/2019   CO2 19 (L) 10/05/2019   I have reviewed outside records, and summarized: Pt was noted to have osteoporosis, and  referred here.  Wellness and weight loss were also addressed.       Assessment & Plan:  Osteoporosis, new to me therapy is limited by multiple drug intolerances: This limits rx options.    Patient Instructions  Blood tests are requested for you today.  We'll let you know about the result. Please continue the same calcitonin. I have sent a prescription to your pharmacy, to add "raloxifene." Let's recheck the bone density. Please come back for a follow-up appointment in 1 year.

## 2020-02-05 NOTE — Patient Instructions (Signed)
Blood tests are requested for you today.  We'll let you know about the result. Please continue the same calcitonin. I have sent a prescription to your pharmacy, to add "raloxifene." Let's recheck the bone density. Please come back for a follow-up appointment in 1 year.

## 2020-02-08 LAB — PROTEIN ELECTROPHORESIS, SERUM
Albumin ELP: 4.5 g/dL (ref 3.8–4.8)
Alpha 1: 0.3 g/dL (ref 0.2–0.3)
Alpha 2: 0.6 g/dL (ref 0.5–0.9)
Beta 2: 0.3 g/dL (ref 0.2–0.5)
Beta Globulin: 0.5 g/dL (ref 0.4–0.6)
Gamma Globulin: 0.9 g/dL (ref 0.8–1.7)
Total Protein: 7 g/dL (ref 6.1–8.1)

## 2020-02-08 LAB — VITAMIN D 25 HYDROXY (VIT D DEFICIENCY, FRACTURES): Vit D, 25-Hydroxy: 40 ng/mL (ref 30–100)

## 2020-02-08 LAB — PTH, INTACT AND CALCIUM
Calcium: 9.8 mg/dL (ref 8.6–10.4)
PTH: 24 pg/mL (ref 14–64)

## 2020-02-17 DIAGNOSIS — G894 Chronic pain syndrome: Secondary | ICD-10-CM | POA: Diagnosis not present

## 2020-02-17 DIAGNOSIS — R110 Nausea: Secondary | ICD-10-CM | POA: Diagnosis not present

## 2020-02-18 ENCOUNTER — Other Ambulatory Visit: Payer: Self-pay

## 2020-02-18 DIAGNOSIS — Z1211 Encounter for screening for malignant neoplasm of colon: Secondary | ICD-10-CM

## 2020-02-18 DIAGNOSIS — Z86010 Personal history of colonic polyps: Secondary | ICD-10-CM

## 2020-02-24 ENCOUNTER — Ambulatory Visit (AMBULATORY_SURGERY_CENTER): Payer: Self-pay

## 2020-02-24 ENCOUNTER — Encounter: Payer: Self-pay | Admitting: Gastroenterology

## 2020-02-24 ENCOUNTER — Other Ambulatory Visit: Payer: Self-pay

## 2020-02-24 VITALS — Ht 69.0 in | Wt 123.4 lb

## 2020-02-24 DIAGNOSIS — Z86010 Personal history of colonic polyps: Secondary | ICD-10-CM

## 2020-02-24 MED ORDER — SUTAB 1479-225-188 MG PO TABS: 1.0000 | ORAL_TABLET | ORAL | 0 refills | Status: DC

## 2020-02-24 NOTE — Progress Notes (Signed)
No egg or soy allergy known to patient  No issues with past sedation with any surgeries or procedures No intubation problems in the past  No FH of Malignant Hyperthermia No diet pills per patient No home 02 use per patient  No blood thinners per patient  Pt reports issues with constipation - will take Miralax for 5 days prior to procedure; patient reports she will take Miralax twice daily (for 5 days prior to procedure) in place of taking Dulcolax tabs;  No A fib or A flutter  EMMI video to [place] 19 guidelines implemented in [facility] today with Pt and RN   Coupon given to pt in [facility] today, Code to Pharmacy  COVID vaccines completed on 12/2019 per pt; Due to the COVID-19 pandemic we are asking patients to follow these guidelines. Please only bring one care partner. Please be aware that your care partner may wait in the car in the parking lot or if they feel like they will be too hot to wait in the car, they may wait in the lobby on the 4th floor. All care partners are required to wear a mask the entire time (we do not have any that we can provide them), they need to practice social distancing, and we will do a Covid check for all patient's and care partners when you arrive. Also we will check their temperature and your temperature. If the care partner waits in their car they need to stay in the parking lot the entire time and we will call them on their cell phone when the patient is ready for discharge so they can bring the car to the front of the building. Also all patient's will need to wear a mask into building.

## 2020-02-26 ENCOUNTER — Ambulatory Visit: Payer: BC Managed Care – PPO | Admitting: Endocrinology

## 2020-03-01 DIAGNOSIS — M25551 Pain in right hip: Secondary | ICD-10-CM | POA: Diagnosis not present

## 2020-03-01 DIAGNOSIS — M25571 Pain in right ankle and joints of right foot: Secondary | ICD-10-CM | POA: Diagnosis not present

## 2020-03-01 DIAGNOSIS — M25531 Pain in right wrist: Secondary | ICD-10-CM | POA: Diagnosis not present

## 2020-03-08 DIAGNOSIS — M25531 Pain in right wrist: Secondary | ICD-10-CM | POA: Diagnosis not present

## 2020-03-08 DIAGNOSIS — M25551 Pain in right hip: Secondary | ICD-10-CM | POA: Diagnosis not present

## 2020-03-10 ENCOUNTER — Encounter: Payer: BC Managed Care – PPO | Admitting: Gastroenterology

## 2020-03-10 DIAGNOSIS — M25551 Pain in right hip: Secondary | ICD-10-CM | POA: Diagnosis not present

## 2020-03-11 DIAGNOSIS — S32591A Other specified fracture of right pubis, initial encounter for closed fracture: Secondary | ICD-10-CM | POA: Diagnosis not present

## 2020-03-25 ENCOUNTER — Ambulatory Visit (AMBULATORY_SURGERY_CENTER): Payer: BC Managed Care – PPO | Admitting: Gastroenterology

## 2020-03-25 ENCOUNTER — Other Ambulatory Visit: Payer: Self-pay

## 2020-03-25 ENCOUNTER — Encounter: Payer: Self-pay | Admitting: Gastroenterology

## 2020-03-25 VITALS — BP 112/72 | HR 77 | Temp 97.1°F | Resp 15 | Ht 69.0 in | Wt 123.4 lb

## 2020-03-25 DIAGNOSIS — D123 Benign neoplasm of transverse colon: Secondary | ICD-10-CM | POA: Diagnosis not present

## 2020-03-25 DIAGNOSIS — D125 Benign neoplasm of sigmoid colon: Secondary | ICD-10-CM

## 2020-03-25 DIAGNOSIS — K642 Third degree hemorrhoids: Secondary | ICD-10-CM

## 2020-03-25 DIAGNOSIS — Z86010 Personal history of colonic polyps: Secondary | ICD-10-CM

## 2020-03-25 DIAGNOSIS — D120 Benign neoplasm of cecum: Secondary | ICD-10-CM

## 2020-03-25 DIAGNOSIS — D124 Benign neoplasm of descending colon: Secondary | ICD-10-CM | POA: Diagnosis not present

## 2020-03-25 DIAGNOSIS — Z1211 Encounter for screening for malignant neoplasm of colon: Secondary | ICD-10-CM | POA: Diagnosis not present

## 2020-03-25 MED ORDER — SODIUM CHLORIDE 0.9 % IV SOLN: 500.0000 mL | Freq: Once | INTRAVENOUS | Status: DC

## 2020-03-25 NOTE — Op Note (Signed)
[facility] Patient Name: Tracy Cook Procedure Date: 03/25/2020 10:55 AM MRN: [id number] Endoscopist: Gerrit Heck , MD Age: 64 Referring MD:  Date of Birth: 1956-02-05 Gender: Female Account #: [id number] Procedure:                Colonoscopy Indications:              Surveillance: Personal history of adenomatous                            polyps on last colonoscopy > 5 years ago Medicines:                Monitored Anesthesia Care Procedure:                Pre-Anesthesia Assessment:                           - Prior to the procedure, a History and Physical                            was performed, and patient medications and                            allergies were reviewed. The patient's tolerance of                            previous anesthesia was also reviewed. The risks                            and benefits of the procedure and the sedation                            options and risks were discussed with the patient.                            All questions were answered, and informed consent                            was obtained. Prior Anticoagulants: The patient has                            taken no previous anticoagulant or antiplatelet                            agents. ASA Grade Assessment: II - A patient with                            mild systemic disease. After reviewing the risks                            and benefits, the patient was deemed in                            satisfactory condition to undergo the procedure.  After obtaining informed consent, the colonoscope                            was passed under direct vision. Throughout the                            procedure, the patient's blood pressure, pulse, and                            oxygen saturations were monitored continuously. The                            Colonoscope was introduced through the anus and                            advanced to the the  cecum, identified by                            appendiceal orifice and ileocecal valve. The                            colonoscopy was technically difficult and complex                            due to significant looping and a tortuous colon.                            Successful completion of the procedure was aided by                            using manual pressure. The patient tolerated the                            procedure well. The quality of the bowel                            preparation was good. The ileocecal valve,                            appendiceal orifice, and rectum were photographed. Scope In: 11:03:32 AM Scope Out: 11:32:38 AM Total Procedure Duration: 0 hours 29 minutes 6 seconds  Findings:                 Hemorrhoids were found on perianal exam.                           Five sessile polyps were found in the sigmoid colon                            (2), descending colon, transverse colon and cecum.                            The polyps were 3 to 6 mm in size. These polyps  were removed with a cold snare. Resection and                            retrieval were complete. Estimated blood loss was                            minimal.                           The sigmoid colon was significantly tortuous.                            Advancing the scope required using manual pressure.                           The transverse colon and ascending colon revealed                            significantly excessive looping. Advancing the                            scope required using manual pressure.                           Non-bleeding internal hemorrhoids were found during                            retroflexion. The hemorrhoids were medium-sized and                            Grade III (internal hemorrhoids that prolapse but                            require manual reduction). Complications:            No immediate complications. Estimated  Blood Loss:     Estimated blood loss was minimal. Impression:               - Hemorrhoids found on perianal exam.                           - Five 3 to 6 mm polyps in the sigmoid colon, in                            the descending colon, in the transverse colon and                            in the cecum, removed with a cold snare. Resected                            and retrieved.                           - Tortuous colon with significant looping of the  colon.                           - Non-bleeding internal hemorrhoids. Recommendation:           - Patient has a contact number available for                            emergencies. The signs and symptoms of potential                            delayed complications were discussed with the                            patient. Return to normal activities tomorrow.                            Written discharge instructions were provided to the                            patient.                           - Resume previous diet.                           - Continue present medications.                           - Await pathology results.                           - Repeat colonoscopy for surveillance based on                            pathology results.                           - Return to GI clinic PRN.                           - Use fiber, for example Citrucel, Fibercon, Konsyl                            or Metamucil.                           - Internal hemorrhoids were noted on this study and                            may be amenable to hemorrhoid band ligation. If you                            are interested in further treatment of these                            hemorrhoids with band ligation, please contact my  clinic to set up an appointment for evaluation and                            treatment. Gerrit Heck, MD 03/25/2020 11:37:47 AM

## 2020-03-25 NOTE — Progress Notes (Signed)
Pt's states no medical or surgical changes since previsit or office visit. 

## 2020-03-25 NOTE — Progress Notes (Signed)
Called to room to assist during endoscopic procedure.  Patient ID and intended procedure confirmed with present staff. Received instructions for my participation in the procedure from the performing physician.  

## 2020-03-25 NOTE — Patient Instructions (Addendum)
Handout provided on polyps and hemorrhoids and hemorrhoid band ligation.   Use fiber, for example Citrucel, Fibercon, Konsyl or Metamucil.  Internal hemorrhoids were noted on this study and may be amenable to hemorrhoid band ligation. If you are interested in further treatment of these hemorrhoids with band ligation, please contact my clinic to set up an appointment for evaluation and treatment.   YOU HAD AN ENDOSCOPIC PROCEDURE TODAY AT [facility]:   Refer to the procedure report that was given to you for any specific questions about what was found during the examination.  If the procedure report does not answer your questions, please call your gastroenterologist to clarify.  If you requested that your care partner not be given the details of your procedure findings, then the procedure report has been included in a sealed envelope for you to review at your convenience later.  YOU SHOULD EXPECT: Some feelings of bloating in the abdomen. Passage of more gas than usual.  Walking can help get rid of the air that was put into your GI tract during the procedure and reduce the bloating. If you had a lower endoscopy (such as a colonoscopy or flexible sigmoidoscopy) you may notice spotting of blood in your stool or on the toilet paper. If you underwent a bowel prep for your procedure, you may not have a normal bowel movement for a few days.  Please Note:  You might notice some irritation and congestion in your nose or some drainage.  This is from the oxygen used during your procedure.  There is no need for concern and it should clear up in a day or so.  SYMPTOMS TO REPORT IMMEDIATELY:   Following lower endoscopy (colonoscopy or flexible sigmoidoscopy):  Excessive amounts of blood in the stool  Significant tenderness or worsening of abdominal pains  Swelling of the abdomen that is new, acute  Fever of 100F or higher  For urgent or emergent issues, a gastroenterologist can be reached at  any hour by calling [phone]. Do not use MyChart messaging for urgent concerns.    DIET:  We do recommend a small meal at first, but then you may proceed to your regular diet.  Drink plenty of fluids but you should avoid alcoholic beverages for 24 hours.  ACTIVITY:  You should plan to take it easy for the rest of today and you should NOT DRIVE or use heavy machinery until tomorrow (because of the sedation medicines used during the test).    FOLLOW UP: Our staff will call the number listed on your records 48-72 hours following your procedure to check on you and address any questions or concerns that you may have regarding the information given to you following your procedure. If we do not reach you, we will leave a message.  We will attempt to reach you two times.  During this call, we will ask if you have developed any symptoms of COVID 19. If you develop any symptoms (ie: fever, flu-like symptoms, shortness of breath, cough etc.) before then, please call [phone].  If you test positive for Covid 19 in the 2 weeks post procedure, please call and report this information to [place].    If any biopsies were taken you will be contacted by phone or by letter within the next 1-3 weeks.  Please call us at [phone] if you have not heard about the biopsies in 3 weeks.    SIGNATURES/CONFIDENTIALITY: You and/or your care partner have signed paperwork which will  be entered into your electronic medical record.  These signatures attest to the fact that that the information above on your After Visit Summary has been reviewed and is understood.  Full responsibility of the confidentiality of this discharge information lies with you and/or your care-partner.

## 2020-03-25 NOTE — Progress Notes (Signed)
PT taken to PACU. Monitors in place. VSS. Report given to RN. 

## 2020-03-30 ENCOUNTER — Telehealth: Payer: Self-pay

## 2020-03-30 NOTE — Telephone Encounter (Signed)
  Follow up Call-  Call back number 03/25/2020  Post procedure Call Back phone  # [phone]  Permission to leave phone message Yes  Some recent data might be hidden     Patient questions:  Do you have a fever, pain , or abdominal swelling? No. Pain Score  0 *  Have you tolerated food without any problems? Yes.    Have you been able to return to your normal activities? Yes.    Do you have any questions about your discharge instructions: Diet   No. Medications  No. Follow up visit  No.  Do you have questions or concerns about your Care? No.  Actions: * If pain score is 4 or above: 1. No action needed, pain <4.Have you developed a fever since your procedure? no  2.   Have you had an respiratory symptoms (SOB or cough) since your procedure? no  3.   Have you tested positive for COVID 19 since your procedure no  4.   Have you had any family members/close contacts diagnosed with the COVID 19 since your procedure?  no   If yes to any of these questions please route to Joylene John, RN and Joella Prince, RN

## 2020-03-31 ENCOUNTER — Encounter: Payer: Self-pay | Admitting: Gastroenterology

## 2020-04-14 DIAGNOSIS — M25551 Pain in right hip: Secondary | ICD-10-CM | POA: Diagnosis not present

## 2020-04-14 DIAGNOSIS — M79652 Pain in left thigh: Secondary | ICD-10-CM | POA: Diagnosis not present

## 2020-04-14 DIAGNOSIS — M79651 Pain in right thigh: Secondary | ICD-10-CM | POA: Diagnosis not present

## 2020-04-18 DIAGNOSIS — M79651 Pain in right thigh: Secondary | ICD-10-CM | POA: Diagnosis not present

## 2020-04-18 DIAGNOSIS — M25551 Pain in right hip: Secondary | ICD-10-CM | POA: Diagnosis not present

## 2020-04-18 DIAGNOSIS — M79652 Pain in left thigh: Secondary | ICD-10-CM | POA: Diagnosis not present

## 2020-04-22 DIAGNOSIS — M25551 Pain in right hip: Secondary | ICD-10-CM | POA: Diagnosis not present

## 2020-04-22 DIAGNOSIS — M79651 Pain in right thigh: Secondary | ICD-10-CM | POA: Diagnosis not present

## 2020-04-22 DIAGNOSIS — M79652 Pain in left thigh: Secondary | ICD-10-CM | POA: Diagnosis not present

## 2020-04-27 DIAGNOSIS — M79652 Pain in left thigh: Secondary | ICD-10-CM | POA: Diagnosis not present

## 2020-04-27 DIAGNOSIS — M79651 Pain in right thigh: Secondary | ICD-10-CM | POA: Diagnosis not present

## 2020-04-27 DIAGNOSIS — M25551 Pain in right hip: Secondary | ICD-10-CM | POA: Diagnosis not present

## 2020-05-02 DIAGNOSIS — M79651 Pain in right thigh: Secondary | ICD-10-CM | POA: Diagnosis not present

## 2020-05-02 DIAGNOSIS — M79652 Pain in left thigh: Secondary | ICD-10-CM | POA: Diagnosis not present

## 2020-05-02 DIAGNOSIS — M25552 Pain in left hip: Secondary | ICD-10-CM | POA: Diagnosis not present

## 2020-05-02 DIAGNOSIS — M25551 Pain in right hip: Secondary | ICD-10-CM | POA: Diagnosis not present

## 2020-05-04 DIAGNOSIS — M25552 Pain in left hip: Secondary | ICD-10-CM | POA: Diagnosis not present

## 2020-05-04 DIAGNOSIS — M79651 Pain in right thigh: Secondary | ICD-10-CM | POA: Diagnosis not present

## 2020-05-04 DIAGNOSIS — M79652 Pain in left thigh: Secondary | ICD-10-CM | POA: Diagnosis not present

## 2020-05-04 DIAGNOSIS — M25551 Pain in right hip: Secondary | ICD-10-CM | POA: Diagnosis not present

## 2020-05-09 DIAGNOSIS — M79652 Pain in left thigh: Secondary | ICD-10-CM | POA: Diagnosis not present

## 2020-05-09 DIAGNOSIS — M25552 Pain in left hip: Secondary | ICD-10-CM | POA: Diagnosis not present

## 2020-05-09 DIAGNOSIS — M79651 Pain in right thigh: Secondary | ICD-10-CM | POA: Diagnosis not present

## 2020-05-09 DIAGNOSIS — M25551 Pain in right hip: Secondary | ICD-10-CM | POA: Diagnosis not present

## 2020-05-11 DIAGNOSIS — M79652 Pain in left thigh: Secondary | ICD-10-CM | POA: Diagnosis not present

## 2020-05-11 DIAGNOSIS — M79651 Pain in right thigh: Secondary | ICD-10-CM | POA: Diagnosis not present

## 2020-05-11 DIAGNOSIS — M25552 Pain in left hip: Secondary | ICD-10-CM | POA: Diagnosis not present

## 2020-05-11 DIAGNOSIS — M25551 Pain in right hip: Secondary | ICD-10-CM | POA: Diagnosis not present

## 2020-05-12 DIAGNOSIS — M62830 Muscle spasm of back: Secondary | ICD-10-CM | POA: Diagnosis not present

## 2020-05-14 IMAGING — MR ULTRASOUND LEFT BREAST LIMITED
5 of 7 series · 36 of 48 positions shown · non-contrast
Comparison: Previous exam(s).

CLINICAL DATA: 62-year-old patient with palpable area of concern
with tenderness in the 5-6 o'clock region of the left breast. Due
for annual mammogram.

EXAM:
DIGITAL DIAGNOSTIC BILATERAL MAMMOGRAM WITH CAD AND TOMO
ULTRASOUND LEFT BREAST

[Series 3: T2 · sagittal · 4.0mm · 0.81mm/px · 7 of 34 slices shown]
[im 1/34]
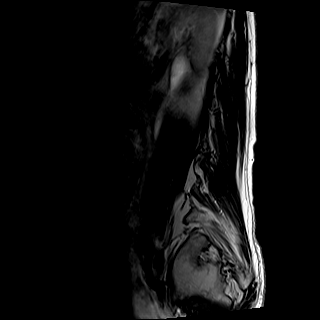
[im 6/34]
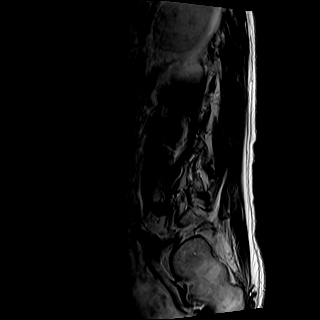
[im 12/34]
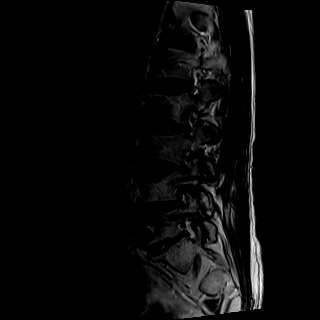
[im 17/34]
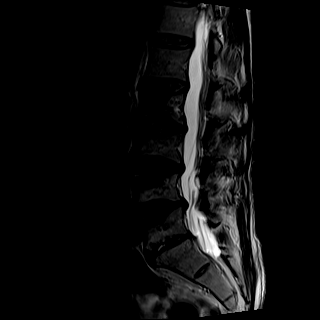
[im 23/34]
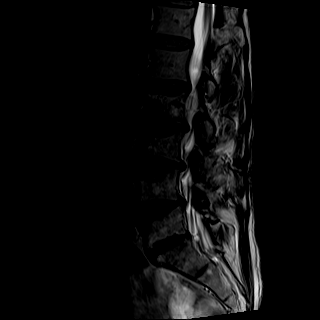
[im 28/34]
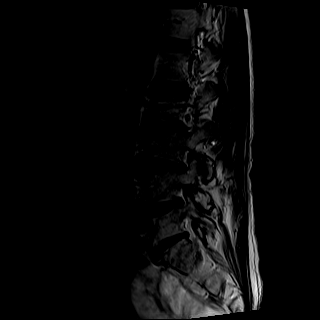
[im 34/34]
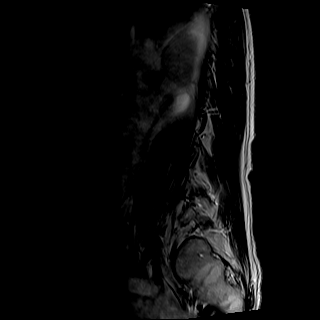

[Series 4: T1 · sagittal · 4.0mm · 0.81mm/px · 6 of 34 slices shown]
[im 1/34]
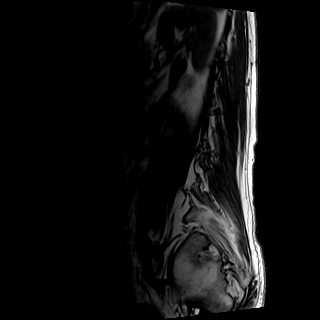
[im 7/34]
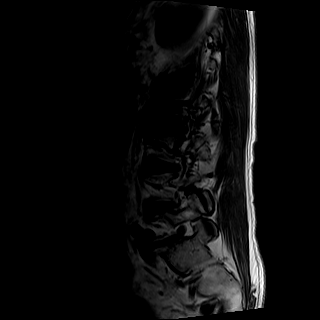
[im 14/34]
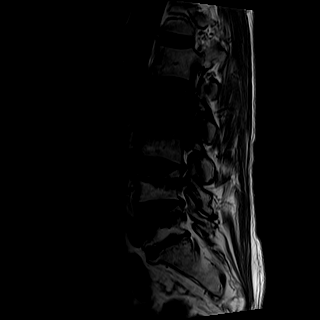
[im 20/34]
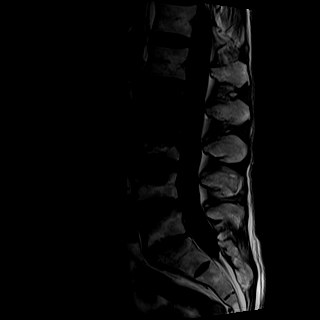
[im 27/34]
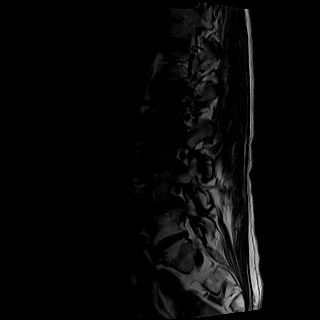
[im 34/34]
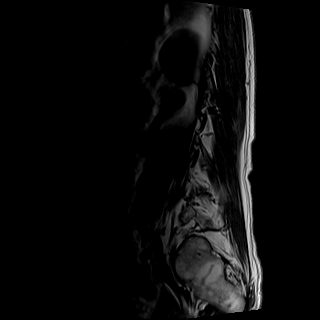

[Series 5: STIR · sagittal · 4.0mm · 0.51mm/px · 7 of 36 slices shown]
[im 1/36]
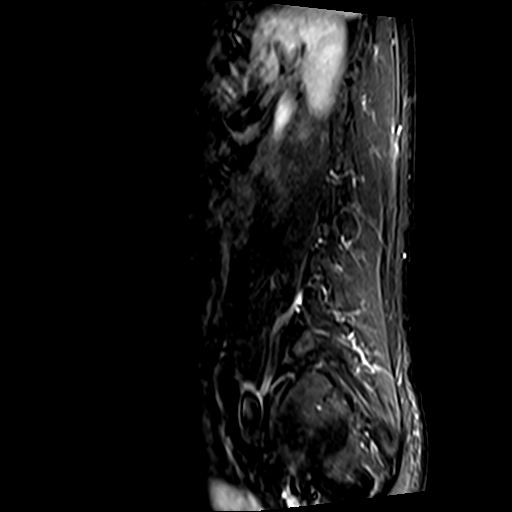
[im 6/36]
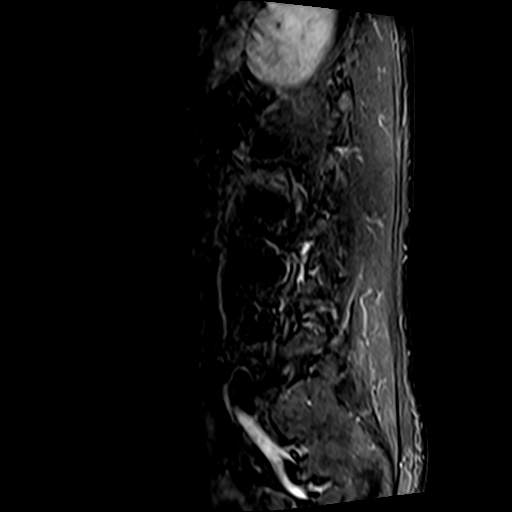
[im 12/36]
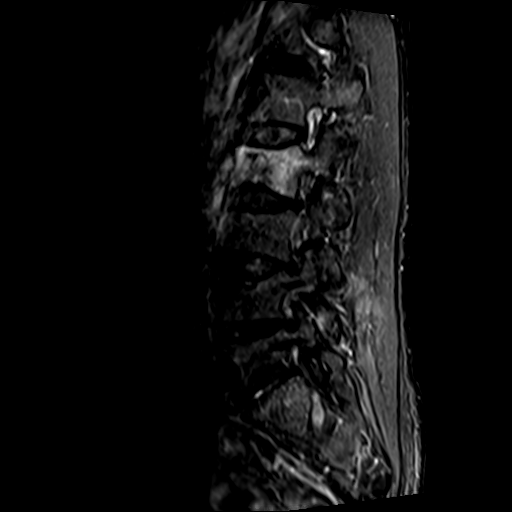
[im 18/36]
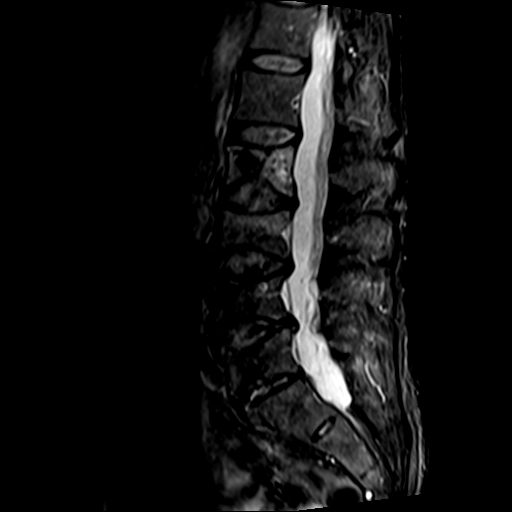
[im 24/36]
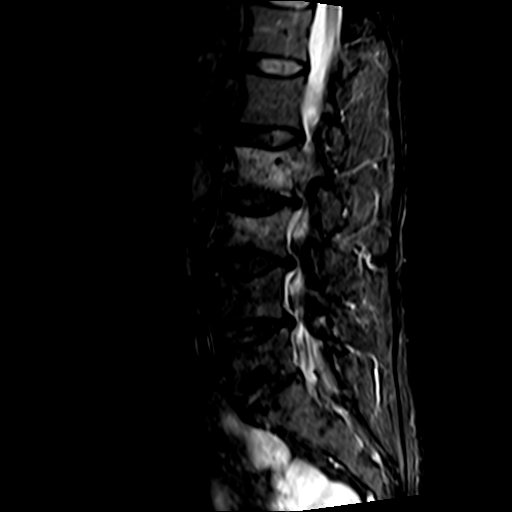
[im 30/36]
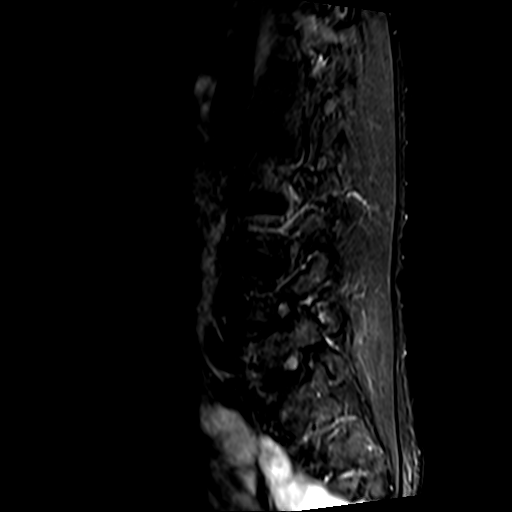
[im 36/36]
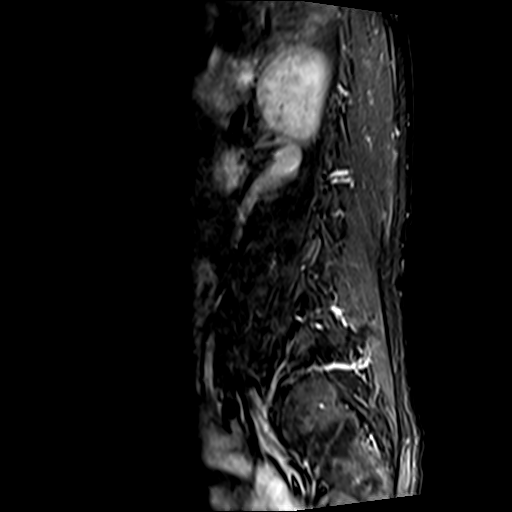

[Series 6: t2_tse_tra_(stack) · axial · 4.0mm · 0.69mm/px · z∈[-79,+116]mm · 8 of 78 slices shown]
[im 1/78]
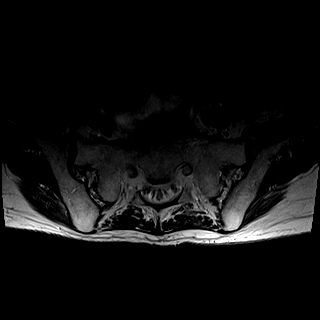
[im 12/78]
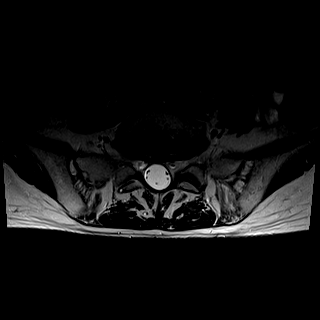
[im 24/78]
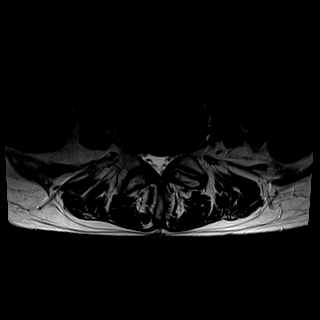
[im 36/78]
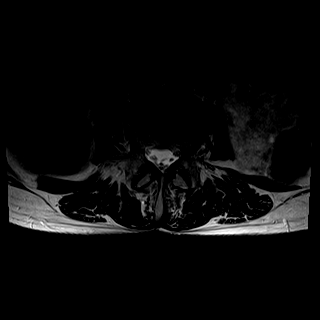
[im 42/78]
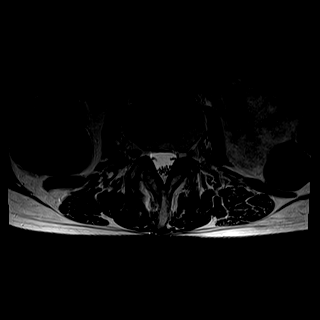
[im 54/78]
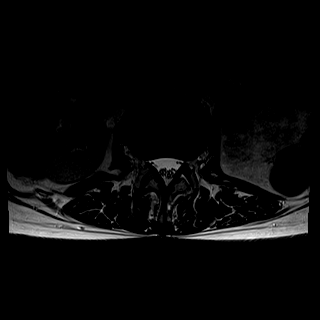
[im 66/78]
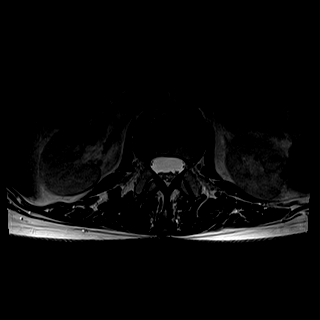
[im 78/78]
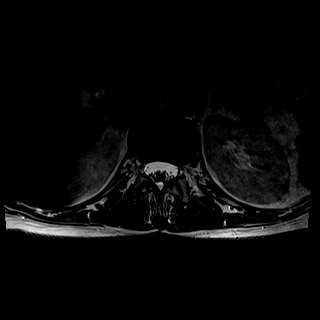

[Series 7: t1_tse_tra (discs) · axial · 4.0mm · 0.69mm/px · z∈[-107,+122]mm · 8 of 56 slices shown]
[im 1/56]
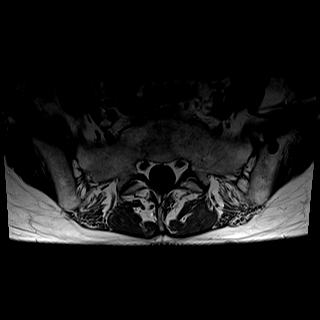
[im 7/56]
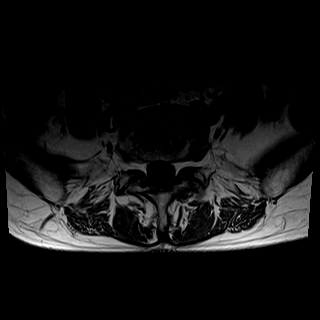
[im 19/56]
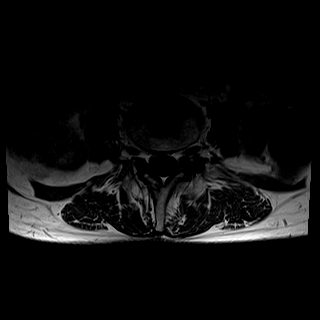
[im 25/56]
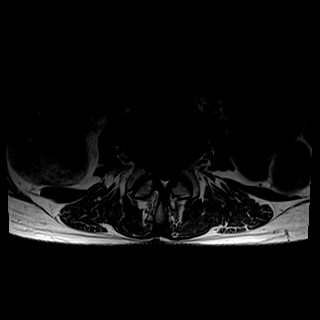
[im 31/56]
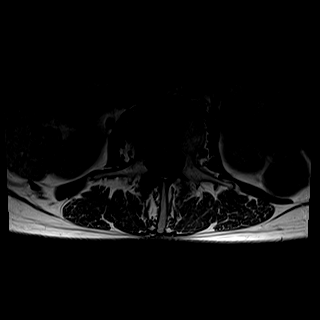
[im 37/56]
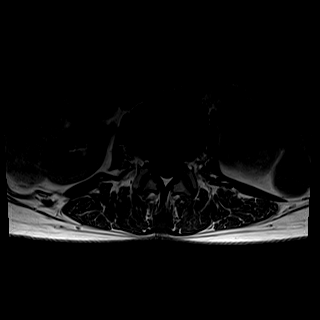
[im 49/56]
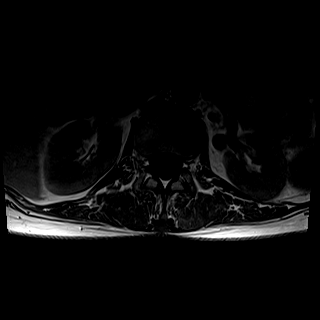
[im 56/56]
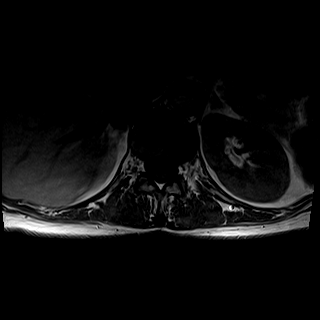

[36 of 48 positions shown; findings below may reference images not displayed]

ACR Breast Density Category c: The breast tissue is heterogeneously
dense, which may obscure small masses.
FINDINGS: No mass, architectural distortion, or suspicious microcalcification
is identified to suggest malignancy in either breast. Spot
tangential compression view of the region of patient concern in the
lower outer left breast is negative.

Mammographic images were processed with CAD.

On physical exam, I palpate soft breast parenchyma in the inferior
left breast in the region of recent patient concern. No suspicious
mass is palpated. The skin appears normal.

Targeted ultrasound is performed, showing normal breast parenchyma
in the lower outer quadrant of the left breast. No evidence of
malignancy.
IMPRESSION: No evidence of malignancy in either breast.

RECOMMENDATION:
Screening mammogram in one year.(Code:J9-B-TEG)

I have discussed the findings and recommendations with the patient.
Results were also provided in writing at the conclusion of the
visit. If applicable, a reminder letter will be sent to the patient
regarding the next appointment.

BI-RADS CATEGORY  1: Negative.

## 2020-05-16 DIAGNOSIS — G894 Chronic pain syndrome: Secondary | ICD-10-CM | POA: Diagnosis not present

## 2020-05-16 DIAGNOSIS — M62830 Muscle spasm of back: Secondary | ICD-10-CM | POA: Diagnosis not present

## 2020-05-20 DIAGNOSIS — M25552 Pain in left hip: Secondary | ICD-10-CM | POA: Diagnosis not present

## 2020-05-20 DIAGNOSIS — M25551 Pain in right hip: Secondary | ICD-10-CM | POA: Diagnosis not present

## 2020-05-20 DIAGNOSIS — M79652 Pain in left thigh: Secondary | ICD-10-CM | POA: Diagnosis not present

## 2020-05-20 DIAGNOSIS — M79651 Pain in right thigh: Secondary | ICD-10-CM | POA: Diagnosis not present

## 2020-05-23 DIAGNOSIS — M25551 Pain in right hip: Secondary | ICD-10-CM | POA: Diagnosis not present

## 2020-05-23 DIAGNOSIS — M79652 Pain in left thigh: Secondary | ICD-10-CM | POA: Diagnosis not present

## 2020-05-23 DIAGNOSIS — M25552 Pain in left hip: Secondary | ICD-10-CM | POA: Diagnosis not present

## 2020-05-23 DIAGNOSIS — M79651 Pain in right thigh: Secondary | ICD-10-CM | POA: Diagnosis not present

## 2020-05-25 DIAGNOSIS — M25551 Pain in right hip: Secondary | ICD-10-CM | POA: Diagnosis not present

## 2020-05-25 DIAGNOSIS — M79651 Pain in right thigh: Secondary | ICD-10-CM | POA: Diagnosis not present

## 2020-05-25 DIAGNOSIS — M79652 Pain in left thigh: Secondary | ICD-10-CM | POA: Diagnosis not present

## 2020-05-25 DIAGNOSIS — M25552 Pain in left hip: Secondary | ICD-10-CM | POA: Diagnosis not present

## 2020-06-01 DIAGNOSIS — M79651 Pain in right thigh: Secondary | ICD-10-CM | POA: Diagnosis not present

## 2020-06-01 DIAGNOSIS — M25551 Pain in right hip: Secondary | ICD-10-CM | POA: Diagnosis not present

## 2020-06-01 DIAGNOSIS — M79652 Pain in left thigh: Secondary | ICD-10-CM | POA: Diagnosis not present

## 2020-06-01 DIAGNOSIS — M25552 Pain in left hip: Secondary | ICD-10-CM | POA: Diagnosis not present

## 2020-06-02 DIAGNOSIS — G43709 Chronic migraine without aura, not intractable, without status migrainosus: Secondary | ICD-10-CM | POA: Diagnosis not present

## 2020-06-02 DIAGNOSIS — I498 Other specified cardiac arrhythmias: Secondary | ICD-10-CM | POA: Diagnosis not present

## 2020-06-06 DIAGNOSIS — M25552 Pain in left hip: Secondary | ICD-10-CM | POA: Diagnosis not present

## 2020-06-06 DIAGNOSIS — M79651 Pain in right thigh: Secondary | ICD-10-CM | POA: Diagnosis not present

## 2020-06-06 DIAGNOSIS — M25551 Pain in right hip: Secondary | ICD-10-CM | POA: Diagnosis not present

## 2020-06-06 DIAGNOSIS — M79652 Pain in left thigh: Secondary | ICD-10-CM | POA: Diagnosis not present

## 2020-06-13 DIAGNOSIS — M79651 Pain in right thigh: Secondary | ICD-10-CM | POA: Diagnosis not present

## 2020-06-13 DIAGNOSIS — M79652 Pain in left thigh: Secondary | ICD-10-CM | POA: Diagnosis not present

## 2020-06-13 DIAGNOSIS — M25552 Pain in left hip: Secondary | ICD-10-CM | POA: Diagnosis not present

## 2020-06-13 DIAGNOSIS — M25551 Pain in right hip: Secondary | ICD-10-CM | POA: Diagnosis not present

## 2020-06-22 DIAGNOSIS — M25552 Pain in left hip: Secondary | ICD-10-CM | POA: Diagnosis not present

## 2020-06-22 DIAGNOSIS — M79651 Pain in right thigh: Secondary | ICD-10-CM | POA: Diagnosis not present

## 2020-06-22 DIAGNOSIS — M25551 Pain in right hip: Secondary | ICD-10-CM | POA: Diagnosis not present

## 2020-06-22 DIAGNOSIS — M79652 Pain in left thigh: Secondary | ICD-10-CM | POA: Diagnosis not present

## 2020-06-27 DIAGNOSIS — M5459 Other low back pain: Secondary | ICD-10-CM | POA: Diagnosis not present

## 2020-06-29 DIAGNOSIS — M25552 Pain in left hip: Secondary | ICD-10-CM | POA: Diagnosis not present

## 2020-06-29 DIAGNOSIS — M25551 Pain in right hip: Secondary | ICD-10-CM | POA: Diagnosis not present

## 2020-06-29 DIAGNOSIS — M79651 Pain in right thigh: Secondary | ICD-10-CM | POA: Diagnosis not present

## 2020-06-29 DIAGNOSIS — M79652 Pain in left thigh: Secondary | ICD-10-CM | POA: Diagnosis not present

## 2020-09-12 DIAGNOSIS — M546 Pain in thoracic spine: Secondary | ICD-10-CM | POA: Diagnosis not present

## 2020-09-14 ENCOUNTER — Emergency Department
Admission: EM | Admit: 2020-09-14 | Discharge: 2020-09-14 | Disposition: A | Discharge status: Home / Self Care | Payer: BC Managed Care – PPO | Source: Home / Self Care

## 2020-09-14 ENCOUNTER — Other Ambulatory Visit: Payer: Self-pay

## 2020-09-14 DIAGNOSIS — M79631 Pain in right forearm: Secondary | ICD-10-CM | POA: Diagnosis not present

## 2020-09-14 NOTE — Discharge Instructions (Addendum)
Forearm pain likely related to multiple IV treatment she recently had received while out of state.  On evaluation there is no concern for a blood clot.  Discontinue taking any aspirin.  Wait 48 hours before taking naproxen brand name Aleve.  You may take 1 tablet twice daily until pain resolves.  Apply warm compresses to the forearm at the affected area at least 2-3 times daily.  Continue treatment until symptoms resolve

## 2020-09-14 NOTE — ED Provider Notes (Signed)
[facility]    CSN: [id number] Arrival date & time: 09/14/20  1316      History   Chief Complaint Chief Complaint  Patient presents with  . Arm Pain    Right Forearm    HPI Tracy Cook is a 65 y.o. female.   HPI  Patient here for evaluation of localized pain of right forearm. Concerned that she developed a blood clot since flying recently from [place] after receiving several holistic IV  Treatments. Reports no pain during or after the infusions. No redness, swelling, or bruising present. Reports taking over 30 baby aspirin without relief. Denies any blood in stool since taking large number of aspirin Past Medical History:  Diagnosis Date  . Cardiomyopathy ([place])   . Diverticulosis   . Insomnia   . Menopause   . Migraine   . Orthostatic hypotension   . Osteoporosis   . Other malaise and fatigue   . Persistent disorder of initiating or maintaining sleep   . POTS (postural orthostatic tachycardia syndrome)   . Tachycardia, unspecified     Patient Active Problem List   Diagnosis Date Noted  . Attention deficit disorder (ADD) without hyperactivity 04/20/2018  . Preventative health care 11/13/2016  . Chronic migraine without aura without status migrainosus, not intractable 08/19/2015  . Osteoporosis 09/22/2014  . Compression fracture of L3 lumbar vertebra 09/22/2014  . Lumbar spondylosis 03/19/2013  . Conjunctivitis 06/30/2012  . Vaginal dryness, menopausal 06/30/2012  . Exposure to influenza 06/30/2012  . Toe pain 06/25/2012  . Colon polyps 04/17/2012  . Dermatitis 03/31/2012  . Postmenopausal atrophic vaginitis 03/31/2012  . Menopause 01/17/2012  . POTS (postural orthostatic tachycardia syndrome) 01/17/2012  . Congestive dilated cardiomyopathy ([place]) 11/07/2011  . Chest pain 11/07/2011  . UNSPECIFIED TACHYCARDIA 01/12/2009  . Migraine 12/15/2008  . INADEQUATE SLEEP HYGIENE 04/22/2008  . PERSISTENT DISORDER INITIATING/MAINTAINING SLEEP 03/11/2008  .  Other malaise and fatigue 11/27/2007  . MVP (mitral valve prolapse) 10/30/2007  . POSTURAL HYPOTENSION 10/30/2007  . HYPERMOBILITY SYNDROME 10/30/2007    Past Surgical History:  Procedure Laterality Date  . COLONOSCOPY  2013   with Dr. Collene Mares  . FINGER SURGERY Left    index-fusion  . IR KYPHO LUMBAR INC FX REDUCE BONE BX UNI/BIL CANNULATION INC/IMAGING  11/11/2019  . IR KYPHO LUMBAR INC FX REDUCE BONE BX UNI/BIL CANNULATION INC/IMAGING  12/17/2019  . IR RADIOLOGIST EVAL & MGMT  11/05/2019  . IR RADIOLOGIST EVAL & MGMT  12/09/2019  . No prior surgery    . WISDOM TOOTH EXTRACTION  1974    OB History    Gravida  2   Para  2   Term  2   Preterm      AB      Living  2     SAB      IAB      Ectopic      Multiple      Live Births               Home Medications    Prior to Admission medications   Medication Sig Start Date End Date Taking? Authorizing Provider  oxyCODONE-acetaminophen (PERCOCET/ROXICET) 5-325 MG tablet Take 1 tablet by mouth 2 (two) times daily as needed for moderate pain.  10/30/19  Yes [provider]  calcitonin, salmon, (MIACALCIN/FORTICAL) 200 UNIT/ACT nasal spray Place 1 spray into alternate nostrils daily. 02/05/20   Renato Shin, MD  ondansetron (ZOFRAN ODT) 4 MG disintegrating tablet Take 1  tablet (4 mg total) by mouth every 8 (eight) hours as needed for nausea or vomiting. 08/11/19   Blue, Olivia C, PA-C  ondansetron (ZOFRAN) 4 MG tablet Zofran 4 mg tablet  Take 1 tablet 3 times a day by oral route as needed.    [provider]  raloxifene (EVISTA) 60 MG tablet Take 1 tablet (60 mg total) by mouth daily. Patient not taking: Reported on 02/24/2020 02/05/20   Renato Shin, MD  Sodium Sulfate-Mag Sulfate-KCl (SUTAB) [phone] MG TABS Take 1 kit by mouth as directed. BIN: [id number] PCN: CN GROUP: [id number] MEMBER ID: [id number];TOI AS CASH;NO PRIOR AUTHORIZATION 02/24/20   Cirigliano, Vito V, DO  SUMAtriptan (IMITREX) 100 MG tablet SEE  NOTES Patient taking differently: Take 100 mg by mouth every 2 (two) hours as needed for migraine. SEE NOTES 01/06/18   Pieter Partridge, DO  tiZANidine (ZANAFLEX) 4 MG tablet Take 4 mg by mouth every 8 (eight) hours as needed for muscle spasms.  05/02/15   [provider]    Family History Family History  Problem Relation Age of Onset  . Coronary artery disease Father        Died of MI at age 24  . Asthma Father   . Hypertension Father   . Sudden death Father   . Heart attack Father   . Stroke Mother   . Osteoporosis Mother   . Colon polyps Mother 85  . Osteoporosis Sister   . Diabetes Neg Hx   . Hyperlipidemia Neg Hx   . Colon cancer Neg Hx   . Esophageal cancer Neg Hx   . Stomach cancer Neg Hx   . Rectal cancer Neg Hx     Social History Social History   Tobacco Use  . Smoking status: Never Smoker  . Smokeless tobacco: Never Used  Vaping Use  . Vaping Use: Never used  Substance Use Topics  . Alcohol use: No  . Drug use: No     Allergies   Alendronate sodium, Codeine, Hydrocodone-acetaminophen, Parathyroid hormone (recomb), and Vicodin [hydrocodone-acetaminophen]   Review of Systems Review of Systems Pertinent negatives listed in HPI Physical Exam Triage Vital Signs ED Triage Vitals  Enc Vitals Group     BP 09/14/20 1331 92/67     Pulse Rate 09/14/20 1331 84     Resp 09/14/20 1331 16     Temp 09/14/20 1331 97.8 F (36.6 C)     Temp Source 09/14/20 1331 Oral     SpO2 09/14/20 1331 99 %     Weight --      Height --      Head Circumference --      Peak Flow --      Pain Score 09/14/20 1329 5     Pain Loc --      Pain Edu? --      Excl. in [place]? --    No data found.  Updated Vital Signs BP 92/67 (BP Location: Left Arm) Comment: took percocet pta  Pulse 84   Temp 97.8 F (36.6 C) (Oral)   Resp 16   SpO2 99%   Visual Acuity Right Eye Distance:   Left Eye Distance:   Bilateral Distance:    Right Eye Near:   Left Eye Near:    Bilateral  Near:     Physical Exam General appearance: alert, well developed, well nourished, cooperative and in no distress Head: Normocephalic, without obvious abnormality, atraumatic Respiratory: Respirations even and unlabored, normal respiratory rate Heart:  rate and rhythm normal. No gallop or murmurs noted on exam  Abdomen: BS +, no distention, no rebound tenderness, or no mass Extremities: Right forearm 49m diameter indurated non fluctuated brachial vein-no swelling, no ecchymosis, visible abnormality seen,  Skin: Skin color, texture, turgor normal. No rashes seen  Psych: Appropriate mood and affect. UC Treatments / Results  Labs (all labs ordered are listed, but only abnormal results are displayed) Labs Reviewed - No data to display  EKG   Radiology No results found.  Procedures Procedures (including critical care time)  Medications Ordered in UC Medications - No data to display  Initial Impression / Assessment and Plan / UC Course  I have reviewed the triage vital signs and the nursing notes.  Pertinent labs & imaging results that were available during my care of the patient were reviewed by me and considered in my medical decision making (see chart for details).     Forearm palpated area of concern is consistent with a mild hardened over venous pattern of forearm which is likely a venous valve which was likely inflamed due to the number recent infusion the patient received. No redness, bruising, or visible swelling present. Patient is prescribed chronic opioids for other chronic conditions, therefore her level of pain is greatly out of proportion with exam findings. Recommended warm compresses and NSAID, however encouraged to wait 48 hours before starting NSAID given the large dose of ASA she has taken. Follow-up with PCP as needed.  Final Clinical Impressions(s) / UC Diagnoses   Final diagnoses:  Pain of right forearm     Discharge Instructions     Forearm pain likely  related to multiple IV treatment she recently had received while out of state.  On evaluation there is no concern for a blood clot.  Discontinue taking any aspirin.  Wait 48 hours before taking naproxen brand name Aleve.  You may take 1 tablet twice daily until pain resolves.  Apply warm compresses to the forearm at the affected area at least 2-3 times daily.  Continue treatment until symptoms resolve    ED Prescriptions    None     PDMP not reviewed this encounter.   [place]ot Jun [place]02/25/22 [id number]

## 2020-09-14 NOTE — ED Triage Notes (Signed)
Patient presents to Urgent Care with complaints of right forearm pain since about 2 days ago. Patient reports she recently returned from [place] and is concerned she may have a blood clot. Was treated for lymes disease in [place] and had an IV but was not placed in her forearm or wrist. No abnormalities noted on assessment.

## 2020-10-31 ENCOUNTER — Encounter: Payer: Self-pay | Admitting: Family Medicine

## 2020-11-08 ENCOUNTER — Other Ambulatory Visit: Payer: Self-pay

## 2020-11-08 ENCOUNTER — Encounter: Payer: Self-pay | Admitting: Family Medicine

## 2020-11-08 ENCOUNTER — Ambulatory Visit (INDEPENDENT_AMBULATORY_CARE_PROVIDER_SITE_OTHER): Payer: BC Managed Care – PPO | Admitting: Family Medicine

## 2020-11-08 VITALS — BP 102/60 | HR 79 | Resp 18 | Ht 69.0 in | Wt 126.0 lb

## 2020-11-08 DIAGNOSIS — R41840 Attention and concentration deficit: Secondary | ICD-10-CM | POA: Diagnosis not present

## 2020-11-08 DIAGNOSIS — F419 Anxiety disorder, unspecified: Secondary | ICD-10-CM | POA: Diagnosis not present

## 2020-11-08 MED ORDER — AMPHETAMINE-DEXTROAMPHETAMINE 10 MG PO TABS: 10.0000 mg | ORAL_TABLET | Freq: Two times a day (BID) | ORAL | 0 refills | Status: DC

## 2020-11-08 MED ORDER — ESCITALOPRAM OXALATE 10 MG PO TABS: 10.0000 mg | ORAL_TABLET | Freq: Every day | ORAL | 2 refills | Status: DC

## 2020-11-08 NOTE — Progress Notes (Signed)
Subjective:   By signing my name below, I, Tracy Cook, attest that this documentation has been prepared under the direction and in the presence of  Dr. Roma Schanz, DO. 11/08/2020    Patient ID: Tracy Cook, female    DOB: 1956/05/08, 65 y.o.   MRN: [id number]  Chief Complaint  Patient presents with  . Medication Refill    Pt says she would like to restart Adderall and an antidepressant.  -tlc,rma    HPI Patient is in today for a office visit. She reports that her anxiety has increased due to her having trouble focusing and completing daily tasks. This is causing her to having some depression. She is requesting to be prescribed Adderall to help manage her anxiety and an anti-depressant. She stopped taking Adderall after her retirement.  She has taken Lexapro and Celexa 20 years ago to manage her depression and is requesting to take them again. She stopped taking these medications because it caused her to be fatigue. Her daughter is taking Lexapro and recommended she takes it to manage her anxiety. She is not interested in seeing a counselor at this time. She denies chest pain, tightness, pressure, nausea, vomiting, SOB, headaches, dysuria, or ear pain.   Past Medical History:  Diagnosis Date  . Cardiomyopathy ([place])   . Diverticulosis   . Insomnia   . Menopause   . Migraine   . Orthostatic hypotension   . Osteoporosis   . Other malaise and fatigue   . Persistent disorder of initiating or maintaining sleep   . POTS (postural orthostatic tachycardia syndrome)   . Tachycardia, unspecified     Past Surgical History:  Procedure Laterality Date  . COLONOSCOPY  2013   with Dr. Collene Mares  . FINGER SURGERY Left    index-fusion  . IR KYPHO LUMBAR INC FX REDUCE BONE BX UNI/BIL CANNULATION INC/IMAGING  11/11/2019  . IR KYPHO LUMBAR INC FX REDUCE BONE BX UNI/BIL CANNULATION INC/IMAGING  12/17/2019  . IR RADIOLOGIST EVAL & MGMT  11/05/2019  . IR RADIOLOGIST EVAL & MGMT  12/09/2019   . No prior surgery    . WISDOM TOOTH EXTRACTION  1974    Family History  Problem Relation Age of Onset  . Coronary artery disease Father        Died of MI at age 81  . Asthma Father   . Hypertension Father   . Sudden death Father   . Heart attack Father   . Stroke Mother   . Osteoporosis Mother   . Colon polyps Mother 42  . Osteoporosis Sister   . Diabetes Neg Hx   . Hyperlipidemia Neg Hx   . Colon cancer Neg Hx   . Esophageal cancer Neg Hx   . Stomach cancer Neg Hx   . Rectal cancer Neg Hx     Social History   Socioeconomic History  . Marital status: Married    Spouse name: Not on file  . Number of children: 2  . Years of education: Not on file  . Highest education level: Not on file  Occupational History    Comment: Medial lab technologist  Tobacco Use  . Smoking status: Never Smoker  . Smokeless tobacco: Never Used  Vaping Use  . Vaping Use: Never used  Substance and Sexual Activity  . Alcohol use: No  . Drug use: No  . Sexual activity: Yes    Partners: Male  Other Topics Concern  . Not on file  Social History Narrative  Exercise almost every day   Social Determinants of Health   Financial Resource Strain: Not on file  Food Insecurity: Not on file  Transportation Needs: Not on file  Physical Activity: Not on file  Stress: Not on file  Social Connections: Not on file  Intimate Partner Violence: Not on file    Outpatient Medications Prior to Visit  Medication Sig Dispense Refill  . calcitonin, salmon, (MIACALCIN/FORTICAL) 200 UNIT/ACT nasal spray Place 1 spray into alternate nostrils daily. 3.7 mL 12  . hydrocortisone (CORTEF) 5 MG tablet hydrocortisone 5 mg tablet    . ondansetron (ZOFRAN) 4 MG tablet Zofran 4 mg tablet  Take 1 tablet 3 times a day by oral route as needed.    . SUMAtriptan (IMITREX) 100 MG tablet SEE NOTES (Patient taking differently: Take 100 mg by mouth every 2 (two) hours as needed for migraine. SEE NOTES) 12 tablet 1  .  tiZANidine (ZANAFLEX) 4 MG tablet Take 4 mg by mouth every 8 (eight) hours as needed for muscle spasms.    . ondansetron (ZOFRAN ODT) 4 MG disintegrating tablet Take 1 tablet (4 mg total) by mouth every 8 (eight) hours as needed for nausea or vomiting. (Patient not taking: Reported on 11/08/2020) 20 tablet 0  . oxyCODONE-acetaminophen (PERCOCET/ROXICET) 5-325 MG tablet Take 1 tablet by mouth 2 (two) times daily as needed for moderate pain.  (Patient not taking: Reported on 11/08/2020)    . raloxifene (EVISTA) 60 MG tablet Take 1 tablet (60 mg total) by mouth daily. (Patient not taking: Reported on 11/08/2020) 30 tablet 11  . Sodium Sulfate-Mag Sulfate-KCl (SUTAB) 418 [id number] MG TABS Take 1 kit by mouth as directed. BIN: [id number] PCN: CN GROUP: [id number] MEMBER ID: [id number];XBW AS CASH;NO PRIOR AUTHORIZATION (Patient not taking: Reported on 11/08/2020) 24 tablet 0   No facility-administered medications prior to visit.    Allergies  Allergen Reactions  . Alendronate Sodium Rash  . Codeine Nausea And Vomiting  . Hydrocodone-Acetaminophen Nausea And Vomiting    Can take with zofran   . Parathyroid Hormone (Recomb) Other (See Comments)    Bone pain and headaches Bone pain and headaches  . Vicodin [Hydrocodone-Acetaminophen] Nausea And Vomiting    Review of Systems  Constitutional: Negative for chills and fever.  HENT: Negative for congestion, ear pain, sinus pain and sore throat.   Eyes: Negative for pain.  Respiratory: Negative for cough, shortness of breath and wheezing.   Cardiovascular: Negative for chest pain, palpitations and leg swelling.  Gastrointestinal: Negative for abdominal pain, diarrhea, nausea and vomiting.  Genitourinary: Negative for dysuria.  Neurological: Negative for dizziness and headaches.  Psychiatric/Behavioral: Positive for depression. The patient is nervous/anxious.        Objective:    Physical Exam Constitutional:      General: She is not in acute  distress.    Appearance: Normal appearance. She is not ill-appearing.  HENT:     Head: Normocephalic and atraumatic.     Right Ear: External ear normal.     Left Ear: External ear normal.  Eyes:     Extraocular Movements: Extraocular movements intact.     Pupils: Pupils are equal, round, and reactive to light.  Cardiovascular:     Rate and Rhythm: Normal rate and regular rhythm.     Pulses: Normal pulses.     Heart sounds: Normal heart sounds. No murmur heard.   Pulmonary:     Effort: Pulmonary effort is normal. No respiratory distress.     Breath sounds:  Normal breath sounds. No wheezing, rhonchi or rales.  Skin:    General: Skin is warm and dry.  Neurological:     Mental Status: She is alert and oriented to person, place, and time.  Psychiatric:        Attention and Perception: She is inattentive.        Mood and Affect: Affect normal. Mood is anxious.        Speech: Speech normal.        Behavior: Behavior normal.        Thought Content: Thought content normal. Thought content does not include homicidal ideation. Thought content does not include homicidal plan.        Cognition and Memory: Cognition and memory normal. Cognition is not impaired. Memory is not impaired. She does not exhibit impaired recent memory or impaired remote memory.        Judgment: Judgment normal. Judgment is not impulsive or inappropriate.     BP 102/60 (BP Location: Right Arm, Patient Position: Sitting, Cuff Size: Normal)   Pulse 79   Resp 18   Ht '5\' 9"'  (1.753 m)   Wt 126 lb (57.2 kg)   SpO2 100%   BMI 18.61 kg/m  Wt Readings from Last 3 Encounters:  11/08/20 126 lb (57.2 kg)  03/25/20 123 lb 6.4 oz (56 kg)  02/24/20 123 lb 6.4 oz (56 kg)    Diabetic Foot Exam - Simple   No data filed    Lab Results  Component Value Date   WBC 5.6 02/05/2020   HGB 12.7 02/05/2020   HCT 38.7 02/05/2020   PLT 239.0 02/05/2020   GLUCOSE 96 10/05/2019   CHOL 149 02/05/2020   TRIG 98.0 02/05/2020    HDL 63.60 02/05/2020   LDLCALC 66 02/05/2020   ALT 24 02/07/2017   AST 22 02/07/2017   NA 143 10/05/2019   K 4.5 10/05/2019   CL 110 (H) 10/05/2019   CREATININE 0.93 10/05/2019   BUN 20 10/05/2019   CO2 19 (L) 10/05/2019   TSH 0.86 02/05/2020   INR 1.0 12/17/2019   HGBA1C 5.8 02/07/2017   MICROALBUR <0.7 11/13/2016    Lab Results  Component Value Date   TSH 0.86 02/05/2020   Lab Results  Component Value Date   WBC 5.6 02/05/2020   HGB 12.7 02/05/2020   HCT 38.7 02/05/2020   MCV 92.2 02/05/2020   PLT 239.0 02/05/2020   Lab Results  Component Value Date   NA 143 10/05/2019   K 4.5 10/05/2019   CO2 19 (L) 10/05/2019   GLUCOSE 96 10/05/2019   BUN 20 10/05/2019   CREATININE 0.93 10/05/2019   BILITOT 0.5 02/07/2017   ALKPHOS 53 02/07/2017   AST 22 02/07/2017   ALT 24 02/07/2017   PROT 7.0 02/05/2020   ALBUMIN 4.5 02/07/2017   CALCIUM 9.8 02/05/2020   GFR 72.53 02/07/2017   Lab Results  Component Value Date   CHOL 149 02/05/2020   Lab Results  Component Value Date   HDL 63.60 02/05/2020   Lab Results  Component Value Date   LDLCALC 66 02/05/2020   Lab Results  Component Value Date   TRIG 98.0 02/05/2020   Lab Results  Component Value Date   CHOLHDL 2 02/05/2020   Lab Results  Component Value Date   HGBA1C 5.8 02/07/2017       Assessment & Plan:   Problem List Items Addressed This Visit   None   Visit Diagnoses    Attention  and concentration deficit    -  Primary   Relevant Medications   amphetamine-dextroamphetamine (ADDERALL) 10 MG tablet   Anxiety       Relevant Medications   escitalopram (LEXAPRO) 10 MG tablet       Meds ordered this encounter  Medications  . amphetamine-dextroamphetamine (ADDERALL) 10 MG tablet    Sig: Take 1 tablet (10 mg total) by mouth 2 (two) times daily.    Dispense:  60 tablet    Refill:  0  . escitalopram (LEXAPRO) 10 MG tablet    Sig: Take 1 tablet (10 mg total) by mouth daily.    Dispense:  30 tablet     Refill:  2    I, [name], DO, personally preformed the services described in this documentation.  All medical record entries made by the scribe were at my direction and in my presence.  I have reviewed the chart and discharge instructions (if applicable) and agree that the record reflects my personal performance and is accurate and complete. 11/08/2020    I,Tracy Cook,acting as a scribe for [name], DO.,have documented all relevant documentation on the behalf of [name], DO,as directed by  [name], DO while in the presence of [place], DO, have reviewed all documentation for this visit. The documentation on 11/08/20 for the exam, diagnosis, procedures, and orders are all accurate and complete.    [name], DO

## 2020-11-08 NOTE — Assessment & Plan Note (Signed)
Start adderall  F/u 1 month

## 2020-11-08 NOTE — Patient Instructions (Signed)

## 2020-11-08 NOTE — Assessment & Plan Note (Signed)
Start lexapro daily F/u 1 month

## 2020-11-08 NOTE — Addendum Note (Signed)
Addended by: CREFT, Darlis Loan on: 11/08/2020 02:19 PM   Modules accepted: Orders

## 2020-11-09 LAB — DRUG MONITORING, PANEL 8 WITH CONFIRMATION, URINE
6 Acetylmorphine: NEGATIVE ng/mL (ref <10)
Alcohol Metabolites: NEGATIVE ng/mL
Amphetamines: NEGATIVE ng/mL (ref <500)
Benzodiazepines: NEGATIVE ng/mL (ref <100)
Buprenorphine, Urine: NEGATIVE ng/mL (ref <5)
Cocaine Metabolite: NEGATIVE ng/mL (ref <150)
Creatinine: 39 mg/dL
MDMA: NEGATIVE ng/mL (ref <500)
Marijuana Metabolite: NEGATIVE ng/mL (ref <20)
Opiates: NEGATIVE ng/mL (ref <100)
Oxidant: NEGATIVE ug/mL
Oxycodone: NEGATIVE ng/mL (ref <100)
pH: 5.9 (ref 4.5–9.0)

## 2020-11-09 LAB — DM TEMPLATE

## 2020-11-14 NOTE — Progress Notes (Signed)
HPI: FU CM and POTS. Echocardiogram March 2021 showed normal LV function, grade 1 diastolic dysfunction. Cardiac CTA March 2021 showed calcium score 11 and moderate (50 to 69%) mixed plaque stenosis in the proximal LAD. FFR in the LAD 0.83 not suggestive of significant stenosis. Since last seen there is no dyspnea, exertional chest pain or syncope.  Some dizziness with standing at times.  Current Outpatient Medications  Medication Sig Dispense Refill  . amphetamine-dextroamphetamine (ADDERALL) 10 MG tablet Take 1 tablet (10 mg total) by mouth 2 (two) times daily. 60 tablet 0  . calcitonin, salmon, (MIACALCIN/FORTICAL) 200 UNIT/ACT nasal spray Place 1 spray into alternate nostrils daily. 3.7 mL 12  . escitalopram (LEXAPRO) 10 MG tablet Take 1 tablet (10 mg total) by mouth daily. 30 tablet 2  . ondansetron (ZOFRAN) 4 MG tablet Zofran 4 mg tablet  Take 1 tablet 3 times a day by oral route as needed.    . SUMAtriptan (IMITREX) 100 MG tablet SEE NOTES 12 tablet 1  . tiZANidine (ZANAFLEX) 4 MG tablet Take 4 mg by mouth every 8 (eight) hours as needed for muscle spasms.     No current facility-administered medications for this visit.     Past Medical History:  Diagnosis Date  . Cardiomyopathy ([place])   . Diverticulosis   . Insomnia   . Menopause   . Migraine   . Orthostatic hypotension   . Osteoporosis   . Other malaise and fatigue   . Persistent disorder of initiating or maintaining sleep   . POTS (postural orthostatic tachycardia syndrome)   . Tachycardia, unspecified     Past Surgical History:  Procedure Laterality Date  . COLONOSCOPY  2013   with Dr. Collene Mares  . FINGER SURGERY Left    index-fusion  . IR KYPHO LUMBAR INC FX REDUCE BONE BX UNI/BIL CANNULATION INC/IMAGING  11/11/2019  . IR KYPHO LUMBAR INC FX REDUCE BONE BX UNI/BIL CANNULATION INC/IMAGING  12/17/2019  . IR RADIOLOGIST EVAL & MGMT  11/05/2019  . IR RADIOLOGIST EVAL & MGMT  12/09/2019  . No prior surgery    . WISDOM  TOOTH EXTRACTION  1974    Social History   Socioeconomic History  . Marital status: Married    Spouse name: Not on file  . Number of children: 2  . Years of education: Not on file  . Highest education level: Not on file  Occupational History    Comment: Medial lab technologist  Tobacco Use  . Smoking status: Never Smoker  . Smokeless tobacco: Never Used  Vaping Use  . Vaping Use: Never used  Substance and Sexual Activity  . Alcohol use: No  . Drug use: No  . Sexual activity: Yes    Partners: Male  Other Topics Concern  . Not on file  Social History Narrative   Exercise almost every day   Social Determinants of Health   Financial Resource Strain: Not on file  Food Insecurity: Not on file  Transportation Needs: Not on file  Physical Activity: Not on file  Stress: Not on file  Social Connections: Not on file  Intimate Partner Violence: Not on file    Family History  Problem Relation Age of Onset  . Coronary artery disease Father        Died of MI at age 84  . Asthma Father   . Hypertension Father   . Sudden death Father   . Heart attack Father   . Stroke Mother   .  Osteoporosis Mother   . Colon polyps Mother 50  . Osteoporosis Sister   . Diabetes Neg Hx   . Hyperlipidemia Neg Hx   . Colon cancer Neg Hx   . Esophageal cancer Neg Hx   . Stomach cancer Neg Hx   . Rectal cancer Neg Hx     ROS: no fevers or chills, productive cough, hemoptysis, dysphasia, odynophagia, melena, hematochezia, dysuria, hematuria, rash, seizure activity, orthopnea, PND, pedal edema, claudication. Remaining systems are negative.  Physical Exam: Well-developed well-nourished in no acute distress.  Skin is warm and dry.  HEENT is normal.  Neck is supple.  Chest is clear to auscultation with normal expansion.  Cardiovascular exam is regular rate and rhythm.  Abdominal exam nontender or distended. No masses palpated. Extremities show no edema. neuro grossly intact  ECG-normal  sinus rhythm at a rate of 70, no ST changes.  Personally reviewed  A/P  1 history of cardiomyopathy-LV function has improved on most recent echocardiogram.  We will continue beta-blocker.  2 history of chest pain-cardiac CTA showed moderate disease in the LAD but FFR negative.  No change in symptoms. Continue medical therapy.  3 coronary artery disease-moderate disease in the proximal LAD.  Resume aspirin 81 mg daily.  We discussed statin therapy but she declined.  She will consider and contact us if she is agreeable.  4 history of POTS-continue beta-blocker.  Symptoms are controlled.  Kirk Ruths, MD

## 2020-11-23 ENCOUNTER — Other Ambulatory Visit: Payer: Self-pay

## 2020-11-23 ENCOUNTER — Ambulatory Visit: Payer: BC Managed Care – PPO | Admitting: Cardiology

## 2020-11-23 ENCOUNTER — Encounter: Payer: Self-pay | Admitting: Cardiology

## 2020-11-23 VITALS — BP 108/78 | HR 70 | Ht 69.0 in | Wt 127.1 lb

## 2020-11-23 DIAGNOSIS — I2510 Atherosclerotic heart disease of native coronary artery without angina pectoris: Secondary | ICD-10-CM | POA: Diagnosis not present

## 2020-11-23 DIAGNOSIS — R0602 Shortness of breath: Secondary | ICD-10-CM

## 2020-11-23 DIAGNOSIS — I498 Other specified cardiac arrhythmias: Secondary | ICD-10-CM

## 2020-11-23 DIAGNOSIS — G90A Postural orthostatic tachycardia syndrome (POTS): Secondary | ICD-10-CM

## 2020-11-23 MED ORDER — PROPRANOLOL HCL 20 MG PO TABS: 20.0000 mg | ORAL_TABLET | Freq: Three times a day (TID) | ORAL | 3 refills | Status: DC

## 2020-11-23 MED ORDER — ASPIRIN EC 81 MG PO TBEC: 81.0000 mg | DELAYED_RELEASE_TABLET | Freq: Every day | ORAL | 3 refills | Status: DC

## 2020-11-23 NOTE — Patient Instructions (Signed)
Medication Instructions:   START ASPIRIN 81 MG ONCE DAILY  *If you need a refill on your cardiac medications before your next appointment, please call your pharmacy*   Follow-Up: At [facility], you and your health needs are our priority.  As part of our continuing mission to provide you with exceptional heart care, we have created designated Provider Care Teams.  These Care Teams include your primary Cardiologist (physician) and Advanced Practice Providers (APPs -  Physician Assistants and Nurse Practitioners) who all work together to provide you with the care you need, when you need it.  We recommend signing up for the patient portal called "MyChart".  Sign up information is provided on this After Visit Summary.  MyChart is used to connect with patients for Virtual Visits (Telemedicine).  Patients are able to view lab/test results, encounter notes, upcoming appointments, etc.  Non-urgent messages can be sent to your provider as well.   To learn more about what you can do with MyChart, go to [email or web address].    Your next appointment:   12 month(s)  The format for your next appointment:   In Person  Provider:   Brian Crenshaw, MD   

## 2020-12-06 ENCOUNTER — Encounter: Payer: Self-pay | Admitting: Family Medicine

## 2020-12-06 ENCOUNTER — Other Ambulatory Visit: Payer: Self-pay

## 2020-12-06 ENCOUNTER — Ambulatory Visit (INDEPENDENT_AMBULATORY_CARE_PROVIDER_SITE_OTHER): Payer: BC Managed Care – PPO | Admitting: Family Medicine

## 2020-12-06 VITALS — BP 98/70 | HR 58 | Temp 98.7°F | Resp 18 | Ht 69.0 in | Wt 125.8 lb

## 2020-12-06 DIAGNOSIS — R41840 Attention and concentration deficit: Secondary | ICD-10-CM

## 2020-12-06 DIAGNOSIS — F988 Other specified behavioral and emotional disorders with onset usually occurring in childhood and adolescence: Secondary | ICD-10-CM | POA: Diagnosis not present

## 2020-12-06 DIAGNOSIS — F419 Anxiety disorder, unspecified: Secondary | ICD-10-CM

## 2020-12-06 MED ORDER — AMPHETAMINE-DEXTROAMPHETAMINE 20 MG PO TABS: 20.0000 mg | ORAL_TABLET | Freq: Two times a day (BID) | ORAL | 0 refills | Status: DC

## 2020-12-06 MED ORDER — ESCITALOPRAM OXALATE 10 MG PO TABS
10.0000 mg | ORAL_TABLET | Freq: Every day | ORAL | 3 refills | Status: DC
Start: 2020-12-06 — End: 2021-11-30

## 2020-12-06 NOTE — Progress Notes (Signed)
Subjective:   By signing my name below, I, Tracy Cook, attest that this documentation has been prepared under the direction and in the presence of Dr. Roma Schanz, DO. 12/06/2020    Patient ID: Tracy Cook, female    DOB: 07-26-1955, 65 y.o.   MRN: [id number]  Chief Complaint  Patient presents with  . ADD  . Anxiety  . Follow-up    HPI Patient is in today for a office visit. She is doing better at this time. She is requesting to increase the doasage of her 10 mg Adderall 2x daily PO because the effects are not lasting long enough to manage her ADD symptoms. She is managing her anxiety symptoms well on 10 mg Lexapro daily PO and is also requesting a refill. She denies having any fever, ear pain, congestion, sinus pain, sore throat, eye pain, chest pain, palpations, cough, shortness of breath, wheezing, n/v/d, constipation, blood in stool, dysuria, frequency, hematuria, dizziness, or headaches at this time.    Past Medical History:  Diagnosis Date  . Cardiomyopathy ([place])   . Diverticulosis   . Insomnia   . Menopause   . Migraine   . Orthostatic hypotension   . Osteoporosis   . Other malaise and fatigue   . Persistent disorder of initiating or maintaining sleep   . POTS (postural orthostatic tachycardia syndrome)   . Tachycardia, unspecified     Past Surgical History:  Procedure Laterality Date  . COLONOSCOPY  2013   with Dr. Collene Mares  . FINGER SURGERY Left    index-fusion  . IR KYPHO LUMBAR INC FX REDUCE BONE BX UNI/BIL CANNULATION INC/IMAGING  11/11/2019  . IR KYPHO LUMBAR INC FX REDUCE BONE BX UNI/BIL CANNULATION INC/IMAGING  12/17/2019  . IR RADIOLOGIST EVAL & MGMT  11/05/2019  . IR RADIOLOGIST EVAL & MGMT  12/09/2019  . No prior surgery    . WISDOM TOOTH EXTRACTION  1974    Family History  Problem Relation Age of Onset  . Coronary artery disease Father        Died of MI at age 77  . Asthma Father   . Hypertension Father   . Sudden death Father   .  Heart attack Father   . Stroke Mother   . Osteoporosis Mother   . Colon polyps Mother 28  . Osteoporosis Sister   . Diabetes Neg Hx   . Hyperlipidemia Neg Hx   . Colon cancer Neg Hx   . Esophageal cancer Neg Hx   . Stomach cancer Neg Hx   . Rectal cancer Neg Hx     Social History   Socioeconomic History  . Marital status: Married    Spouse name: Not on file  . Number of children: 2  . Years of education: Not on file  . Highest education level: Not on file  Occupational History    Comment: Medial lab technologist  Tobacco Use  . Smoking status: Never Smoker  . Smokeless tobacco: Never Used  Vaping Use  . Vaping Use: Never used  Substance and Sexual Activity  . Alcohol use: No  . Drug use: No  . Sexual activity: Yes    Partners: Male  Other Topics Concern  . Not on file  Social History Narrative   Exercise almost every day   Social Determinants of Health   Financial Resource Strain: Not on file  Food Insecurity: Not on file  Transportation Needs: Not on file  Physical Activity: Not on file  Stress:  Not on file  Social Connections: Not on file  Intimate Partner Violence: Not on file    Outpatient Medications Prior to Visit  Medication Sig Dispense Refill  . aspirin EC 81 MG tablet Take 1 tablet (81 mg total) by mouth daily. Swallow whole. 90 tablet 3  . calcitonin, salmon, (MIACALCIN/FORTICAL) 200 UNIT/ACT nasal spray Place 1 spray into alternate nostrils daily. 3.7 mL 12  . ondansetron (ZOFRAN) 4 MG tablet Zofran 4 mg tablet  Take 1 tablet 3 times a day by oral route as needed.    . propranolol (INDERAL) 20 MG tablet Take 1 tablet (20 mg total) by mouth 3 (three) times daily. 270 tablet 3  . SUMAtriptan (IMITREX) 100 MG tablet SEE NOTES 12 tablet 1  . tiZANidine (ZANAFLEX) 4 MG tablet Take 4 mg by mouth every 8 (eight) hours as needed for muscle spasms.    Marland Kitchen amphetamine-dextroamphetamine (ADDERALL) 10 MG tablet Take 1 tablet (10 mg total) by mouth 2 (two) times  daily. 60 tablet 0  . escitalopram (LEXAPRO) 10 MG tablet Take 1 tablet (10 mg total) by mouth daily. 30 tablet 2   No facility-administered medications prior to visit.    Allergies  Allergen Reactions  . Alendronate Sodium Rash  . Codeine Nausea And Vomiting  . Hydrocodone-Acetaminophen Nausea And Vomiting    Can take with zofran   . Parathyroid Hormone (Recomb) Other (See Comments)    Bone pain and headaches Bone pain and headaches  . Vicodin [Hydrocodone-Acetaminophen] Nausea And Vomiting    Review of Systems  Constitutional: Negative for fever.  HENT: Negative for congestion, ear pain, sinus pain and sore throat.   Eyes: Negative for pain.  Respiratory: Negative for cough, shortness of breath and wheezing.   Cardiovascular: Negative for chest pain and palpitations.  Gastrointestinal: Negative for blood in stool, constipation, diarrhea, nausea and vomiting.  Genitourinary: Negative for dysuria, frequency and hematuria.  Neurological: Negative for dizziness and headaches.       Objective:    Physical Exam Constitutional:      Appearance: Normal appearance.  HENT:     Head: Normocephalic and atraumatic.     Right Ear: External ear normal.     Left Ear: External ear normal.  Eyes:     Extraocular Movements: Extraocular movements intact.     Pupils: Pupils are equal, round, and reactive to light.  Cardiovascular:     Rate and Rhythm: Normal rate and regular rhythm.     Pulses: Normal pulses.     Heart sounds: Normal heart sounds. No murmur heard. No friction rub. No gallop.   Pulmonary:     Effort: Pulmonary effort is normal. No respiratory distress.     Breath sounds: Normal breath sounds. No stridor. No wheezing, rhonchi or rales.  Skin:    General: Skin is warm and dry.  Neurological:     Mental Status: She is alert and oriented to person, place, and time.  Psychiatric:        Behavior: Behavior normal.     BP 98/70 (BP Location: Left Arm, Patient  Position: Sitting, Cuff Size: Normal)   Pulse (!) 58   Temp 98.7 F (37.1 C) (Oral)   Resp 18   Ht 5\' 9"  (1.753 m)   Wt 125 lb 12.8 oz (57.1 kg)   SpO2 99%   BMI 18.58 kg/m  Wt Readings from Last 3 Encounters:  12/06/20 125 lb 12.8 oz (57.1 kg)  11/23/20 127 lb 1.3 oz (57.6 kg)  11/08/20 126 lb (57.2 kg)    Diabetic Foot Exam - Simple   No data filed    Lab Results  Component Value Date   WBC 5.6 02/05/2020   HGB 12.7 02/05/2020   HCT 38.7 02/05/2020   PLT 239.0 02/05/2020   GLUCOSE 96 10/05/2019   CHOL 149 02/05/2020   TRIG 98.0 02/05/2020   HDL 63.60 02/05/2020   LDLCALC 66 02/05/2020   ALT 24 02/07/2017   AST 22 02/07/2017   NA 143 10/05/2019   K 4.5 10/05/2019   CL 110 (H) 10/05/2019   CREATININE 0.93 10/05/2019   BUN 20 10/05/2019   CO2 19 (L) 10/05/2019   TSH 0.86 02/05/2020   INR 1.0 12/17/2019   HGBA1C 5.8 02/07/2017   MICROALBUR <0.7 11/13/2016    Lab Results  Component Value Date   TSH 0.86 02/05/2020   Lab Results  Component Value Date   WBC 5.6 02/05/2020   HGB 12.7 02/05/2020   HCT 38.7 02/05/2020   MCV 92.2 02/05/2020   PLT 239.0 02/05/2020   Lab Results  Component Value Date   NA 143 10/05/2019   K 4.5 10/05/2019   CO2 19 (L) 10/05/2019   GLUCOSE 96 10/05/2019   BUN 20 10/05/2019   CREATININE 0.93 10/05/2019   BILITOT 0.5 02/07/2017   ALKPHOS 53 02/07/2017   AST 22 02/07/2017   ALT 24 02/07/2017   PROT 7.0 02/05/2020   ALBUMIN 4.5 02/07/2017   CALCIUM 9.8 02/05/2020   GFR 72.53 02/07/2017   Lab Results  Component Value Date   CHOL 149 02/05/2020   Lab Results  Component Value Date   HDL 63.60 02/05/2020   Lab Results  Component Value Date   LDLCALC 66 02/05/2020   Lab Results  Component Value Date   TRIG 98.0 02/05/2020   Lab Results  Component Value Date   CHOLHDL 2 02/05/2020   Lab Results  Component Value Date   HGBA1C 5.8 02/07/2017       Assessment & Plan:   Problem List Items Addressed This  Visit      Unprioritized   Anxiety    lexapro --- working well F/u 1 month      Relevant Medications   escitalopram (LEXAPRO) 10 MG tablet   Attention and concentration deficit    Inc adderall 20 mg bid and f/u 1 month or sooner prn Will con't to watch HR Low today      Attention deficit disorder (ADD) without hyperactivity - Primary   Relevant Medications   amphetamine-dextroamphetamine (ADDERALL) 20 MG tablet       Meds ordered this encounter  Medications  . amphetamine-dextroamphetamine (ADDERALL) 20 MG tablet    Sig: Take 1 tablet (20 mg total) by mouth 2 (two) times daily.    Dispense:  60 tablet    Refill:  0  . escitalopram (LEXAPRO) 10 MG tablet    Sig: Take 1 tablet (10 mg total) by mouth daily.    Dispense:  90 tablet    Refill:  3    I, Dr. Roma Schanz, DO, personally preformed the services described in this documentation.  All medical record entries made by the scribe were at my direction and in my presence.  I have reviewed the chart and discharge instructions (if applicable) and agree that the record reflects my personal performance and is accurate and complete. 12/06/2020   I,Tracy Cook,acting as a scribe for [name], DO.,have documented all relevant documentation on the  behalf of [name], DO,as directed by  [name], DO while in the presence of [name], DO.   [name], DO

## 2020-12-06 NOTE — Patient Instructions (Signed)

## 2020-12-06 NOTE — Assessment & Plan Note (Signed)
lexapro --- working well F/u 1 month

## 2020-12-06 NOTE — Assessment & Plan Note (Signed)
Inc adderall 20 mg bid and f/u 1 month or sooner prn Will con't to watch HR Low today

## 2021-01-03 ENCOUNTER — Other Ambulatory Visit: Payer: Self-pay

## 2021-01-03 ENCOUNTER — Encounter: Payer: Self-pay | Admitting: Family Medicine

## 2021-01-03 ENCOUNTER — Ambulatory Visit (INDEPENDENT_AMBULATORY_CARE_PROVIDER_SITE_OTHER): Payer: BC Managed Care – PPO | Admitting: Family Medicine

## 2021-01-03 DIAGNOSIS — F988 Other specified behavioral and emotional disorders with onset usually occurring in childhood and adolescence: Secondary | ICD-10-CM

## 2021-01-03 MED ORDER — AMPHETAMINE-DEXTROAMPHETAMINE 20 MG PO TABS: 20.0000 mg | ORAL_TABLET | Freq: Two times a day (BID) | ORAL | 0 refills | Status: DC

## 2021-01-03 NOTE — Progress Notes (Signed)
Subjective:   By signing my name below, I, [name], attest that this documentation has been prepared under the direction and in the presence of Dr. Roma Schanz, DO. 01/03/2021    Patient ID: Tracy Cook, female    DOB: 23-Mar-1956, 65 y.o.   MRN: [id number]  No chief complaint on file.   HPI Patient is in today for a office visit. She is doing well while on 20 mg adderall 2x daily PO. She is requesting a refill on 20 mg adderall 2x daily PO, 10 mg lexapro daily PO, 20 mg inderal 3x daily PO. She denies having any cain, nausea, weight loss since taking 20 mg adderall 2x daily PO. She is the [employer] and [employer] vaccine and has the [employer] booster vaccines. She is planning to schedule a mammogram appointment. Her pap smear is due 2024.  Past Medical History:  Diagnosis Date   Cardiomyopathy ([place])    Diverticulosis    Insomnia    Menopause    Migraine    Orthostatic hypotension    Osteoporosis    Other malaise and fatigue    Persistent disorder of initiating or maintaining sleep    POTS (postural orthostatic tachycardia syndrome)    Tachycardia, unspecified     Past Surgical History:  Procedure Laterality Date   COLONOSCOPY  2013   with Dr. Collene Mares   FINGER SURGERY Left    index-fusion   IR KYPHO LUMBAR INC FX REDUCE BONE BX UNI/BIL CANNULATION INC/IMAGING  11/11/2019   IR KYPHO LUMBAR INC FX REDUCE BONE BX UNI/BIL CANNULATION INC/IMAGING  12/17/2019   IR RADIOLOGIST EVAL & MGMT  11/05/2019   IR RADIOLOGIST EVAL & MGMT  12/09/2019   No prior surgery     WISDOM TOOTH EXTRACTION  1974    Family History  Problem Relation Age of Onset   Coronary artery disease Father        Died of MI at age 30   Asthma Father    Hypertension Father    Sudden death Father    Heart attack Father    Stroke Mother    Osteoporosis Mother    Colon polyps Mother 49   Osteoporosis Sister    Diabetes Neg Hx    Hyperlipidemia Neg Hx    Colon cancer Neg Hx     Esophageal cancer Neg Hx    Stomach cancer Neg Hx    Rectal cancer Neg Hx     Social History   Socioeconomic History   Marital status: Married    Spouse name: Not on file   Number of children: 2   Years of education: Not on file   Highest education level: Not on file  Occupational History    Comment: Medial lab technologist  Tobacco Use   Smoking status: Never   Smokeless tobacco: Never  Vaping Use   Vaping Use: Never used  Substance and Sexual Activity   Alcohol use: No   Drug use: No   Sexual activity: Yes    Partners: Male  Other Topics Concern   Not on file  Social History Narrative   Exercise almost every day   Social Determinants of Health   Financial Resource Strain: Not on file  Food Insecurity: Not on file  Transportation Needs: Not on file  Physical Activity: Not on file  Stress: Not on file  Social Connections: Not on file  Intimate Partner Violence: Not on file    Outpatient Medications Prior to Visit  Medication Sig Dispense Refill   amphetamine-dextroamphetamine (ADDERALL) 20 MG tablet Take 1 tablet (20 mg total) by mouth 2 (two) times daily. 60 tablet 0   aspirin EC 81 MG tablet Take 1 tablet (81 mg total) by mouth daily. Swallow whole. 90 tablet 3   calcitonin, salmon, (MIACALCIN/FORTICAL) 200 UNIT/ACT nasal spray Place 1 spray into alternate nostrils daily. 3.7 mL 12   escitalopram (LEXAPRO) 10 MG tablet Take 1 tablet (10 mg total) by mouth daily. 90 tablet 3   ondansetron (ZOFRAN) 4 MG tablet Zofran 4 mg tablet  Take 1 tablet 3 times a day by oral route as needed.     propranolol (INDERAL) 20 MG tablet Take 1 tablet (20 mg total) by mouth 3 (three) times daily. 270 tablet 3   SUMAtriptan (IMITREX) 100 MG tablet SEE NOTES 12 tablet 1   tiZANidine (ZANAFLEX) 4 MG tablet Take 4 mg by mouth every 8 (eight) hours as needed for muscle spasms.     No facility-administered medications prior to visit.    Allergies  Allergen Reactions   Alendronate  Sodium Rash   Codeine Nausea And Vomiting   Hydrocodone-Acetaminophen Nausea And Vomiting    Can take with zofran    Parathyroid Hormone (Recomb) Other (See Comments)    Bone pain and headaches Bone pain and headaches   Vicodin [Hydrocodone-Acetaminophen] Nausea And Vomiting    Review of Systems  Constitutional:  Negative for weight loss.  Cardiovascular:  Negative for chest pain.  Gastrointestinal:  Negative for nausea.      Objective:    Physical Exam Constitutional:      General: She is not in acute distress.    Appearance: Normal appearance. She is not ill-appearing.  HENT:     Head: Normocephalic and atraumatic.     Right Ear: External ear normal.     Left Ear: External ear normal.  Eyes:     Extraocular Movements: Extraocular movements intact.     Pupils: Pupils are equal, round, and reactive to light.  Cardiovascular:     Rate and Rhythm: Normal rate and regular rhythm.     Pulses: Normal pulses.     Heart sounds: Normal heart sounds. No murmur heard.   No gallop.  Pulmonary:     Effort: Pulmonary effort is normal. No respiratory distress.     Breath sounds: Normal breath sounds. No wheezing, rhonchi or rales.  Skin:    General: Skin is warm and dry.  Neurological:     Mental Status: She is alert and oriented to person, place, and time.  Psychiatric:        Behavior: Behavior normal.    There were no vitals taken for this visit. Wt Readings from Last 3 Encounters:  12/06/20 125 lb 12.8 oz (57.1 kg)  11/23/20 127 lb 1.3 oz (57.6 kg)  11/08/20 126 lb (57.2 kg)    Diabetic Foot Exam - Simple   No data filed    Lab Results  Component Value Date   WBC 5.6 02/05/2020   HGB 12.7 02/05/2020   HCT 38.7 02/05/2020   PLT 239.0 02/05/2020   GLUCOSE 96 10/05/2019   CHOL 149 02/05/2020   TRIG 98.0 02/05/2020   HDL 63.60 02/05/2020   LDLCALC 66 02/05/2020   ALT 24 02/07/2017   AST 22 02/07/2017   NA 143 10/05/2019   K 4.5 10/05/2019   CL 110 (H)  10/05/2019   CREATININE 0.93 10/05/2019   BUN 20 10/05/2019   CO2 19 (  L) 10/05/2019   TSH 0.86 02/05/2020   INR 1.0 12/17/2019   HGBA1C 5.8 02/07/2017   MICROALBUR <0.7 11/13/2016    Lab Results  Component Value Date   TSH 0.86 02/05/2020   Lab Results  Component Value Date   WBC 5.6 02/05/2020   HGB 12.7 02/05/2020   HCT 38.7 02/05/2020   MCV 92.2 02/05/2020   PLT 239.0 02/05/2020   Lab Results  Component Value Date   NA 143 10/05/2019   K 4.5 10/05/2019   CO2 19 (L) 10/05/2019   GLUCOSE 96 10/05/2019   BUN 20 10/05/2019   CREATININE 0.93 10/05/2019   BILITOT 0.5 02/07/2017   ALKPHOS 53 02/07/2017   AST 22 02/07/2017   ALT 24 02/07/2017   PROT 7.0 02/05/2020   ALBUMIN 4.5 02/07/2017   CALCIUM 9.8 02/05/2020   GFR 72.53 02/07/2017   Lab Results  Component Value Date   CHOL 149 02/05/2020   Lab Results  Component Value Date   HDL 63.60 02/05/2020   Lab Results  Component Value Date   LDLCALC 66 02/05/2020   Lab Results  Component Value Date   TRIG 98.0 02/05/2020   Lab Results  Component Value Date   CHOLHDL 2 02/05/2020   Lab Results  Component Value Date   HGBA1C 5.8 02/07/2017       Assessment & Plan:   Problem List Items Addressed This Visit   None    No orders of the defined types were placed in this encounter.   I, Dr. Roma Schanz, DO, personally preformed the services described in this documentation.  All medical record entries made by the scribe were at my direction and in my presence.  I have reviewed the chart and discharge instructions (if applicable) and agree that the record reflects my personal performance and is accurate and complete. 01/03/2021   I,[name],acting as a Education administrator for [employer], DO.,have documented all relevant documentation on the behalf of [name], DO,as directed by  [name], DO while in the presence of [name], DO.   Shehryar [employer]

## 2021-01-03 NOTE — Patient Instructions (Signed)
Living With Attention Deficit Hyperactivity Disorder If you have been diagnosed with attention deficit hyperactivity disorder (ADHD), you may be relieved that you now know why you have felt or behaved a certain way. Still, you may feel overwhelmed about the treatment ahead. You may also wonder how to get the support you need and how to deal with the condition day-to-day. With treatment and support, you can live with ADHD and manage your symptoms. How to manage lifestyle changes Managing stress Stress is your body's reaction to life changes and events, both good and bad. To cope with the stress of an ADHD diagnosis, it may help to: Learn more about ADHD. Exercise regularly. Even a short daily walk can lower stress levels. Participate in training or education programs (including social skills training classes) that teach you to deal with symptoms.  Medicines Your health care provider may suggest certain medicines if he or she feels that they will help to improve your condition. Stimulant medicines are usually prescribed to treat ADHD, and therapy may also be prescribed. It is important to: Avoid using alcohol and other substances that may prevent your medicines from working properly (may interact). Talk with your pharmacist or health care provider about all the medicines that you take, their possible side effects, and what medicines are safe to take together. Make it your goal to take part in all treatment decisions (shared decision-making). Ask about possible side effects of medicines that your health care provider recommends, and tell him or her how you feel about having those side effects. It is best if shared decision-making with your health care provider is part of your total treatment plan. Relationships To strengthen your relationships with family members while treating your condition, consider taking part in family therapy. You might also attend self-help groups alone or with a loved one. Be  honest about how your symptoms affect your relationships. Make an effort to communicate respectfully instead of fighting, and find ways to show others that you care. Psychotherapy may be useful in helping you cope with how ADHD affects your relationships. How to recognize changes in your condition The following signs may mean that your treatment is working well and your condition is improving: Consistently being on time for appointments. Being more organized at home and work. Other people noticing improvements in your behavior. Achieving goals that you set for yourself. Thinking more clearly. The following signs may mean that your treatment is not working very well: Feeling impatience or more confusion. Missing, forgetting, or being late for appointments. An increasing sense of disorganization and messiness. More difficulty in reaching goals that you set for yourself. Loved ones becoming angry or frustrated with you. Follow these instructions at home: Take over-the-counter and prescription medicines only as told by your health care provider. Check with your health care provider before taking any new medicines. Create structure and an organized atmosphere at home. For example: Make a list of tasks, then rank them from most important to least important. Work on one task at a time until your listed tasks are done. Make a daily schedule and follow it consistently every day. Use an appointment calendar, and check it 2 or 3 times a day to keep on track. Keep it with you when you leave the house. Create spaces where you keep certain things, and always put things back in their places after you use them. Keep all follow-up visits as told by your health care provider. This is important. Where to find support Talking to others    Keep emotion out of important discussions and speak in a calm, logical way. Listen closely and patiently to your loved ones. Try to understand their point of view, and try to  avoid getting defensive. Take responsibility for the consequences of your actions. Ask that others do not take your behaviors personally. Aim to solve problems as they come up, and express your feelings instead of bottling them up. Talk openly about what you need from your loved ones and how they can support you. Consider going to family therapy sessions or having your family meet with a specialist who deals with ADHD-related behavior problems. Finances Not all insurance plans cover mental health care, so it is important to check with your insurance carrier. If paying for co-pays or counseling services is a problem, search for a local or county mental health care center. Public mental health care services may be offered there at a low cost or no cost when you are not able to see a private health care provider. If you are taking medicine for ADHD, you may be able to get the generic form, which may be less expensive than brand-name medicine. Some makers of prescription medicines also offer help to patients who cannot afford the medicines that they need. Questions to ask your health care provider: What are the risks and benefits of taking medicines? Would I benefit from therapy? How often should I follow up with a health care provider? Contact a health care provider if: You have side effects from your medicines, such as: Repeated muscle twitches, coughing, or speech outbursts. Sleep problems. Loss of appetite. Depression. New or worsening behavior problems. Dizziness. Unusually fast heartbeat. Stomach pains. Headaches. Get help right away if: You have a severe reaction to a medicine. Your behavior suddenly gets worse. Summary With treatment and support, you can live with ADHD and manage your symptoms. The medicines that are most often prescribed for ADHD are stimulants. Consider taking part in family therapy or self-help groups with family members or friends. When you talk with friends  and family about your ADHD, be patient and communicate openly. Take over-the-counter and prescription medicines only as told by your health care provider. Check with your health care provider before taking any new medicines. This information is not intended to replace advice given to you by your health care provider. Make sure you discuss any questions you have with your health care provider. Document Revised: 12/23/2019 Document Reviewed: 12/23/2019 Elsevier Patient Education  2022 Else[employer] Inc.  

## 2021-02-03 ENCOUNTER — Ambulatory Visit: Payer: BC Managed Care – PPO | Admitting: Endocrinology

## 2021-02-03 DIAGNOSIS — M810 Age-related osteoporosis without current pathological fracture: Secondary | ICD-10-CM | POA: Diagnosis not present

## 2021-02-03 DIAGNOSIS — M5136 Other intervertebral disc degeneration, lumbar region: Secondary | ICD-10-CM | POA: Diagnosis not present

## 2021-02-17 DIAGNOSIS — M4856XA Collapsed vertebra, not elsewhere classified, lumbar region, initial encounter for fracture: Secondary | ICD-10-CM | POA: Diagnosis not present

## 2021-02-17 DIAGNOSIS — M5136 Other intervertebral disc degeneration, lumbar region: Secondary | ICD-10-CM | POA: Diagnosis not present

## 2021-02-20 ENCOUNTER — Other Ambulatory Visit: Payer: Self-pay | Admitting: Chiropractic Medicine

## 2021-02-20 ENCOUNTER — Other Ambulatory Visit (HOSPITAL_COMMUNITY): Payer: Self-pay | Admitting: Chiropractic Medicine

## 2021-02-20 DIAGNOSIS — M4856XA Collapsed vertebra, not elsewhere classified, lumbar region, initial encounter for fracture: Secondary | ICD-10-CM

## 2021-03-01 ENCOUNTER — Other Ambulatory Visit: Payer: Self-pay

## 2021-03-01 ENCOUNTER — Ambulatory Visit (HOSPITAL_COMMUNITY)
Admission: RE | Admit: 2021-03-01 | Discharge: 2021-03-01 | Disposition: A | Discharge status: Home / Self Care | Payer: BC Managed Care – PPO | Source: Ambulatory Visit | Attending: Chiropractic Medicine | Admitting: Chiropractic Medicine

## 2021-03-01 ENCOUNTER — Other Ambulatory Visit (HOSPITAL_COMMUNITY): Payer: BC Managed Care – PPO

## 2021-03-01 ENCOUNTER — Encounter (HOSPITAL_COMMUNITY)
Admission: RE | Admit: 2021-03-01 | Discharge: 2021-03-01 | Disposition: A | Discharge status: Home / Self Care | Payer: BC Managed Care – PPO | Source: Ambulatory Visit | Attending: Chiropractic Medicine | Admitting: Chiropractic Medicine

## 2021-03-01 ENCOUNTER — Encounter (HOSPITAL_COMMUNITY): Payer: Self-pay

## 2021-03-01 ENCOUNTER — Ambulatory Visit (HOSPITAL_COMMUNITY): Payer: BC Managed Care – PPO

## 2021-03-01 DIAGNOSIS — Z87310 Personal history of (healed) osteoporosis fracture: Secondary | ICD-10-CM | POA: Diagnosis not present

## 2021-03-01 DIAGNOSIS — S32000A Wedge compression fracture of unspecified lumbar vertebra, initial encounter for closed fracture: Secondary | ICD-10-CM | POA: Diagnosis not present

## 2021-03-01 DIAGNOSIS — Z8739 Personal history of other diseases of the musculoskeletal system and connective tissue: Secondary | ICD-10-CM | POA: Diagnosis not present

## 2021-03-01 DIAGNOSIS — M4856XA Collapsed vertebra, not elsewhere classified, lumbar region, initial encounter for fracture: Secondary | ICD-10-CM | POA: Diagnosis not present

## 2021-03-01 MED ORDER — TECHNETIUM TC 99M MEDRONATE IV KIT
20.0000 | PACK | Freq: Once | INTRAVENOUS | Status: AC | PRN
  Administered 2021-03-01: 20 via INTRAVENOUS

## 2021-03-06 ENCOUNTER — Other Ambulatory Visit: Payer: Self-pay | Admitting: Endocrinology

## 2021-03-06 ENCOUNTER — Telehealth: Payer: Self-pay | Admitting: Endocrinology

## 2021-03-06 ENCOUNTER — Encounter: Payer: Self-pay | Admitting: Family Medicine

## 2021-03-06 ENCOUNTER — Other Ambulatory Visit (HOSPITAL_BASED_OUTPATIENT_CLINIC_OR_DEPARTMENT_OTHER): Payer: Self-pay | Admitting: Family Medicine

## 2021-03-06 DIAGNOSIS — F988 Other specified behavioral and emotional disorders with onset usually occurring in childhood and adolescence: Secondary | ICD-10-CM

## 2021-03-06 DIAGNOSIS — M810 Age-related osteoporosis without current pathological fracture: Secondary | ICD-10-CM

## 2021-03-06 DIAGNOSIS — Z1231 Encounter for screening mammogram for malignant neoplasm of breast: Secondary | ICD-10-CM

## 2021-03-06 MED ORDER — AMPHETAMINE-DEXTROAMPHETAMINE 20 MG PO TABS: 20.0000 mg | ORAL_TABLET | Freq: Two times a day (BID) | ORAL | 0 refills | Status: DC

## 2021-03-06 NOTE — Telephone Encounter (Signed)
Requesting: Adderall Contract: 11/08/20 UDS: 11/08/20 Last OV: 01/03/21 Next OV: N/A Last Refill: 01/03/21, #60--0 RF Database:   Please advise

## 2021-03-06 NOTE — Telephone Encounter (Signed)
Patient requests an Order for a Bone Density Scan be sent to [facility]. Patient requests to be called at ph# [phone] once the Orders/Referral have been sent to [facility].

## 2021-04-07 ENCOUNTER — Other Ambulatory Visit: Payer: Self-pay | Admitting: Family Medicine

## 2021-04-07 DIAGNOSIS — F988 Other specified behavioral and emotional disorders with onset usually occurring in childhood and adolescence: Secondary | ICD-10-CM

## 2021-04-07 NOTE — Telephone Encounter (Signed)
Requesting: Adderall  Contract: 11/08/20 UDS: 11/08/20 Last OV: 01/03/21 Next OV: N/A Last Refill: 01/03/21, #60--0 RF Database:   Please advise

## 2021-04-10 ENCOUNTER — Encounter (HOSPITAL_BASED_OUTPATIENT_CLINIC_OR_DEPARTMENT_OTHER): Payer: Self-pay

## 2021-04-10 ENCOUNTER — Ambulatory Visit (HOSPITAL_BASED_OUTPATIENT_CLINIC_OR_DEPARTMENT_OTHER)
Admission: RE | Admit: 2021-04-10 | Discharge: 2021-04-10 | Disposition: A | Discharge status: Home / Self Care | Payer: BC Managed Care – PPO | Source: Ambulatory Visit | Attending: Family Medicine | Admitting: Family Medicine

## 2021-04-10 ENCOUNTER — Ambulatory Visit (HOSPITAL_BASED_OUTPATIENT_CLINIC_OR_DEPARTMENT_OTHER)
Admission: RE | Admit: 2021-04-10 | Discharge: 2021-04-10 | Disposition: A | Discharge status: Home / Self Care | Payer: BC Managed Care – PPO | Source: Ambulatory Visit | Attending: Endocrinology | Admitting: Endocrinology

## 2021-04-10 ENCOUNTER — Other Ambulatory Visit: Payer: Self-pay

## 2021-04-10 DIAGNOSIS — M810 Age-related osteoporosis without current pathological fracture: Secondary | ICD-10-CM | POA: Diagnosis not present

## 2021-04-10 DIAGNOSIS — Z1231 Encounter for screening mammogram for malignant neoplasm of breast: Secondary | ICD-10-CM | POA: Diagnosis not present

## 2021-04-10 DIAGNOSIS — Z780 Asymptomatic menopausal state: Secondary | ICD-10-CM | POA: Diagnosis not present

## 2021-04-14 ENCOUNTER — Other Ambulatory Visit: Payer: Self-pay | Admitting: Family Medicine

## 2021-04-14 DIAGNOSIS — R928 Other abnormal and inconclusive findings on diagnostic imaging of breast: Secondary | ICD-10-CM

## 2021-04-28 ENCOUNTER — Other Ambulatory Visit: Payer: Self-pay

## 2021-04-28 ENCOUNTER — Ambulatory Visit: Payer: BC Managed Care – PPO | Admitting: Endocrinology

## 2021-04-28 VITALS — BP 100/70 | HR 71 | Ht 69.0 in | Wt 122.4 lb

## 2021-04-28 DIAGNOSIS — M810 Age-related osteoporosis without current pathological fracture: Secondary | ICD-10-CM

## 2021-04-28 LAB — BASIC METABOLIC PANEL
BUN: 20 mg/dL (ref 6–23)
CO2: 35 mEq/L — ABNORMAL HIGH (ref 19–32)
Calcium: 9.7 mg/dL (ref 8.4–10.5)
Chloride: 98 mEq/L (ref 96–112)
Creatinine, Ser: 0.85 mg/dL (ref 0.40–1.20)
GFR: 72.54 mL/min (ref >60.00)
Glucose, Bld: 100 mg/dL — ABNORMAL HIGH (ref 70–99)
Potassium: 4.2 mEq/L (ref 3.5–5.1)
Sodium: 138 mEq/L (ref 135–145)

## 2021-04-28 LAB — VITAMIN D 25 HYDROXY (VIT D DEFICIENCY, FRACTURES): VITD: 67.57 ng/mL (ref 30.00–100.00)

## 2021-04-28 LAB — TSH: TSH: 0.41 u[IU]/mL (ref 0.35–5.50)

## 2021-04-28 LAB — T4, FREE: Free T4: 1.02 ng/dL (ref 0.60–1.60)

## 2021-04-28 NOTE — Patient Instructions (Addendum)
Blood tests are requested for you today.  We'll let you know about the result. Please continue the same calcitonin. You should consider a medication called "Evenity."  This is a once a month shot at the office, given x 1 year.  Please let us know If the breast mass turns out to be nothing, estrogen could be considered, but [name] is a better option.   Please come back for a follow-up appointment in 1 year.

## 2021-04-28 NOTE — Progress Notes (Signed)
Subjective:    Patient ID: Tracy Cook, female    DOB: 1956-03-28, 65 y.o.   MRN: [id number]  HPI Pt returns for f/u of osteoporosis: Dx'ed: 2016 Secondary cause: Vit D def.   Fractures: vertebral compression fxs (nontraumatic); pelvis and left index finger (traumatic).   Past rx: Fosamax x a few mos in 2016 (stopped due to rash); She did not tolerate Prolia (flu like sxs) or teriperatide (headache).   Current rx: calcitonin nasal spray since 2021 Last DEXA result (2022): T-score was -3.6 (RAD).   Other: she had kyphoplasty in the past, but declined for 2022 comp fx.   Interval hx: she has had further compression fractures.  She chose to stop the raloxifene, but does not recall the reason.   Past Medical History:  Diagnosis Date   Cardiomyopathy ([place])    Diverticulosis    Insomnia    Menopause    Migraine    Orthostatic hypotension    Osteoporosis    Other malaise and fatigue    Persistent disorder of initiating or maintaining sleep    POTS (postural orthostatic tachycardia syndrome)    Tachycardia, unspecified     Past Surgical History:  Procedure Laterality Date   COLONOSCOPY  2013   with Dr. Collene Cook   FINGER SURGERY Left    index-fusion   IR KYPHO LUMBAR INC FX REDUCE BONE BX UNI/BIL CANNULATION INC/IMAGING  11/11/2019   IR KYPHO LUMBAR INC FX REDUCE BONE BX UNI/BIL CANNULATION INC/IMAGING  12/17/2019   IR RADIOLOGIST EVAL & MGMT  11/05/2019   IR RADIOLOGIST EVAL & MGMT  12/09/2019   No prior surgery     WISDOM TOOTH EXTRACTION  1974    Social History   Socioeconomic History   Marital status: Married    Spouse name: Not on file   Number of children: 2   Years of education: Not on file   Highest education level: Not on file  Occupational History    Comment: Medial lab technologist  Tobacco Use   Smoking status: Never   Smokeless tobacco: Never  Vaping Use   Vaping Use: Never used  Substance and Sexual Activity   Alcohol use: No   Drug use: No   Sexual  activity: Yes    Partners: Male  Other Topics Concern   Not on file  Social History Narrative   Exercise almost every day   Social Determinants of Health   Financial Resource Strain: Not on file  Food Insecurity: Not on file  Transportation Needs: Not on file  Physical Activity: Not on file  Stress: Not on file  Social Connections: Not on file  Intimate Partner Violence: Not on file    Current Outpatient Medications on File Prior to Visit  Medication Sig Dispense Refill   amphetamine-dextroamphetamine (ADDERALL) 20 MG tablet Take 1 tablet (20 mg total) by mouth 2 (two) times daily. 60 tablet 0   amphetamine-dextroamphetamine (ADDERALL) 20 MG tablet Take 1 tablet (20 mg total) by mouth 2 (two) times daily. 60 tablet 0   amphetamine-dextroamphetamine (ADDERALL) 20 MG tablet TAKE ONE (1) TABLET BY MOUTH TWO (2) TIMES DAILY 60 tablet 0   aspirin EC 81 MG tablet Take 1 tablet (81 mg total) by mouth daily. Swallow whole. 90 tablet 3   calcitonin, salmon, (MIACALCIN/FORTICAL) 200 UNIT/ACT nasal spray Place 1 spray into alternate nostrils daily. 3.7 mL 12   escitalopram (LEXAPRO) 10 MG tablet Take 1 tablet (10 mg total) by mouth daily. 90 tablet 3  ondansetron (ZOFRAN) 4 MG tablet Zofran 4 mg tablet  Take 1 tablet 3 times a day by oral route as needed.     propranolol (INDERAL) 20 MG tablet Take 1 tablet (20 mg total) by mouth 3 (three) times daily. 270 tablet 3   SUMAtriptan (IMITREX) 100 MG tablet SEE NOTES 12 tablet 1   tiZANidine (ZANAFLEX) 4 MG tablet Take 4 mg by mouth every 8 (eight) hours as needed for muscle spasms.     No current facility-administered medications on file prior to visit.    Allergies  Allergen Reactions   Alendronate Sodium Rash   Codeine Nausea And Vomiting   Hydrocodone-Acetaminophen Nausea And Vomiting    Can take with zofran    Parathyroid Hormone (Recomb) Other (See Comments)    Bone pain and headaches Bone pain and headaches   Vicodin  [Hydrocodone-Acetaminophen] Nausea And Vomiting    Family History  Problem Relation Age of Onset   Coronary artery disease Father        Died of MI at age 62   Asthma Father    Hypertension Father    Sudden death Father    Heart attack Father    Stroke Mother    Osteoporosis Mother    Colon polyps Mother 29   Osteoporosis Sister    Diabetes Neg Hx    Hyperlipidemia Neg Hx    Colon cancer Neg Hx    Esophageal cancer Neg Hx    Stomach cancer Neg Hx    Rectal cancer Neg Hx     BP 100/70 (BP Location: Right Arm, Patient Position: Sitting, Cuff Size: Normal)   Pulse 71   Ht 5\' 9"  (1.753 m)   Wt 122 lb 6.4 oz (55.5 kg)   SpO2 97%   BMI 18.08 kg/m   Review of Systems Denies falls.      Objective:   Physical Exam VITAL SIGNS:  See vs page GENERAL: no distress GAIT: normal and steady  Mammography: Further evaluation is suggested for a possible mass in the left breast.   Lab Results  Component Value Date   CREATININE 0.93 10/05/2019   BUN 20 10/05/2019   NA 143 10/05/2019   K 4.5 10/05/2019   CL 110 (H) 10/05/2019   CO2 19 (L) 10/05/2019   Lab Results  Component Value Date   TSH 0.86 02/05/2020       Assessment & Plan:  Osteoporosis: uncontrolled.  I advised Evenity.   Breast mass: I told pt we cannot start E2 in view of this  Patient Instructions  Blood tests are requested for you today.  We'll let you know about the result. Please continue the same calcitonin. You should consider a medication called "Evenity."  This is a once a month shot at the office, given x 1 year.  Please let us know If the breast mass turns out to be nothing, estrogen could be considered, but Tracy Cook is a better option.   Please come back for a follow-up appointment in 1 year.

## 2021-05-01 ENCOUNTER — Ambulatory Visit
Admission: RE | Admit: 2021-05-01 | Discharge: 2021-05-01 | Disposition: A | Discharge status: Home / Self Care | Payer: BC Managed Care – PPO | Source: Ambulatory Visit | Attending: Family Medicine | Admitting: Family Medicine

## 2021-05-01 ENCOUNTER — Other Ambulatory Visit: Payer: Self-pay | Admitting: Family Medicine

## 2021-05-01 ENCOUNTER — Other Ambulatory Visit: Payer: Self-pay

## 2021-05-01 DIAGNOSIS — R922 Inconclusive mammogram: Secondary | ICD-10-CM | POA: Diagnosis not present

## 2021-05-01 DIAGNOSIS — R928 Other abnormal and inconclusive findings on diagnostic imaging of breast: Secondary | ICD-10-CM

## 2021-05-01 LAB — PTH, INTACT AND CALCIUM
Calcium: 9.9 mg/dL (ref 8.6–10.4)
PTH: 37 pg/mL (ref 16–77)

## 2021-05-02 DIAGNOSIS — G894 Chronic pain syndrome: Secondary | ICD-10-CM | POA: Diagnosis not present

## 2021-05-08 ENCOUNTER — Other Ambulatory Visit: Payer: Self-pay | Admitting: Family Medicine

## 2021-05-08 NOTE — Telephone Encounter (Signed)
Requesting: Adderall 20mg  Contract: 11/08/2020 UDS: 11/08/2020 Last Visit: 01/03/2021 Next Visit: 06/05/2021  Last Refill: 04/07/2021 #60 and 0RF  Please Advise

## 2021-05-10 ENCOUNTER — Ambulatory Visit
Admission: RE | Admit: 2021-05-10 | Discharge: 2021-05-10 | Disposition: A | Discharge status: Home / Self Care | Payer: BC Managed Care – PPO | Source: Ambulatory Visit | Attending: Family Medicine | Admitting: Family Medicine

## 2021-05-10 ENCOUNTER — Other Ambulatory Visit: Payer: Self-pay

## 2021-05-10 DIAGNOSIS — N6012 Diffuse cystic mastopathy of left breast: Secondary | ICD-10-CM | POA: Diagnosis not present

## 2021-05-10 DIAGNOSIS — N6323 Unspecified lump in the left breast, lower outer quadrant: Secondary | ICD-10-CM | POA: Diagnosis not present

## 2021-05-10 DIAGNOSIS — R928 Other abnormal and inconclusive findings on diagnostic imaging of breast: Secondary | ICD-10-CM

## 2021-05-12 ENCOUNTER — Other Ambulatory Visit: Payer: Self-pay | Admitting: Family Medicine

## 2021-05-12 DIAGNOSIS — N6320 Unspecified lump in the left breast, unspecified quadrant: Secondary | ICD-10-CM

## 2021-05-12 DIAGNOSIS — N63 Unspecified lump in unspecified breast: Secondary | ICD-10-CM

## 2021-05-22 ENCOUNTER — Ambulatory Visit
Admission: RE | Admit: 2021-05-22 | Discharge: 2021-05-22 | Disposition: A | Discharge status: Home / Self Care | Payer: BC Managed Care – PPO | Source: Ambulatory Visit | Attending: Family Medicine | Admitting: Family Medicine

## 2021-05-22 ENCOUNTER — Other Ambulatory Visit: Payer: Self-pay

## 2021-05-22 DIAGNOSIS — N6313 Unspecified lump in the right breast, lower outer quadrant: Secondary | ICD-10-CM | POA: Diagnosis not present

## 2021-05-22 DIAGNOSIS — N6012 Diffuse cystic mastopathy of left breast: Secondary | ICD-10-CM | POA: Diagnosis not present

## 2021-05-22 DIAGNOSIS — N6320 Unspecified lump in the left breast, unspecified quadrant: Secondary | ICD-10-CM

## 2021-05-22 DIAGNOSIS — N63 Unspecified lump in unspecified breast: Secondary | ICD-10-CM

## 2021-06-05 ENCOUNTER — Ambulatory Visit (INDEPENDENT_AMBULATORY_CARE_PROVIDER_SITE_OTHER): Payer: BC Managed Care – PPO | Admitting: Family Medicine

## 2021-06-05 ENCOUNTER — Encounter: Payer: Self-pay | Admitting: Family Medicine

## 2021-06-05 ENCOUNTER — Other Ambulatory Visit: Payer: Self-pay

## 2021-06-05 VITALS — BP 100/80 | HR 82 | Temp 98.7°F | Resp 18 | Ht 69.0 in | Wt 121.4 lb

## 2021-06-05 DIAGNOSIS — Z79899 Other long term (current) drug therapy: Secondary | ICD-10-CM | POA: Diagnosis not present

## 2021-06-05 DIAGNOSIS — M549 Dorsalgia, unspecified: Secondary | ICD-10-CM | POA: Diagnosis not present

## 2021-06-05 DIAGNOSIS — F988 Other specified behavioral and emotional disorders with onset usually occurring in childhood and adolescence: Secondary | ICD-10-CM | POA: Diagnosis not present

## 2021-06-05 MED ORDER — AMPHETAMINE-DEXTROAMPHETAMINE 20 MG PO TABS: ORAL_TABLET | ORAL | 0 refills | Status: DC

## 2021-06-05 MED ORDER — TRAMADOL HCL 50 MG PO TABS
50.0000 mg | ORAL_TABLET | Freq: Three times a day (TID) | ORAL | 0 refills | Status: AC | PRN
Start: 2021-06-05 — End: 2021-06-10

## 2021-06-05 NOTE — Assessment & Plan Note (Signed)
Update uds and cotract refilll adderall

## 2021-06-05 NOTE — Patient Instructions (Signed)
Thoracic Strain A thoracic strain, which is sometimes called a mid-back strain, is an injury to the muscles or tendons that attach to the upper part of your back behind your chest. This type of injury occurs when a muscle is overstretched or overloaded. Thoracic strains can range from mild to severe. Mild strains may involve stretching a muscle or tendon without tearing it. These injuries may heal in 1-2 weeks. More severe strains involve tearing of muscle fibers or tendons. These will cause more pain and may take 6-8 weeks to heal. What are the causes? This condition may be caused by: Trauma, such as a fall or a hit to the body. Twisting or overstretching the back. This may result from doing activities that require a lot of energy, such as lifting heavy objects. In some cases, the cause may not be known. What increases the risk? This injury is more common in: Athletes. People with obesity. What are the signs or symptoms? The main symptom of this condition is pain in the middle back, especially with movement. Other symptoms include: Stiffness or limited range of motion. Sudden muscle tightening (spasms). How is this diagnosed? This condition may be diagnosed based on: Your symptoms. Your medical history. A physical exam. Imaging tests, such as X-rays or an MRI. How is this treated? This condition may be treated with: Resting the injured area. Applying heat and cold to the injured area. Over-the-counter medicines for pain and inflammation, such as NSAIDs. Prescription pain medicine or muscle relaxants may be needed for a short time. Physical therapy. This will involve doing stretching and strengthening exercises. Follow these instructions at home: Managing pain, stiffness, and swelling   If directed, put ice on the injured area. Put ice in a plastic bag. Place a towel between your skin and the bag. Leave the ice on for 20 minutes, 2-3 times a day. If directed, apply heat to the  affected area as often as told by your health care provider. Use the heat source that your health care provider recommends, such as a moist heat pack or a heating pad. Place a towel between your skin and the heat source. Leave the heat on for 20-30 minutes. Remove the heat if your skin turns bright red. This is especially important if you are unable to feel pain, heat, or cold. You may have a greater risk of getting burned. Activity Rest and return to your normal activities as told by your health care provider. Ask your health care provider what activities are safe for you. Do exercises as told by your health care provider. Medicines Take over-the-counter and prescription medicines only as told by your health care provider. Ask your health care provider if the medicine prescribed to you: Requires you to avoid driving or using heavy machinery. Can cause constipation. You may need to take these actions to prevent or treat constipation: Drink enough fluid to keep your urine pale yellow. Take over-the-counter or prescription medicines. Eat foods that are high in fiber, such as beans, whole grains, and fresh fruits and vegetables. Limit foods that are high in fat and processed sugars, such as fried or sweet foods. Injury prevention To prevent a future mid-back injury: Always warm up properly before physical activity or sports. Cool down and stretch after being active. Use correct form when playing sports and lifting heavy objects. Bend your knees before you lift heavy objects. Use good posture when sitting and standing. Stay physically fit and maintain a healthy weight. Do at least 150 minutes  of moderate-intensity exercise each week, such as brisk walking or water aerobics. Do strength exercises at least 2 times each week.  General instructions Do not use any products that contain nicotine or tobacco, such as cigarettes, e-cigarettes, and chewing tobacco. If you need help quitting, ask your  health care provider. Keep all follow-up visits as told by your health care provider. This is important. Contact a health care provider if: Your pain is not helped by medicine. Your pain or stiffness is getting worse. You develop pain or stiffness in your neck or lower back. Get help right away if you: Have shortness of breath. Have chest pain. Develop numbness or weakness in your legs or arms. Have involuntary loss of urine (urinary incontinence). Summary A thoracic strain, which is sometimes called a mid-back strain, is an injury to the muscles or tendons that attach to the upper part of your back behind your chest. This type of injury occurs when a muscle is overstretched or overloaded. Rest and return to your normal activities as told by your health care provider. If directed, apply heat or ice to the affected area as often as told by your health care provider. Take over-the-counter and prescription medicines only as told by your health care provider. Contact a health care provider if you have new or worsening symptoms. This information is not intended to replace advice given to you by your health care provider. Make sure you discuss any questions you have with your health care provider. Document Revised: 05/27/2018 Document Reviewed: 05/27/2018 Elsevier Patient Education  2022 [employer].

## 2021-06-05 NOTE — Assessment & Plan Note (Signed)
F/u ortho con't tizanidine but 1-2 , tid prn Ultram 50 mg limited supply Finish prednisone

## 2021-06-05 NOTE — Progress Notes (Addendum)
Subjective:   By signing my name below, I, Tracy Cook, attest that this documentation has been prepared under the direction and in the presence of Tracy Held, DO. 06/05/2021      Patient ID: Tracy Cook, female    DOB: [birth year], 65 y.o.   MRN: [id number]  Chief Complaint  Patient presents with   Back Pain    Pt states having muscle spasms and mid back for x2 weeks. Pt states taking Tizandine and states it helps but still having pain. Pt using Prednisone starting last week.      HPI Patient is in today for an office visit.  She reports she has been having back pain for the past 2 weeks after she had to sit in a mammogram chair for a while. The pain is located in the left and radiates down the back. She has been using tizanidine and prednisone but mentions it is still getting worse. She is trying to limit her usage of percocet because it causes side effects like migraine and constipation. The prednisone taper was prescribed by an online doctor. She adds that the tizanidine helps a little and she only takes it in the night because it makes her sleepy and lowers her blood pressure significantly. She uses 3 tablets of 4 mg tizanidine 3x daily. The pain does not radiate down to her legs.   She has a history of compression fractures and notes that some of them fixed while the others were left to heal on their own.   She is requesting a refill on 20 mg adderall.    Past Medical History:  Diagnosis Date   Cardiomyopathy ([place])    Diverticulosis    Insomnia    Menopause    Migraine    Orthostatic hypotension    Osteoporosis    Other malaise and fatigue    Persistent disorder of initiating or maintaining sleep    POTS (postural orthostatic tachycardia syndrome)    Tachycardia, unspecified     Past Surgical History:  Procedure Laterality Date   COLONOSCOPY  2013   with Dr. Collene Mares   FINGER SURGERY Left    index-fusion   IR KYPHO LUMBAR INC FX REDUCE BONE BX UNI/BIL  CANNULATION INC/IMAGING  11/11/2019   IR KYPHO LUMBAR INC FX REDUCE BONE BX UNI/BIL CANNULATION INC/IMAGING  12/17/2019   IR RADIOLOGIST EVAL & MGMT  11/05/2019   IR RADIOLOGIST EVAL & MGMT  12/09/2019   No prior surgery     WISDOM TOOTH EXTRACTION  1974    Family History  Problem Relation Age of Onset   Coronary artery disease Father        Died of MI at age 64   Asthma Father    Hypertension Father    Sudden death Father    Heart attack Father    Stroke Mother    Osteoporosis Mother    Colon polyps Mother 8   Osteoporosis Sister    Diabetes Neg Hx    Hyperlipidemia Neg Hx    Colon cancer Neg Hx    Esophageal cancer Neg Hx    Stomach cancer Neg Hx    Rectal cancer Neg Hx     Social History   Socioeconomic History   Marital status: Married    Spouse name: Not on file   Number of children: 2   Years of education: Not on file   Highest education level: Not on file  Occupational History    Comment: Medial  lab technologist  Tobacco Use   Smoking status: Never   Smokeless tobacco: Never  Vaping Use   Vaping Use: Never used  Substance and Sexual Activity   Alcohol use: No   Drug use: No   Sexual activity: Yes    Partners: Male  Other Topics Concern   Not on file  Social History Narrative   Exercise almost every day   Social Determinants of Health   Financial Resource Strain: Not on file  Food Insecurity: Not on file  Transportation Needs: Not on file  Physical Activity: Not on file  Stress: Not on file  Social Connections: Not on file  Intimate Partner Violence: Not on file    Outpatient Medications Prior to Visit  Medication Sig Dispense Refill   amphetamine-dextroamphetamine (ADDERALL) 20 MG tablet TAKE ONE (1) TABLET BY MOUTH TWO (2) TIMES DAILY 60 tablet 0   calcitonin, salmon, (MIACALCIN/FORTICAL) 200 UNIT/ACT nasal spray Place 1 spray into alternate nostrils daily. 3.7 mL 12   escitalopram (LEXAPRO) 10 MG tablet Take 1 tablet (10 mg total) by mouth  daily. 90 tablet 3   propranolol (INDERAL) 20 MG tablet Take 1 tablet (20 mg total) by mouth 3 (three) times daily. 270 tablet 3   SUMAtriptan (IMITREX) 100 MG tablet SEE NOTES 12 tablet 1   tiZANidine (ZANAFLEX) 4 MG tablet Take 4 mg by mouth every 8 (eight) hours as needed for muscle spasms.     amphetamine-dextroamphetamine (ADDERALL) 20 MG tablet TAKE ONE (1) TABLET BY MOUTH TWO (2) TIMES DAILY 60 tablet 0   aspirin EC 81 MG tablet Take 1 tablet (81 mg total) by mouth daily. Swallow whole. 90 tablet 3   ondansetron (ZOFRAN) 4 MG tablet Zofran 4 mg tablet  Take 1 tablet 3 times a day by oral route as needed.     No facility-administered medications prior to visit.    Allergies  Allergen Reactions   Alendronate Sodium Rash   Codeine Nausea And Vomiting   Hydrocodone-Acetaminophen Nausea And Vomiting    Can take with zofran    Parathyroid Hormone (Recomb) Other (See Comments)    Bone pain and headaches Bone pain and headaches   Vicodin [Hydrocodone-Acetaminophen] Nausea And Vomiting    Review of Systems  Constitutional:  Negative for fever.  HENT:  Negative for congestion, ear pain, hearing loss, sinus pain and sore throat.   Eyes:  Negative for blurred vision and pain.  Respiratory:  Negative for cough, sputum production, shortness of breath and wheezing.   Cardiovascular:  Negative for chest pain and palpitations.  Gastrointestinal:  Negative for blood in stool, constipation, diarrhea, nausea and vomiting.  Genitourinary:  Negative for dysuria, frequency, hematuria and urgency.  Musculoskeletal:  Positive for back pain. Negative for falls and myalgias.       (+) muscle spasms   Neurological:  Negative for dizziness, sensory change, loss of consciousness, weakness and headaches.  Endo/Heme/Allergies:  Negative for environmental allergies. Does not bruise/bleed easily.  Psychiatric/Behavioral:  Negative for depression and suicidal ideas. The patient is not nervous/anxious and  does not have insomnia.       Objective:    Physical Exam Vitals and nursing note reviewed.  Constitutional:      General: She is not in acute distress.    Appearance: Normal appearance. She is not ill-appearing.  HENT:     Head: Normocephalic and atraumatic.     Right Ear: External ear normal.     Left Ear: External ear normal.  Eyes:     Extraocular Movements: Extraocular movements intact.     Pupils: Pupils are equal, round, and reactive to light.  Cardiovascular:     Rate and Rhythm: Normal rate and regular rhythm.     Pulses: Normal pulses.     Heart sounds: Normal heart sounds. No murmur heard.   No gallop.  Pulmonary:     Effort: Pulmonary effort is normal. No respiratory distress.     Breath sounds: Normal breath sounds. No wheezing, rhonchi or rales.  Abdominal:     General: Bowel sounds are normal. There is no distension.     Palpations: Abdomen is soft. There is no mass.     Tenderness: There is no abdominal tenderness. There is no guarding or rebound.     Hernia: No hernia is present.  Musculoskeletal:        General: Tenderness present. No swelling or deformity.     Cervical back: Normal range of motion and neck supple.     Thoracic back: Spasms and tenderness present. Decreased range of motion.     Lumbar back: Normal.  Lymphadenopathy:     Cervical: No cervical adenopathy.  Skin:    General: Skin is warm and dry.  Neurological:     General: No focal deficit present.     Mental Status: She is alert and oriented to person, place, and time.     Motor: No weakness.     Gait: Gait is intact.     Deep Tendon Reflexes: Reflexes are normal and symmetric.  Psychiatric:        Behavior: Behavior normal.    BP 100/80 (BP Location: Right Arm, Patient Position: Sitting, Cuff Size: Small)   Pulse 82   Temp 98.7 F (37.1 C) (Oral)   Resp 18   Ht 5\' 9"  (1.753 m)   Wt 121 lb 6.4 oz (55.1 kg)   SpO2 99%   BMI 17.93 kg/m  Wt Readings from Last 3 Encounters:   06/05/21 121 lb 6.4 oz (55.1 kg)  04/28/21 122 lb 6.4 oz (55.5 kg)  01/03/21 123 lb 3.2 oz (55.9 kg)    Diabetic Foot Exam - Simple   No data filed    Lab Results  Component Value Date   WBC 5.6 02/05/2020   HGB 12.7 02/05/2020   HCT 38.7 02/05/2020   PLT 239.0 02/05/2020   GLUCOSE 100 (H) 04/28/2021   CHOL 149 02/05/2020   TRIG 98.0 02/05/2020   HDL 63.60 02/05/2020   LDLCALC 66 02/05/2020   ALT 24 02/07/2017   AST 22 02/07/2017   NA 138 04/28/2021   K 4.2 04/28/2021   CL 98 04/28/2021   CREATININE 0.85 04/28/2021   BUN 20 04/28/2021   CO2 35 (H) 04/28/2021   TSH 0.41 04/28/2021   INR 1.0 12/17/2019   HGBA1C 5.8 02/07/2017   MICROALBUR <0.7 11/13/2016    Lab Results  Component Value Date   TSH 0.41 04/28/2021   Lab Results  Component Value Date   WBC 5.6 02/05/2020   HGB 12.7 02/05/2020   HCT 38.7 02/05/2020   MCV 92.2 02/05/2020   PLT 239.0 02/05/2020   Lab Results  Component Value Date   NA 138 04/28/2021   K 4.2 04/28/2021   CO2 35 (H) 04/28/2021   GLUCOSE 100 (H) 04/28/2021   BUN 20 04/28/2021   CREATININE 0.85 04/28/2021   BILITOT 0.5 02/07/2017   ALKPHOS 53 02/07/2017   AST 22 02/07/2017   ALT 24  02/07/2017   PROT 7.0 02/05/2020   ALBUMIN 4.5 02/07/2017   CALCIUM 9.9 04/28/2021   CALCIUM 9.7 04/28/2021   GFR 72.54 04/28/2021   Lab Results  Component Value Date   CHOL 149 02/05/2020   Lab Results  Component Value Date   HDL 63.60 02/05/2020   Lab Results  Component Value Date   LDLCALC 66 02/05/2020   Lab Results  Component Value Date   TRIG 98.0 02/05/2020   Lab Results  Component Value Date   CHOLHDL 2 02/05/2020   Lab Results  Component Value Date   HGBA1C 5.8 02/07/2017       Mammogram: Last checked on 04/10/2021. There was a possible mass in the left breast that warrants further evaluation. Pap Smear: Last checked on 02/01/2020. Results were negative and normal. Repeat in 3 years. Dexa: Last checked on  04/10/2021.  Patient is osteoporotic according to the [facility]. Repeat in 3 years. Colonoscopy: Last completed on 03/25/2020. There were hemorrhoids in the perianal exam. Polyps were found in the sigmoid, descending, transverse colon and cecum which were resected and retried. There was also torturous colon with significant looping. Repeat in 3 years.  Assessment & Plan:   Problem List Items Addressed This Visit       Unprioritized   Attention deficit disorder (ADD) without hyperactivity    Update uds and cotract refilll adderall      Relevant Medications   amphetamine-dextroamphetamine (ADDERALL) 20 MG tablet   Mid back pain on left side - Primary    F/u ortho con't tizanidine but 1-2 , tid prn Ultram 50 mg limited supply Finish prednisone       Relevant Medications   traMADol (ULTRAM) 50 MG tablet   Other Visit Diagnoses     High risk medication use       Relevant Orders   Drug Monitoring Panel [phone], Urine       Meds ordered this encounter  Medications   amphetamine-dextroamphetamine (ADDERALL) 20 MG tablet    Sig: TAKE ONE (1) TABLET BY MOUTH TWO (2) TIMES DAILY    Dispense:  60 tablet    Refill:  0   traMADol (ULTRAM) 50 MG tablet    Sig: Take 1 tablet (50 mg total) by mouth every 8 (eight) hours as needed for up to 5 days.    Dispense:  15 tablet    Refill:  0    I,Tracy Cook,acting as a scribe for [employer], DO.,have documented all relevant documentation on the behalf of Tracy Held, DO,as directed by  Tracy Held, DO while in the presence of Tracy Cook, [place], DO., personally preformed the services described in this documentation.  All medical record entries made by the scribe were at my direction and in my presence.  I have reviewed the chart and discharge instructions (if applicable) and agree that the record reflects my personal performance and is accurate and complete.  06/05/2021

## 2021-06-08 LAB — DRUG MONITORING PANEL 375977 , URINE
Alcohol Metabolites: NEGATIVE ng/mL (ref <500)
Amphetamine: 1081 ng/mL — ABNORMAL HIGH (ref <250)
Amphetamines: POSITIVE ng/mL — AB (ref <500)
Barbiturates: NEGATIVE ng/mL (ref <300)
Benzodiazepines: NEGATIVE ng/mL (ref <100)
Cocaine Metabolite: NEGATIVE ng/mL (ref <150)
Desmethyltramadol: NEGATIVE ng/mL (ref <100)
Marijuana Metabolite: NEGATIVE ng/mL (ref <20)
Methamphetamine: NEGATIVE ng/mL (ref <250)
Opiates: NEGATIVE ng/mL (ref <100)
Oxycodone: NEGATIVE ng/mL (ref <100)
Tramadol: NEGATIVE ng/mL (ref <100)

## 2021-06-08 LAB — DM TEMPLATE

## 2021-06-19 ENCOUNTER — Encounter: Payer: Self-pay | Admitting: Endocrinology

## 2021-06-21 ENCOUNTER — Telehealth: Payer: BC Managed Care – PPO | Admitting: Family Medicine

## 2021-06-21 ENCOUNTER — Encounter: Payer: Self-pay | Admitting: Family Medicine

## 2021-06-21 ENCOUNTER — Other Ambulatory Visit: Payer: Self-pay

## 2021-06-21 DIAGNOSIS — J069 Acute upper respiratory infection, unspecified: Secondary | ICD-10-CM

## 2021-06-21 MED ORDER — PROMETHAZINE-DM 6.25-15 MG/5ML PO SYRP
5.0000 mL | ORAL_SOLUTION | Freq: Four times a day (QID) | ORAL | 0 refills | Status: DC | PRN
Start: 2021-06-21 — End: 2021-11-30

## 2021-06-21 MED ORDER — BENZONATATE 200 MG PO CAPS: 200.0000 mg | ORAL_CAPSULE | Freq: Two times a day (BID) | ORAL | 0 refills | Status: DC | PRN

## 2021-06-21 NOTE — Progress Notes (Signed)
Chief Complaint  Patient presents with   Sore Throat    Fever Congestion     Tracy Cook here for URI complaints. Due to COVID-19 pandemic, we are interacting via web portal for an electronic face-to-face visit. I verified patient's ID using 2 identifiers. Patient agreed to proceed with visit via this method. Patient is at home, I am at office. Patient and I are present for visit.   Duration: 3 days  Associated symptoms: Fever (89 F), sinus congestion, rhinorrhea, ear pain, sore throat, chest pain, and coughing Denies: sinus pain, itchy watery eyes, ear drainage, wheezing, shortness of breath, and N/V/D, loss of taste/smell Treatment to date: Percocet Sick contacts: Yes  Past Medical History:  Diagnosis Date   Cardiomyopathy ([place])    Diverticulosis    Insomnia    Menopause    Migraine    Orthostatic hypotension    Osteoporosis    Other malaise and fatigue    Persistent disorder of initiating or maintaining sleep    POTS (postural orthostatic tachycardia syndrome)    Tachycardia, unspecified     Objective No conversational dyspnea Age appropriate judgment and insight Nml affect and mood  Viral URI with cough - Plan: benzonatate (TESSALON) 200 MG capsule, promethazine-dextromethorphan (PROMETHAZINE-DM) 6.25-15 MG/5ML syrup  Warnings about syrup verbalized. Both prn, may only need one. Outside of window for Tamiflu. Rec'd testing for covid.  Continue to push fluids, practice good hand hygiene, cover mouth when coughing. F/u prn. If starting to experience fevers, shaking, or shortness of breath, seek immediate care. Pt voiced understanding and agreement to the plan.  [place], DO 06/21/21 10:16 AM

## 2021-07-01 DIAGNOSIS — M546 Pain in thoracic spine: Secondary | ICD-10-CM | POA: Diagnosis not present

## 2021-07-05 ENCOUNTER — Other Ambulatory Visit: Payer: Self-pay | Admitting: Family Medicine

## 2021-07-05 DIAGNOSIS — F988 Other specified behavioral and emotional disorders with onset usually occurring in childhood and adolescence: Secondary | ICD-10-CM

## 2021-07-05 NOTE — Telephone Encounter (Signed)
Requesting:adderall 20mg  Contract: UDS: Last Visit: Next Visit: Last Refill:  Please Advise

## 2021-07-05 NOTE — Telephone Encounter (Signed)
Requesting:adderall 20 mg Contract:06/05/21 UDS:06/05/21 Last Visit:06/05/21 Next Visit:unknown Last Refill:06/05/21  Please Advise

## 2021-07-11 DIAGNOSIS — M546 Pain in thoracic spine: Secondary | ICD-10-CM | POA: Diagnosis not present

## 2021-08-29 ENCOUNTER — Encounter: Payer: Self-pay | Admitting: Family Medicine

## 2021-08-29 DIAGNOSIS — F988 Other specified behavioral and emotional disorders with onset usually occurring in childhood and adolescence: Secondary | ICD-10-CM

## 2021-08-30 MED ORDER — AMPHETAMINE-DEXTROAMPHETAMINE 20 MG PO TABS: 20.0000 mg | ORAL_TABLET | Freq: Two times a day (BID) | ORAL | 0 refills | Status: DC

## 2021-08-30 NOTE — Telephone Encounter (Signed)
[place] Pt.   Requesting: Adderall 20mg   Contract: 06/05/2021 UDS: 06/05/2021 Last Visit: 06/05/2021 Next Visit: None Last Refill: 07/06/2021 #60 and 0RF  Please Advise

## 2021-08-30 NOTE — Telephone Encounter (Signed)
PDMP shows that she was dispensed 54 tablets (enough for 27 days)  back in August 05, 2021, is few days early but will send the prescription.

## 2021-10-02 ENCOUNTER — Other Ambulatory Visit: Payer: Self-pay | Admitting: Internal Medicine

## 2021-10-02 DIAGNOSIS — F988 Other specified behavioral and emotional disorders with onset usually occurring in childhood and adolescence: Secondary | ICD-10-CM

## 2021-10-02 MED ORDER — AMPHETAMINE-DEXTROAMPHETAMINE 20 MG PO TABS: 20.0000 mg | ORAL_TABLET | Freq: Two times a day (BID) | ORAL | 0 refills | Status: DC

## 2021-10-02 NOTE — Telephone Encounter (Signed)
Requesting: Adderall '20mg'$   ?Contract: 06/05/2021 ?UDS: 06/05/2021 ?Last Visit: 06/05/2021 ?Next Visit: None ?Last Refill: 08/30/2021 #60 and 0RF ? ?Please Advise ? ?

## 2021-11-03 ENCOUNTER — Other Ambulatory Visit: Payer: Self-pay | Admitting: Family Medicine

## 2021-11-03 DIAGNOSIS — F988 Other specified behavioral and emotional disorders with onset usually occurring in childhood and adolescence: Secondary | ICD-10-CM

## 2021-11-03 MED ORDER — AMPHETAMINE-DEXTROAMPHETAMINE 20 MG PO TABS: 20.0000 mg | ORAL_TABLET | Freq: Two times a day (BID) | ORAL | 0 refills | Status: DC

## 2021-11-03 NOTE — Telephone Encounter (Signed)
Requesting: Adderall '20mg'$  ?Contract: 06/05/21 ?UDS: 06/05/21 ?Last Visit: 06/05/21 ?Next Visit: none ?Last Refill: 10/02/21 ? ?Please Advise ? ?

## 2021-11-05 DIAGNOSIS — M546 Pain in thoracic spine: Secondary | ICD-10-CM | POA: Diagnosis not present

## 2021-11-05 DIAGNOSIS — M5136 Other intervertebral disc degeneration, lumbar region: Secondary | ICD-10-CM | POA: Diagnosis not present

## 2021-11-28 ENCOUNTER — Ambulatory Visit: Payer: BC Managed Care – PPO | Admitting: Family Medicine

## 2021-11-30 ENCOUNTER — Ambulatory Visit (INDEPENDENT_AMBULATORY_CARE_PROVIDER_SITE_OTHER): Payer: BC Managed Care – PPO | Admitting: Family Medicine

## 2021-11-30 ENCOUNTER — Encounter: Payer: Self-pay | Admitting: Family Medicine

## 2021-11-30 VITALS — BP 112/80 | HR 98 | Temp 97.7°F | Resp 18 | Ht 69.0 in | Wt 117.8 lb

## 2021-11-30 DIAGNOSIS — Z79899 Other long term (current) drug therapy: Secondary | ICD-10-CM

## 2021-11-30 DIAGNOSIS — F988 Other specified behavioral and emotional disorders with onset usually occurring in childhood and adolescence: Secondary | ICD-10-CM | POA: Diagnosis not present

## 2021-11-30 DIAGNOSIS — M810 Age-related osteoporosis without current pathological fracture: Secondary | ICD-10-CM

## 2021-11-30 DIAGNOSIS — R531 Weakness: Secondary | ICD-10-CM | POA: Insufficient documentation

## 2021-11-30 DIAGNOSIS — F1193 Opioid use, unspecified with withdrawal: Secondary | ICD-10-CM

## 2021-11-30 DIAGNOSIS — G43809 Other migraine, not intractable, without status migrainosus: Secondary | ICD-10-CM

## 2021-11-30 MED ORDER — CLONAZEPAM 0.5 MG PO TABS: 0.5000 mg | ORAL_TABLET | Freq: Two times a day (BID) | ORAL | 1 refills | Status: DC | PRN

## 2021-11-30 MED ORDER — SUMATRIPTAN SUCCINATE 100 MG PO TABS: ORAL_TABLET | ORAL | 1 refills | Status: DC

## 2021-11-30 MED ORDER — AMPHETAMINE-DEXTROAMPHETAMINE 20 MG PO TABS: 20.0000 mg | ORAL_TABLET | Freq: Two times a day (BID) | ORAL | 0 refills | Status: DC

## 2021-11-30 NOTE — Assessment & Plan Note (Signed)
Improving slowly but stilll not sleeping ?Klonopin po qhs prn  ? ?

## 2021-11-30 NOTE — Assessment & Plan Note (Signed)
Check labs ?Probably from withdrawal symptoms  ?rto if it does not improve next few weeks  ?

## 2021-11-30 NOTE — Progress Notes (Signed)
? ?Subjective:  ? ?By signing my name below, I, [name], attest that this documentation has been prepared under the direction and in the presence of [name], DO. 11/30/2021 ? ? ? Patient ID: Tracy Cook, female    DOB: Aug 29, 1955, 66 y.o.   MRN: [id number] ? ?Chief Complaint  ?Patient presents with  ? Osteoporosis  ?  Pt states wanting to discuss medications for osteoporisis. Pt also states going through withdrawal from stopping Percocet. Pt did taper down and last dose was Sunday. Pt states being fatigued and weight loss.   ? ? ?HPI ?Patient is in today for a office visit. She is present with her husband during this visit.  ? ?She reports going through percocet withdrawal. She started percocet's in November, 2022 due to multiple spinal procedures following a compression spinal fracture. She has taken a break from percocet prior but resumed due to pain. She feels like she is getting physically weaker and is experiencing night sweats. She took her last dose on Sunday and reports no sweating while sleeping. She is also slowly regaining her strength but finds she has difficulty sleeping. She continues having back pain and besides percocet's, she was taking ibuprofen but stopped due to developing stomach pain. Her back pain is worse at night.  ?She would like to regain her strength back to her levels before her back procedure. Her husband reports she does not eat enough daily. She is actively trying to increase her diet. She has tried an IV infusion treatment but found she felt worse after completing it. She thinks she may have been feeling too weak to feel the effects at the time.  ?She continues having migraines and reports she stopped seeing her neurologist. She is requesting to resume her prescription of sumatriptan.  ?She is requesting a refill for 20 mg adderall 2x daily PO.  ?She reports having bad reaction to taking fosamax and is willing to take an alternative medication to manage her  pain.  ? ? ?Past Medical History:  ?Diagnosis Date  ? Cardiomyopathy ([place])   ? Diverticulosis   ? Insomnia   ? Menopause   ? Migraine   ? Orthostatic hypotension   ? Osteoporosis   ? Other malaise and fatigue   ? Persistent disorder of initiating or maintaining sleep   ? POTS (postural orthostatic tachycardia syndrome)   ? Tachycardia, unspecified   ? ? ?Past Surgical History:  ?Procedure Laterality Date  ? COLONOSCOPY  2013  ? with Dr. Collene Mares  ? FINGER SURGERY Left   ? index-fusion  ? IR KYPHO LUMBAR INC FX REDUCE BONE BX UNI/BIL CANNULATION INC/IMAGING  11/11/2019  ? IR KYPHO LUMBAR INC FX REDUCE BONE BX UNI/BIL CANNULATION INC/IMAGING  12/17/2019  ? IR RADIOLOGIST EVAL & MGMT  11/05/2019  ? IR RADIOLOGIST EVAL & MGMT  12/09/2019  ? No prior surgery    ? [place]  ? ? ?Family History  ?Problem Relation Age of Onset  ? Coronary artery disease Father   ?     Died of MI at age 55  ? Asthma Father   ? Hypertension Father   ? Sudden death Father   ? Heart attack Father   ? Stroke Mother   ? Osteoporosis Mother   ? Colon polyps Mother 69  ? Osteoporosis Sister   ? Diabetes Neg Hx   ? Hyperlipidemia Neg Hx   ? Colon cancer Neg Hx   ? Esophageal cancer Neg  Hx   ? Stomach cancer Neg Hx   ? Rectal cancer Neg Hx   ? ? ?Social History  ? ?Socioeconomic History  ? Marital status: Married  ?  Spouse name: Not on file  ? Number of children: 2  ? Years of education: Not on file  ? Highest education level: Not on file  ?Occupational History  ?  Comment: Medial lab technologist  ?Tobacco Use  ? Smoking status: Never  ? Smokeless tobacco: Never  ?Vaping Use  ? Vaping Use: Never used  ?Substance and Sexual Activity  ? Alcohol use: No  ? Drug use: No  ? Sexual activity: Yes  ?  Partners: Male  ?Other Topics Concern  ? Not on file  ?Social History Narrative  ? Exercise almost every day  ? ?Social Determinants of Health  ? ?Financial Resource Strain: Not on file  ?Food Insecurity: Not on file  ?Transportation Needs: Not  on file  ?Physical Activity: Not on file  ?Stress: Not on file  ?Social Connections: Not on file  ?Intimate Partner Violence: Not on file  ? ? ?Outpatient Medications Prior to Visit  ?Medication Sig Dispense Refill  ? calcitonin, salmon, (MIACALCIN/FORTICAL) 200 UNIT/ACT nasal spray Place 1 spray into alternate nostrils daily. 3.7 mL 12  ? propranolol (INDERAL) 20 MG tablet Take 1 tablet (20 mg total) by mouth 3 (three) times daily. 270 tablet 3  ? tiZANidine (ZANAFLEX) 4 MG tablet Take 4 mg by mouth every 8 (eight) hours as needed for muscle spasms.    ? amphetamine-dextroamphetamine (ADDERALL) 20 MG tablet Take 1 tablet (20 mg total) by mouth 2 (two) times daily. 60 tablet 0  ? SUMAtriptan (IMITREX) 100 MG tablet SEE NOTES 12 tablet 1  ? benzonatate (TESSALON) 200 MG capsule Take 1 capsule (200 mg total) by mouth 2 (two) times daily as needed for cough. (Patient not taking: Reported on 11/30/2021) 20 capsule 0  ? escitalopram (LEXAPRO) 10 MG tablet Take 1 tablet (10 mg total) by mouth daily. (Patient not taking: Reported on 11/30/2021) 90 tablet 3  ? promethazine-dextromethorphan (PROMETHAZINE-DM) 6.25-15 MG/5ML syrup Take 5 mLs by mouth 4 (four) times daily as needed for cough. (Patient not taking: Reported on 11/30/2021) 118 mL 0  ? ?No facility-administered medications prior to visit.  ? ? ?Allergies  ?Allergen Reactions  ? Alendronate Sodium Rash  ? Codeine Nausea And Vomiting  ? Hydrocodone-Acetaminophen Nausea And Vomiting  ?  Can take with zofran   ? Parathyroid Hormone ([place]) Other (See Comments)  ?  Bone pain and headaches ?Bone pain and headaches  ? Vicodin [Hydrocodone-Acetaminophen] Nausea And Vomiting  ? ? ?Review of Systems  ?Musculoskeletal:  Positive for back pain.  ?Psychiatric/Behavioral:    ?     (+)percocet withdrawal  ? ?   ?Objective:  ?  ?Physical Exam ?Constitutional:   ?   General: She is not in acute distress. ?   Appearance: Normal appearance. She is not ill-appearing.  ?HENT:  ?   Head:  Normocephalic and atraumatic.  ?   Right Ear: External ear normal.  ?   Left Ear: External ear normal.  ?Eyes:  ?   Extraocular Movements: Extraocular movements intact.  ?   Pupils: Pupils are equal, round, and reactive to light.  ?Cardiovascular:  ?   Rate and Rhythm: Normal rate and regular rhythm.  ?   Heart sounds: Normal heart sounds. No murmur heard. ?  No gallop.  ?Pulmonary:  ?   Effort: Pulmonary  effort is normal. No respiratory distress.  ?   Breath sounds: Normal breath sounds. No wheezing or rales.  ?Skin: ?   General: Skin is warm and dry.  ?Neurological:  ?   Mental Status: She is alert and oriented to person, place, and time.  ?Psychiatric:     ?   Judgment: Judgment normal.  ? ? ?BP 112/80 (BP Location: Right Arm, Patient Position: Sitting, Cuff Size: Normal)   Pulse 98   Temp 97.7 ?F (36.5 ?C) (Oral)   Resp 18   Ht '5\' 9"'$  (1.753 m)   Wt 117 lb 12.8 oz (53.4 kg)   SpO2 97%   BMI 17.40 kg/m?  ?Wt Readings from Last 3 Encounters:  ?11/30/21 117 lb 12.8 oz (53.4 kg)  ?06/05/21 121 lb 6.4 oz (55.1 kg)  ?04/28/21 122 lb 6.4 oz (55.5 kg)  ? ? ?Diabetic Foot Exam - Simple   ?No data filed ?  ? ?Lab Results  ?Component Value Date  ? WBC 5.6 02/05/2020  ? HGB 12.7 02/05/2020  ? HCT 38.7 02/05/2020  ? PLT 239.0 02/05/2020  ? GLUCOSE 100 (H) 04/28/2021  ? CHOL 149 02/05/2020  ? TRIG 98.0 02/05/2020  ? HDL 63.60 02/05/2020  ? [place] 66 02/05/2020  ? ALT 24 02/07/2017  ? AST 22 02/07/2017  ? NA 138 04/28/2021  ? K 4.2 04/28/2021  ? CL 98 04/28/2021  ? CREATININE 0.85 04/28/2021  ? BUN 20 04/28/2021  ? CO2 35 (H) 04/28/2021  ? TSH 0.41 04/28/2021  ? INR 1.0 12/17/2019  ? HGBA1C 5.8 02/07/2017  ? MICROALBUR <0.7 11/13/2016  ? ? ?Lab Results  ?Component Value Date  ? TSH 0.41 04/28/2021  ? ?Lab Results  ?Component Value Date  ? WBC 5.6 02/05/2020  ? HGB 12.7 02/05/2020  ? HCT 38.7 02/05/2020  ? MCV 92.2 02/05/2020  ? PLT 239.0 02/05/2020  ? ?Lab Results  ?Component Value Date  ? NA 138 04/28/2021  ? K 4.2  04/28/2021  ? CO2 35 (H) 04/28/2021  ? GLUCOSE 100 (H) 04/28/2021  ? BUN 20 04/28/2021  ? CREATININE 0.85 04/28/2021  ? BILITOT 0.5 02/07/2017  ? ALKPHOS 53 02/07/2017  ? AST 22 02/07/2017  ? ALT 24 02/07/2017  ?

## 2021-11-30 NOTE — Patient Instructions (Signed)
Opioid Withdrawal Treatment ?Opioid withdrawal is a group of symptoms that can happen if you have been taking opioids and then stop taking them. Opioid withdrawal can also happen: ?When the dosage is decreased. ?After stopping a prescription opioid as directed. Physical dependence can develop after taking opioids regularly for just a few days. ?After taking a prescription opioid incorrectly, such as taking more than recommended or taking it for a different purpose (opioid misuse). ?Taking the opioid illegally and then stopping. ?Opioid withdrawal is not usually life-threatening, but it can be very uncomfortable and severe. Opioid withdrawal treatment makes managing withdrawal easier. ?What are the signs and symptoms of opioid withdrawal? ?Symptoms of this condition can be both physical and mental. Mild physical symptoms include: ?Nausea. ?Muscle aches or spasms. ?Watery eyes and runny nose. ?Widening of the dark centers of the eyes (dilated pupils). ?Flushing and itching of the skin. ?Moderate symptoms include: ?Fever and sweating. ?Stomach cramping and diarrhea. ?Vomiting. ?Increased blood pressure and fast pulse. ?Mental symptoms include: ?Depression. ?Anxiety and nervousness. ?Restlessness, irritability, and severe mood swings. ?Trouble sleeping. ?Increased craving for drugs. ?When do symptoms start and how long do they last? ?When symptoms start and how long they last depend on the kind of opioid you have been taking. ?Short-acting opioids, such as heroin or oxycodone, work fast and then lose their effect quickly. Symptoms occur within hours of stopping or reducing the amount you take. The worst symptoms (peak withdrawal) occur in 2-3 days. Overall, symptoms should lessen in 7-10 days. ?Long-acting opioids, such as methadone, work for a longer period of time. Symptoms can occur within 1-3 days of stopping or reducing the amount you take. The worst symptoms occur in 3-8 days. Symptoms may continue for several  weeks but will be milder. ?Medicines that block the effects of opioids, such as naloxone, can cause withdrawal symptoms that begin within minutes and last for about an hour. It is important to get medical help in this situation. ?How is this treated? ?Treatment for this condition depends on the type of opioids that are causing your symptoms. Treatment for opioid withdrawal usually happens: ?In an emergency department. ?In a clinic or drug treatment facility. ?At home. ?In a combination of different settings. ?Medicines ?Opioid or opioid-like medicine. This is the most effective treatment. It is used to block withdrawal symptoms, and then the dose is lowered over time. ?If you have been taking prescription opioid medicine, your health care provider may lower the dose over several days. ?If you have opioid use disorder, your health care provider may give you the following medicines to block withdrawals: ?Methadone. It is given at a certain dose. The dose is then slowly lowered over 6-10 days. ?Buprenorphine. It is given at a certain dose. The dose is then lowered over 3-5 days. In some cases, it may be lowered very slowly over 30 days. ?Other medicines may also be used as a stand-alone treatment or in combination with other treatments. These include: ?Alpha-2 adrenergic agonists. These medicines block a brain chemical (norepinephrine) that causes some withdrawal symptoms. They can reduce and lessen the severity of symptoms but do not stop them. The medicines can cause side effects such as drowsiness, dizziness, and low blood pressure. ?Medicines to reduce anxiety, relieve muscle aches, promote sleep, and decrease digestive symptoms like nausea and diarrhea. ?In an emergency, medicines that block the effects of opioids may be used. These medicines are called opioid antagonists. Naloxone is an example of this medicine. Naloxone is given to restore a  person's breathing if it has slowed down or stopped due to an opioid  overdose. ?Other treatments ? ?Counseling. This treatment is also called talk therapy. It is provided by treatment counselors and is used in addition to medicines. ?Support groups. These groups provide emotional support, advice, and guidance during recovery. They offer a safe environment in which you can talk to present and past opioid users and those who have gone through treatment. ?Follow these instructions at home: ?Always check with your health care provider before starting any new medicines. Many of the medicines used to treat opioid withdrawal can have dangerous side effects and should only be taken as prescribed by your health care provider. ?Do not start using an opioid medicine again without talking to a health care provider. You may be at an increased risk for problems, including opioid overdose. ?If you have chronic pain, ask your health care provider about an intensive pain rehabilitation program or a referral to a chronic pain specialist. You may need to work with a pain specialist to come up with a treatment plan to help manage pain. ?Keep all follow-up visits. This is important. ?Contact a health care provider if: ?You need help stopping use of an opioid medicine. ?You have been on long-term opioids and are planning to stop, and you would like to prepare for any withdrawal symptoms. ?You have been treated at home, but you are still having symptoms of withdrawal. ?You have been treated for withdrawal, but you have started using opioids again (relapsed). ?Get help right away if: ?You have chest pain and shortness of breath. ?You are drowsy and have difficulty staying awake. ?You feel weak or dizzy, or feel like you are going to pass out. ?You have nausea or vomiting that does not go away, or you begin to vomit blood. ?You have diarrhea that does not stop, or you begin to feel weak and light-headed. ?You feel severely depressed and anxious. ?You feel you may be a harm to yourself or others. ?These  symptoms may represent a serious problem that is an emergency. Do not wait to see if the symptoms will go away. Get medical help right away. Call your local emergency services (911 in the [place] Do not drive yourself to the hospital.  ?If you ever feel like you may hurt yourself or others, or have thoughts about taking your own life, get help right away. Go to your nearest emergency department or: ?Call your local emergency services (911 in the [place] ?Call a suicide crisis helpline, such as the [place] at (502)264-1974 or 988 in the [place]. This is open 24 hours a day in the [place] ?Text the Crisis Text Line at 618-833-0758 (in the [place].). ?Summary ?Opioid withdrawal is a group of symptoms that can happen if you have been taking opioids and then stop taking them. ?Opioid withdrawal symptoms are treated using medicines, counseling, and support groups. ?The most effective treatment is to use an opioid or opioid-like drug to block withdrawal symptoms and gradually lower the dose over time. ?This information is not intended to replace advice given to you by your health care provider. Make sure you discuss any questions you have with your health care provider. ?Document Revised: 02/01/2021 Document Reviewed: 10/19/2020 ?Elsevier Patient Education ? [place]. ? ?

## 2021-11-30 NOTE — Assessment & Plan Note (Signed)
D/w pt evenity ?She has side effect to fosamax  ?

## 2021-12-01 ENCOUNTER — Telehealth: Payer: Self-pay

## 2021-12-01 LAB — COMPREHENSIVE METABOLIC PANEL
ALT: 21 U/L (ref 0–35)
AST: 24 U/L (ref 0–37)
Albumin: 4.4 g/dL (ref 3.5–5.2)
Alkaline Phosphatase: 79 U/L (ref 39–117)
BUN: 22 mg/dL (ref 6–23)
CO2: 34 mEq/L — ABNORMAL HIGH (ref 19–32)
Calcium: 10.3 mg/dL (ref 8.4–10.5)
Chloride: 99 mEq/L (ref 96–112)
Creatinine, Ser: 1.33 mg/dL — ABNORMAL HIGH (ref 0.40–1.20)
GFR: 42.21 mL/min — ABNORMAL LOW (ref >60.00)
Glucose, Bld: 93 mg/dL (ref 70–99)
Potassium: 4 mEq/L (ref 3.5–5.1)
Sodium: 141 mEq/L (ref 135–145)
Total Bilirubin: 0.5 mg/dL (ref 0.2–1.2)
Total Protein: 6.7 g/dL (ref 6.0–8.3)

## 2021-12-01 LAB — CBC WITH DIFFERENTIAL/PLATELET
Basophils Absolute: 0.1 10*3/uL (ref 0.0–0.1)
Basophils Relative: 0.9 % (ref 0.0–3.0)
Eosinophils Absolute: 0.1 10*3/uL (ref 0.0–0.7)
Eosinophils Relative: 1.1 % (ref 0.0–5.0)
HCT: 44.3 % (ref 36.0–46.0)
Hemoglobin: 14.8 g/dL (ref 12.0–15.0)
Lymphocytes Relative: 17.4 % (ref 12.0–46.0)
Lymphs Abs: 1.7 10*3/uL (ref 0.7–4.0)
MCHC: 33.5 g/dL (ref 30.0–36.0)
MCV: 89.9 fl (ref 78.0–100.0)
Monocytes Absolute: 0.8 10*3/uL (ref 0.1–1.0)
Monocytes Relative: 8.1 % (ref 3.0–12.0)
Neutro Abs: 7.1 10*3/uL (ref 1.4–7.7)
Neutrophils Relative %: 72.5 % (ref 43.0–77.0)
Platelets: 330 10*3/uL (ref 150.0–400.0)
RBC: 4.92 Mil/uL (ref 3.87–5.11)
RDW: 13.2 % (ref 11.5–15.5)
WBC: 9.8 10*3/uL (ref 4.0–10.5)

## 2021-12-01 LAB — VITAMIN B12: Vitamin B-12: 1203 pg/mL — ABNORMAL HIGH (ref 211–911)

## 2021-12-01 LAB — VITAMIN D 25 HYDROXY (VIT D DEFICIENCY, FRACTURES): VITD: 60.78 ng/mL (ref 30.00–100.00)

## 2021-12-01 LAB — TSH: TSH: 0.27 u[IU]/mL — ABNORMAL LOW (ref 0.35–5.50)

## 2021-12-01 NOTE — Telephone Encounter (Signed)
Please check EOB for Evinity.  ?

## 2021-12-01 NOTE — Telephone Encounter (Signed)
-----   Message from [name], DO sent at 11/30/2021  2:00 PM EDT ----- ?Pt is interested in evenity ? ?

## 2021-12-03 LAB — DRUG MONITORING PANEL 376104, URINE
Amphetamine: 610 ng/mL — ABNORMAL HIGH (ref <250)
Amphetamines: POSITIVE ng/mL — AB (ref <500)
Barbiturates: NEGATIVE ng/mL (ref <300)
Benzodiazepines: NEGATIVE ng/mL (ref <100)
Cocaine Metabolite: NEGATIVE ng/mL (ref <150)
Desmethyltramadol: NEGATIVE ng/mL (ref <100)
Methamphetamine: NEGATIVE ng/mL (ref <250)
Opiates: NEGATIVE ng/mL (ref <100)
Oxycodone: NEGATIVE ng/mL (ref <100)
Tramadol: NEGATIVE ng/mL (ref <100)

## 2021-12-03 LAB — DM TEMPLATE

## 2021-12-04 ENCOUNTER — Ambulatory Visit: Payer: BC Managed Care – PPO | Admitting: Family Medicine

## 2021-12-04 ENCOUNTER — Encounter: Payer: Self-pay | Admitting: Family Medicine

## 2021-12-05 MED ORDER — ROPINIROLE HCL 0.25 MG PO TABS: ORAL_TABLET | ORAL | 2 refills | Status: DC

## 2021-12-07 NOTE — Telephone Encounter (Signed)
Evenity VOB initiated via [email or web address]  New start  

## 2021-12-12 DIAGNOSIS — M5136 Other intervertebral disc degeneration, lumbar region: Secondary | ICD-10-CM | POA: Diagnosis not present

## 2021-12-12 DIAGNOSIS — M546 Pain in thoracic spine: Secondary | ICD-10-CM | POA: Diagnosis not present

## 2021-12-12 DIAGNOSIS — M4854XD Collapsed vertebra, not elsewhere classified, thoracic region, subsequent encounter for fracture with routine healing: Secondary | ICD-10-CM | POA: Diagnosis not present

## 2021-12-14 NOTE — Telephone Encounter (Signed)
Prior auth required for EVENITY  PA PROCESS DETAILS: PA is required. Providers may call Medical Utilization at [phone] to initiate. Forms may be accessed online at [email or web address]

## 2021-12-27 NOTE — Telephone Encounter (Signed)
Prior Authorization initiated for EVENITY via [email or web address] KEY: [id number]

## 2021-12-31 ENCOUNTER — Encounter: Payer: Self-pay | Admitting: Family Medicine

## 2022-01-02 NOTE — Telephone Encounter (Signed)
Pt would like to check on the progress of Evinity.

## 2022-01-04 ENCOUNTER — Other Ambulatory Visit: Payer: Self-pay | Admitting: Family Medicine

## 2022-01-04 DIAGNOSIS — F988 Other specified behavioral and emotional disorders with onset usually occurring in childhood and adolescence: Secondary | ICD-10-CM

## 2022-01-04 MED ORDER — AMPHETAMINE-DEXTROAMPHETAMINE 20 MG PO TABS: 20.0000 mg | ORAL_TABLET | Freq: Two times a day (BID) | ORAL | 0 refills | Status: DC

## 2022-01-04 NOTE — Telephone Encounter (Signed)
Requesting: Adderall '20mg'$   Contract: 11/30/21  UDS: 11/30/21 Last Visit: 11/30/21 Next Visit: None Last Refill: 11/30/21 #60 and 0RF  Please Advise

## 2022-01-08 ENCOUNTER — Encounter: Payer: Self-pay | Admitting: Family Medicine

## 2022-01-08 DIAGNOSIS — G2581 Restless legs syndrome: Secondary | ICD-10-CM

## 2022-01-10 NOTE — Telephone Encounter (Addendum)
Pt ready for scheduling on or after 12/27/21  Out-of-pocket cost due at time of visit: $0  Primary: BCBS  EVENITY co-insurance: 0% Admin fee co-insurance: 0%  Secondary: n/a EVENITY co-insurance:  Admin fee co-insurance:   Deductible: does not apply  Prior Auth: APPROVED [id number] / KeyDionisio David Valid: 12/27/21-12/26/22    ** This summary of benefits is an estimation of the patient's out-of-pocket cost. Exact cost may very based on individual plan coverage.

## 2022-01-10 NOTE — Telephone Encounter (Signed)
Pt scheduled  

## 2022-01-10 NOTE — Telephone Encounter (Signed)
CV#[id number] / KeyDionisio David Valid: 12/27/21-12/26/22

## 2022-01-16 ENCOUNTER — Ambulatory Visit (INDEPENDENT_AMBULATORY_CARE_PROVIDER_SITE_OTHER): Payer: BC Managed Care – PPO

## 2022-01-16 DIAGNOSIS — M810 Age-related osteoporosis without current pathological fracture: Secondary | ICD-10-CM

## 2022-01-16 MED ORDER — ROMOSOZUMAB-AQQG 105 MG/1.17ML ~~LOC~~ SOSY
210.0000 mg | PREFILLED_SYRINGE | Freq: Once | SUBCUTANEOUS | Status: AC
  Administered 2022-01-16: 210 mg via SUBCUTANEOUS

## 2022-01-16 NOTE — Progress Notes (Signed)
Tracy Cook is a 66 y.o. female presents to the office today for Evinity Injection per physician's orders. Original order: 12/01/21- Dr Etter Sjogren Evinity 210 mg SQ  was administered L arm today. Patient tolerated injection. Patient due for follow up labs/provider appt: No.  Patient next injection due: 1 month, appt made Yes  [place], Kristine Garbe L

## 2022-01-18 ENCOUNTER — Other Ambulatory Visit: Payer: Self-pay

## 2022-01-18 MED ORDER — PRAMIPEXOLE DIHYDROCHLORIDE 0.125 MG PO TABS: 0.1250 mg | ORAL_TABLET | Freq: Three times a day (TID) | ORAL | 2 refills | Status: DC

## 2022-01-19 NOTE — Telephone Encounter (Signed)
Evenity Injection Schedule Inj #1 - 01/16/22 Inj #2 - scheduled 02/15/22 Inj #3  Inj #4  Inj #5  Inj #6  Inj #7  Inj #8  Inj #9  Inj #10  Inj #11  Inj #12

## 2022-02-01 ENCOUNTER — Other Ambulatory Visit: Payer: Self-pay | Admitting: Family Medicine

## 2022-02-01 ENCOUNTER — Encounter: Payer: Self-pay | Admitting: Family Medicine

## 2022-02-01 DIAGNOSIS — F988 Other specified behavioral and emotional disorders with onset usually occurring in childhood and adolescence: Secondary | ICD-10-CM

## 2022-02-01 MED ORDER — AMPHETAMINE-DEXTROAMPHETAMINE 20 MG PO TABS: 20.0000 mg | ORAL_TABLET | Freq: Two times a day (BID) | ORAL | 0 refills | Status: DC

## 2022-02-01 NOTE — Telephone Encounter (Signed)
Requesting:adderall 20 mg Contract:11/30/21 UDS:11/30/21 Last Visit:11/30/21 Next Visit:unknown Last Refill:01/04/22  Please Advise

## 2022-02-01 NOTE — Telephone Encounter (Signed)
Noted  

## 2022-02-02 NOTE — Telephone Encounter (Signed)
Shanon Brow  P [facility] Clinical Pool (supporting [name], DO) Yesterday (9:13 AM)    I have decided to not get another Evenity shot. I have had a lot of side effects including heart arrhythmia, blurred vision, and difficulty speaking. I have canceled the appointment for the shot.

## 2022-02-02 NOTE — Telephone Encounter (Signed)
Archived in [facility] portal.

## 2022-02-06 ENCOUNTER — Ambulatory Visit: Payer: BC Managed Care – PPO | Admitting: Family Medicine

## 2022-02-15 ENCOUNTER — Ambulatory Visit: Payer: BC Managed Care – PPO

## 2022-02-22 NOTE — Telephone Encounter (Signed)
Pt archived in [email or web address].  Please advise if patient and/or provider wish to proceed with Evenity therapy.

## 2022-03-04 ENCOUNTER — Other Ambulatory Visit: Payer: Self-pay | Admitting: Family Medicine

## 2022-03-04 DIAGNOSIS — F988 Other specified behavioral and emotional disorders with onset usually occurring in childhood and adolescence: Secondary | ICD-10-CM

## 2022-03-05 MED ORDER — AMPHETAMINE-DEXTROAMPHETAMINE 20 MG PO TABS: 20.0000 mg | ORAL_TABLET | Freq: Two times a day (BID) | ORAL | 0 refills | Status: DC

## 2022-03-05 NOTE — Telephone Encounter (Signed)
Requesting: Adderall '20mg'$  Contract:   UDS: 11/30/21 Last Visit: 11/30/21 Next Visit: none Last Refill: 7/13./23  Please Advise

## 2022-04-02 ENCOUNTER — Encounter: Payer: Self-pay | Admitting: Cardiology

## 2022-04-03 MED ORDER — PROPRANOLOL HCL 20 MG PO TABS: 20.0000 mg | ORAL_TABLET | Freq: Three times a day (TID) | ORAL | 1 refills | Status: DC

## 2022-04-04 ENCOUNTER — Other Ambulatory Visit: Payer: Self-pay | Admitting: Family Medicine

## 2022-04-04 DIAGNOSIS — F988 Other specified behavioral and emotional disorders with onset usually occurring in childhood and adolescence: Secondary | ICD-10-CM

## 2022-04-04 MED ORDER — AMPHETAMINE-DEXTROAMPHETAMINE 20 MG PO TABS: 20.0000 mg | ORAL_TABLET | Freq: Two times a day (BID) | ORAL | 0 refills | Status: DC

## 2022-04-04 NOTE — Telephone Encounter (Signed)
Requesting: Adderall '20mg'$   Contract:11/30/21 UDS:11/30/21 Last Visit: 11/30/21 Next Visit: None  Last Refill: 03/05/22 #60 and 0RF  Please Advise

## 2022-04-30 ENCOUNTER — Ambulatory Visit: Payer: BC Managed Care – PPO | Admitting: Endocrinology

## 2022-05-04 ENCOUNTER — Other Ambulatory Visit: Payer: Self-pay | Admitting: Family Medicine

## 2022-05-04 DIAGNOSIS — F988 Other specified behavioral and emotional disorders with onset usually occurring in childhood and adolescence: Secondary | ICD-10-CM

## 2022-05-07 MED ORDER — AMPHETAMINE-DEXTROAMPHETAMINE 20 MG PO TABS: 20.0000 mg | ORAL_TABLET | Freq: Two times a day (BID) | ORAL | 0 refills | Status: DC

## 2022-05-07 NOTE — Telephone Encounter (Signed)
Requesting: Adderall '20mg'$  Contract: 11/30/21 UDS: 11/30/21  Last Visit: 11/30/21 Next Visit: none Last Refill: 04/04/22  Please Advise

## 2022-05-30 NOTE — Progress Notes (Signed)
HPI: FU CAD, CM and POTS. Echocardiogram March 2021 showed normal LV function, grade 1 diastolic dysfunction. Cardiac CTA March 2021 showed calcium score 11 and moderate (50 to 69%) mixed plaque stenosis in the proximal LAD. FFR in the LAD 0.83 not suggestive of significant stenosis. Since last seen in June she had an episode of palpitations for several days described as her heart racing.  There was associated dizziness.  She did not have syncope.  She otherwise denies dyspnea on exertion, orthopnea, PND, pedal edema, exertional chest pain or syncope.  Current Outpatient Medications  Medication Sig Dispense Refill   amphetamine-dextroamphetamine (ADDERALL) 20 MG tablet Take 1 tablet (20 mg total) by mouth 2 (two) times daily. 60 tablet 0   amphetamine-dextroamphetamine (ADDERALL) 20 MG tablet Take 1 tablet (20 mg total) by mouth 2 (two) times daily. 60 tablet 0   amphetamine-dextroamphetamine (ADDERALL) 20 MG tablet Take 1 tablet (20 mg total) by mouth 2 (two) times daily. 60 tablet 0   calcitonin, salmon, (MIACALCIN/FORTICAL) 200 UNIT/ACT nasal spray Place 1 spray into alternate nostrils daily. 3.7 mL 12   NEURONTIN 300 MG capsule Take 1 capsule at bedtime for 3 days, then 1 capsule twice a day for 3 days, then 1 capsule 3 times a day     propranolol (INDERAL) 20 MG tablet Take 1 tablet (20 mg total) by mouth 3 (three) times daily. 90 tablet 1   SUMAtriptan (IMITREX) 100 MG tablet SEE NOTES 12 tablet 1   tiZANidine (ZANAFLEX) 4 MG tablet Take 4 mg by mouth every 8 (eight) hours as needed for muscle spasms.     No current facility-administered medications for this visit.     Past Medical History:  Diagnosis Date   Cardiomyopathy ([place])    Diverticulosis    Insomnia    Menopause    Migraine    Orthostatic hypotension    Osteoporosis    Other malaise and fatigue    Persistent disorder of initiating or maintaining sleep    POTS (postural orthostatic tachycardia syndrome)     Tachycardia, unspecified     Past Surgical History:  Procedure Laterality Date   COLONOSCOPY  2013   with Dr. Collene Mares   FINGER SURGERY Left    index-fusion   IR KYPHO LUMBAR INC FX REDUCE BONE BX UNI/BIL CANNULATION INC/IMAGING  11/11/2019   IR KYPHO LUMBAR INC FX REDUCE BONE BX UNI/BIL CANNULATION INC/IMAGING  12/17/2019   IR RADIOLOGIST EVAL & MGMT  11/05/2019   IR RADIOLOGIST EVAL & MGMT  12/09/2019   No prior surgery     WISDOM TOOTH EXTRACTION  1974    Social History   Socioeconomic History   Marital status: Married    Spouse name: Not on file   Number of children: 2   Years of education: Not on file   Highest education level: Not on file  Occupational History    Comment: Medial lab technologist  Tobacco Use   Smoking status: Never   Smokeless tobacco: Never  Vaping Use   Vaping Use: Never used  Substance and Sexual Activity   Alcohol use: No   Drug use: No   Sexual activity: Yes    Partners: Male  Other Topics Concern   Not on file  Social History Narrative   Exercise almost every day   Social Determinants of Health   Financial Resource Strain: Not on file  Food Insecurity: Not on file  Transportation Needs: Not on file  Physical Activity: Not  on file  Stress: Not on file  Social Connections: Not on file  Intimate Partner Violence: Not on file    Family History  Problem Relation Age of Onset   Coronary artery disease Father        Died of MI at age 70   Asthma Father    Hypertension Father    Sudden death Father    Heart attack Father    Stroke Mother    Osteoporosis Mother    Colon polyps Mother 56   Osteoporosis Sister    Diabetes Neg Hx    Hyperlipidemia Neg Hx    Colon cancer Neg Hx    Esophageal cancer Neg Hx    Stomach cancer Neg Hx    Rectal cancer Neg Hx     ROS: no fevers or chills, productive cough, hemoptysis, dysphasia, odynophagia, melena, hematochezia, dysuria, hematuria, rash, seizure activity, orthopnea, PND, pedal edema,  claudication. Remaining systems are negative.  Physical Exam: Well-developed well-nourished in no acute distress.  Skin is warm and dry.  HEENT is normal.  Neck is supple.  Chest is clear to auscultation with normal expansion.  Cardiovascular exam is regular rate and rhythm.  Abdominal exam nontender or distended. No masses palpated. Extremities show no edema. neuro grossly intact  ECG-sinus tachycardia at a rate of 104, no ST changes.  Personally reviewed  A/P  1 history of cardiomyopathy-LV function normal on most recent echo; continue beta blocker.   2 history of chest pain-no recurrences.  Previous CTA as outlined.   3 coronary artery disease-continue ASA; previously declined statin.   4 POTS-continue beta-blocker at present dose.  Kirk Ruths, MD

## 2022-06-05 ENCOUNTER — Ambulatory Visit (INDEPENDENT_AMBULATORY_CARE_PROVIDER_SITE_OTHER): Payer: Medicare Other | Admitting: Family Medicine

## 2022-06-05 ENCOUNTER — Encounter: Payer: Self-pay | Admitting: Family Medicine

## 2022-06-05 DIAGNOSIS — G43809 Other migraine, not intractable, without status migrainosus: Secondary | ICD-10-CM

## 2022-06-05 DIAGNOSIS — F988 Other specified behavioral and emotional disorders with onset usually occurring in childhood and adolescence: Secondary | ICD-10-CM

## 2022-06-05 MED ORDER — AMPHETAMINE-DEXTROAMPHETAMINE 20 MG PO TABS: 20.0000 mg | ORAL_TABLET | Freq: Two times a day (BID) | ORAL | 0 refills | Status: DC

## 2022-06-05 MED ORDER — SUMATRIPTAN SUCCINATE 100 MG PO TABS: ORAL_TABLET | ORAL | 1 refills | Status: DC

## 2022-06-05 NOTE — Assessment & Plan Note (Signed)
Uds/ contract  Database reviewed  Refill med F/u 6 months or sooner prn

## 2022-06-05 NOTE — Patient Instructions (Signed)
Living With Attention Deficit Hyperactivity Disorder If you have been diagnosed with attention deficit hyperactivity disorder (ADHD), you may be relieved that you now know why you have felt or behaved a certain way. Still, you may feel overwhelmed about the treatment ahead. You may also wonder how to get the support you need and how to deal with the condition day-to-day. With treatment and support, you can live with ADHD and manage your symptoms. How to manage lifestyle changes Managing lifestyle changes can be challenging. Seeking support from your healthcare provider, therapist, family, and friends can be helpful. How to recognize changes in your condition The following signs may mean that your treatment is working well and your condition is improving: Consistently being on time for appointments. Being more organized at home and work. Other people noticing improvements in your behavior. Achieving goals that you set for yourself. Thinking more clearly. The following signs may mean that your treatment is not working very well: Feeling impatience or more confusion. Missing, forgetting, or being late for appointments. An increasing sense of disorganization and messiness. More difficulty in reaching goals that you set for yourself. Loved ones becoming angry or frustrated with you. Follow these instructions at home: Medicines Take over-the-counter and prescription medicines only as told by your health care provider. Check with your health care provider before taking any new medicines. General instructions Create structure and an organized atmosphere at home. For example: Make a list of tasks, then rank them from most important to least important. Work on one task at a time until your listed tasks are done. Make a daily schedule and follow it consistently every day. Use an appointment calendar, and check it 2-3 times a day to keep on track. Keep it with you when you leave the house. Create  spaces where you keep certain things, and always put things back in their places after you use them. Keep all follow-up visits. Your health care provider will need to monitor your condition and adjust your treatment over time. Where to find support Talking to others  Keep emotion out of important discussions and speak in a calm, logical way. Listen closely and patiently to your loved ones. Try to understand their point of view, and try to avoid getting defensive. Take responsibility for the consequences of your actions. Ask that others do not take your behaviors personally. Aim to solve problems as they come up, and express your feelings instead of bottling them up. Talk openly about what you need from your loved ones and how they can support you. Consider going to family therapy sessions or having your family meet with a specialist who deals with ADHD-related behavior problems. Finances Not all insurance plans cover mental health care, so it is important to check with your insurance carrier. If paying for co-pays or counseling services is a problem, search for a local or county mental health care center. Public mental health care services may be offered there at a low cost or no cost when you are not able to see a private health care provider. If you are taking medicine for ADHD, you may be able to get the generic form, which may be less expensive than brand-name medicine. Some makers of prescription medicines also offer help to patients who cannot afford the medicines that they need. Therapy and support groups Talking with a mental health care provider and participating in support groups can help to improve your quality of life, daily functioning, and overall symptoms. Questions to ask your health   care provider: What are the risks and benefits of taking medicines? Would I benefit from therapy? How often should I follow up with a health care provider? Where to find more information Learn more  about ADHD from: Children and Adults with Attention Deficit Hyperactivity Disorder: chadd.org National Institute of Mental Health: nimh.nih.gov Centers for Disease Control and Prevention: cdc.gov Contact a health care provider if: You have side effects from your medicines, such as: Repeated muscle twitches, coughing, or speech outbursts. Sleep problems. Loss of appetite. Dizziness. Unusually fast heartbeat. Stomach pains. Headaches. You have new or worsening behavior problems. You are struggling with anxiety, depression, or substance abuse. Get help right away if: You have a severe reaction to a medicine. These symptoms may be an emergency. Get help right away. Call 911. Do not wait to see if the symptoms will go away. Do not drive yourself to the hospital. Take one of these steps if you feel like you may hurt yourself or others, or have thoughts about taking your own life: Go to your nearest emergency room. Call 911. Call the National Suicide Prevention Lifeline at 1-800-273-8255 or 988. This is open 24 hours a day. Text the Crisis Text Line at 741741. Summary With treatment and support, you can live with ADHD and manage your symptoms. Consider taking part in family therapy or self-help groups with family members or friends. When you talk with friends and family about your ADHD, be patient and communicate openly. Keep all follow-up visits. Your health care provider will need to monitor your condition and adjust your treatment over time. This information is not intended to replace advice given to you by your health care provider. Make sure you discuss any questions you have with your health care provider. Document Revised: 10/27/2021 Document Reviewed: 10/27/2021 Elsevier Patient Education  2023 Elsevier Inc.  

## 2022-06-05 NOTE — Assessment & Plan Note (Signed)
Stable  Refill Imitrex

## 2022-06-05 NOTE — Progress Notes (Signed)
Subjective:   By signing my name below, I, [name], attest that this documentation has been prepared under the direction and in the presence of [name], DO. 06/05/2022     Patient ID: Tracy Cook, female    DOB: 08-28-1955, 66 y.o.   MRN: [id number]  Chief Complaint  Patient presents with   ADD   Follow-up    HPI Patient is in today for a follow up visit.   She complains of nasal congestion. She is taken delsym and afrin nasal spray to manage her symptoms.  She is requesting a refill for 20 mg Adderall and 100 mg Imitrex.  She continues having hearing loss. She has worn hearing aides in the past. She is planning on scheduling an appointment with the audiologist.  She is not interested in receiving the flu vaccine during this visit. She is not interested in receiving the pneumonia vaccine.    Past Medical History:  Diagnosis Date   Cardiomyopathy ([place])    Diverticulosis    Insomnia    Menopause    Migraine    Orthostatic hypotension    Osteoporosis    Other malaise and fatigue    Persistent disorder of initiating or maintaining sleep    POTS (postural orthostatic tachycardia syndrome)    Tachycardia, unspecified     Past Surgical History:  Procedure Laterality Date   COLONOSCOPY  2013   with Dr. Collene Mares   FINGER SURGERY Left    index-fusion   IR KYPHO LUMBAR INC FX REDUCE BONE BX UNI/BIL CANNULATION INC/IMAGING  11/11/2019   IR KYPHO LUMBAR INC FX REDUCE BONE BX UNI/BIL CANNULATION INC/IMAGING  12/17/2019   IR RADIOLOGIST EVAL & MGMT  11/05/2019   IR RADIOLOGIST EVAL & MGMT  12/09/2019   No prior surgery     WISDOM TOOTH EXTRACTION  1974    Family History  Problem Relation Age of Onset   Coronary artery disease Father        Died of MI at age 4   Asthma Father    Hypertension Father    Sudden death Father    Heart attack Father    Stroke Mother    Osteoporosis Mother    Colon polyps Mother 66   Osteoporosis Sister    Diabetes Neg Hx     Hyperlipidemia Neg Hx    Colon cancer Neg Hx    Esophageal cancer Neg Hx    Stomach cancer Neg Hx    Rectal cancer Neg Hx     Social History   Socioeconomic History   Marital status: Married    Spouse name: Not on file   Number of children: 2   Years of education: Not on file   Highest education level: Not on file  Occupational History    Comment: Medial lab technologist  Tobacco Use   Smoking status: Never   Smokeless tobacco: Never  Vaping Use   Vaping Use: Never used  Substance and Sexual Activity   Alcohol use: No   Drug use: No   Sexual activity: Yes    Partners: Male  Other Topics Concern   Not on file  Social History Narrative   Exercise almost every day   Social Determinants of Health   Financial Resource Strain: Not on file  Food Insecurity: Not on file  Transportation Needs: Not on file  Physical Activity: Not on file  Stress: Not on file  Social Connections: Not on file  Intimate Partner Violence:  Not on file    Outpatient Medications Prior to Visit  Medication Sig Dispense Refill   calcitonin, salmon, (MIACALCIN/FORTICAL) 200 UNIT/ACT nasal spray Place 1 spray into alternate nostrils daily. 3.7 mL 12   NEURONTIN 300 MG capsule Take 1 capsule at bedtime for 3 days, then 1 capsule twice a day for 3 days, then 1 capsule 3 times a day     propranolol (INDERAL) 20 MG tablet Take 1 tablet (20 mg total) by mouth 3 (three) times daily. 90 tablet 1   tiZANidine (ZANAFLEX) 4 MG tablet Take 4 mg by mouth every 8 (eight) hours as needed for muscle spasms.     amphetamine-dextroamphetamine (ADDERALL) 20 MG tablet Take 1 tablet (20 mg total) by mouth 2 (two) times daily. 60 tablet 0   SUMAtriptan (IMITREX) 100 MG tablet SEE NOTES 12 tablet 1   clonazePAM (KLONOPIN) 0.5 MG tablet Take 1 tablet (0.5 mg total) by mouth 2 (two) times daily as needed for anxiety. 20 tablet 1   pramipexole (MIRAPEX) 0.125 MG tablet Take 1 tablet (0.125 mg total) by mouth 3 (three) times  daily. 30 tablet 2   No facility-administered medications prior to visit.    Allergies  Allergen Reactions   Alendronate Sodium Rash   Codeine Nausea And Vomiting   Hydrocodone-Acetaminophen Nausea And Vomiting    Can take with zofran    Parathyroid Hormone (Recomb) Other (See Comments)    Bone pain and headaches Bone pain and headaches   Requip [Ropinirole] Nausea And Vomiting   Vicodin [Hydrocodone-Acetaminophen] Nausea And Vomiting    Review of Systems  Constitutional:  Negative for fever.  HENT:  Positive for congestion and hearing loss.   Eyes:  Negative for blurred vision.  Respiratory:  Negative for cough.   Cardiovascular:  Negative for chest pain and palpitations.  Gastrointestinal:  Negative for vomiting.  Musculoskeletal:  Negative for back pain.  Skin:  Negative for rash.  Neurological:  Negative for loss of consciousness and headaches.       Objective:    Physical Exam Vitals and nursing note reviewed.  Constitutional:      General: She is not in acute distress.    Appearance: Normal appearance. She is well-developed. She is not ill-appearing.  HENT:     Head: Normocephalic and atraumatic.     Right Ear: External ear normal.     Left Ear: External ear normal.  Eyes:     Extraocular Movements: Extraocular movements intact.     Conjunctiva/sclera: Conjunctivae normal.     Pupils: Pupils are equal, round, and reactive to light.  Neck:     Thyroid: No thyromegaly.     Vascular: No carotid bruit or JVD.  Cardiovascular:     Rate and Rhythm: Normal rate and regular rhythm.     Heart sounds: Normal heart sounds. No murmur heard.    No gallop.  Pulmonary:     Effort: Pulmonary effort is normal. No respiratory distress.     Breath sounds: Normal breath sounds. No wheezing or rales.  Chest:     Chest wall: No tenderness.  Musculoskeletal:     Cervical back: Normal range of motion and neck supple.  Skin:    General: Skin is warm and dry.  Neurological:      Mental Status: She is alert and oriented to person, place, and time.  Psychiatric:        Judgment: Judgment normal.     BP 110/82 (BP Location: Left Arm, Patient  Position: Sitting, Cuff Size: Normal)   Pulse 82   Temp 98.1 F (36.7 C) (Oral)   Resp 18   Ht '5\' 9"'$  (1.753 m)   Wt 119 lb 6.4 oz (54.2 kg)   SpO2 97%   BMI 17.63 kg/m  Wt Readings from Last 3 Encounters:  06/05/22 119 lb 6.4 oz (54.2 kg)  11/30/21 117 lb 12.8 oz (53.4 kg)  06/05/21 121 lb 6.4 oz (55.1 kg)    Diabetic Foot Exam - Simple   No data filed    Lab Results  Component Value Date   WBC 9.8 11/30/2021   HGB 14.8 11/30/2021   HCT 44.3 11/30/2021   PLT 330.0 11/30/2021   GLUCOSE 93 11/30/2021   CHOL 149 02/05/2020   TRIG 98.0 02/05/2020   HDL 63.60 02/05/2020   LDLCALC 66 02/05/2020   ALT 21 11/30/2021   AST 24 11/30/2021   NA 141 11/30/2021   K 4.0 11/30/2021   CL 99 11/30/2021   CREATININE 1.33 (H) 11/30/2021   BUN 22 11/30/2021   CO2 34 (H) 11/30/2021   TSH 0.27 (L) 11/30/2021   INR 1.0 12/17/2019   HGBA1C 5.8 02/07/2017   MICROALBUR <0.7 11/13/2016    Lab Results  Component Value Date   TSH 0.27 (L) 11/30/2021   Lab Results  Component Value Date   WBC 9.8 11/30/2021   HGB 14.8 11/30/2021   HCT 44.3 11/30/2021   MCV 89.9 11/30/2021   PLT 330.0 11/30/2021   Lab Results  Component Value Date   NA 141 11/30/2021   K 4.0 11/30/2021   CO2 34 (H) 11/30/2021   GLUCOSE 93 11/30/2021   BUN 22 11/30/2021   CREATININE 1.33 (H) 11/30/2021   BILITOT 0.5 11/30/2021   ALKPHOS 79 11/30/2021   AST 24 11/30/2021   ALT 21 11/30/2021   PROT 6.7 11/30/2021   ALBUMIN 4.4 11/30/2021   CALCIUM 10.3 11/30/2021   GFR 42.21 (L) 11/30/2021   Lab Results  Component Value Date   CHOL 149 02/05/2020   Lab Results  Component Value Date   HDL 63.60 02/05/2020   Lab Results  Component Value Date   LDLCALC 66 02/05/2020   Lab Results  Component Value Date   TRIG 98.0 02/05/2020    Lab Results  Component Value Date   CHOLHDL 2 02/05/2020   Lab Results  Component Value Date   HGBA1C 5.8 02/07/2017       Assessment & Plan:   Problem List Items Addressed This Visit       Unprioritized   Migraine    Stable  Refill Imitrex       Relevant Medications   NEURONTIN 300 MG capsule   SUMAtriptan (IMITREX) 100 MG tablet   Attention deficit disorder (ADD) without hyperactivity    Uds/ contract  Database reviewed  Refill med F/u 6 months or sooner prn       Relevant Medications   amphetamine-dextroamphetamine (ADDERALL) 20 MG tablet   amphetamine-dextroamphetamine (ADDERALL) 20 MG tablet   amphetamine-dextroamphetamine (ADDERALL) 20 MG tablet     Meds ordered this encounter  Medications   amphetamine-dextroamphetamine (ADDERALL) 20 MG tablet    Sig: Take 1 tablet (20 mg total) by mouth 2 (two) times daily.    Dispense:  60 tablet    Refill:  0   SUMAtriptan (IMITREX) 100 MG tablet    Sig: SEE NOTES    Dispense:  12 tablet    Refill:  1    TAKE  1 TABLET BY MOUTH AT ONSET OF HEADACHE. MAY REPEAT 1 TIME IN 2 HOURS IF HEADACHE PERSISTS OR RECURS. DO NOT EXCEED 2 TABLETS IN 24 HOURS   amphetamine-dextroamphetamine (ADDERALL) 20 MG tablet    Sig: Take 1 tablet (20 mg total) by mouth 2 (two) times daily.    Dispense:  60 tablet    Refill:  0    Do not fill until dec 2023   amphetamine-dextroamphetamine (ADDERALL) 20 MG tablet    Sig: Take 1 tablet (20 mg total) by mouth 2 (two) times daily.    Dispense:  60 tablet    Refill:  0    Do not refill until Jan 2024    I, [name], DO, personally preformed the services described in this documentation.  All medical record entries made by the scribe were at my direction and in my presence.  I have reviewed the chart and discharge instructions (if applicable) and agree that the record reflects my personal performance and is accurate and complete. 06/05/2022   I,[name],acting as a scribe  for [name], DO.,have documented all relevant documentation on the behalf of [name], DO,as directed by  [name], DO while in the presence of [name], DO.   [name], DO

## 2022-06-13 ENCOUNTER — Ambulatory Visit: Payer: Medicare Other | Attending: Cardiology | Admitting: Cardiology

## 2022-06-13 ENCOUNTER — Encounter: Payer: Self-pay | Admitting: Cardiology

## 2022-06-13 VITALS — BP 114/88 | HR 104 | Ht 69.0 in | Wt 114.0 lb

## 2022-06-13 DIAGNOSIS — I2510 Atherosclerotic heart disease of native coronary artery without angina pectoris: Secondary | ICD-10-CM | POA: Diagnosis present

## 2022-06-13 DIAGNOSIS — I951 Orthostatic hypotension: Secondary | ICD-10-CM | POA: Insufficient documentation

## 2022-06-13 DIAGNOSIS — I420 Dilated cardiomyopathy: Secondary | ICD-10-CM | POA: Insufficient documentation

## 2022-06-13 NOTE — Patient Instructions (Signed)
  Follow-Up: At [facility], you and your health needs are our priority.  As part of our continuing mission to provide you with exceptional heart care, we have created designated Provider Care Teams.  These Care Teams include your primary Cardiologist (physician) and Advanced Practice Providers (APPs -  Physician Assistants and Nurse Practitioners) who all work together to provide you with the care you need, when you need it.  We recommend signing up for the patient portal called "MyChart".  Sign up information is provided on this After Visit Summary.  MyChart is used to connect with patients for Virtual Visits (Telemedicine).  Patients are able to view lab/test results, encounter notes, upcoming appointments, etc.  Non-urgent messages can be sent to your provider as well.   To learn more about what you can do with MyChart, go to [email or web address].    Your next appointment:   12 month(s)  The format for your next appointment:   In Person  Provider:   Brian Crenshaw, MD   

## 2022-06-19 ENCOUNTER — Other Ambulatory Visit: Payer: Self-pay | Admitting: Cardiology

## 2022-07-03 ENCOUNTER — Other Ambulatory Visit: Payer: Self-pay | Admitting: Family Medicine

## 2022-07-03 DIAGNOSIS — F988 Other specified behavioral and emotional disorders with onset usually occurring in childhood and adolescence: Secondary | ICD-10-CM

## 2022-08-02 ENCOUNTER — Other Ambulatory Visit: Payer: Self-pay | Admitting: Family Medicine

## 2022-08-02 DIAGNOSIS — F988 Other specified behavioral and emotional disorders with onset usually occurring in childhood and adolescence: Secondary | ICD-10-CM

## 2022-08-03 MED ORDER — AMPHETAMINE-DEXTROAMPHETAMINE 20 MG PO TABS: 20.0000 mg | ORAL_TABLET | Freq: Two times a day (BID) | ORAL | 0 refills | Status: DC

## 2022-08-03 NOTE — Telephone Encounter (Signed)
Requesting: Adderall Contract: 11/30/2021 UDS: 11/30/2021 Last Visit: 06/05/2022 Next Visit: N/A Last Refill: 06/05/2022  Please Advise

## 2022-08-09 ENCOUNTER — Telehealth: Payer: Self-pay

## 2022-08-09 NOTE — Telephone Encounter (Signed)
PA approved.   Approved. This drug has been approved under the Member's Medicare Part D benefit. Approved quantity: 60 units per 30 day(s). You may fill up to a 90 day supply except for those on Specialty Tier 5, which can be filled up to a 30 day supply. Please call the pharmacy to process the prescription claim.

## 2022-08-09 NOTE — Telephone Encounter (Signed)
PA initiated via Covermymeds; KEY:  [id number]. Awaiting determination.

## 2022-08-27 ENCOUNTER — Other Ambulatory Visit: Payer: Self-pay | Admitting: Cardiology

## 2022-09-03 ENCOUNTER — Other Ambulatory Visit: Payer: Self-pay | Admitting: Family Medicine

## 2022-09-03 DIAGNOSIS — F988 Other specified behavioral and emotional disorders with onset usually occurring in childhood and adolescence: Secondary | ICD-10-CM

## 2022-09-03 MED ORDER — AMPHETAMINE-DEXTROAMPHETAMINE 20 MG PO TABS: 20.0000 mg | ORAL_TABLET | Freq: Two times a day (BID) | ORAL | 0 refills | Status: DC

## 2022-09-03 NOTE — Telephone Encounter (Signed)
Requesting: Adderall  Contract:  11/30/21 UDS: 11/30/21 Last Visit: 06/05/22 Next Visit: None Last Refill: 08/03/22 #60 and 0RF   Please Advise

## 2022-10-03 ENCOUNTER — Other Ambulatory Visit: Payer: Self-pay | Admitting: Family Medicine

## 2022-10-03 DIAGNOSIS — F988 Other specified behavioral and emotional disorders with onset usually occurring in childhood and adolescence: Secondary | ICD-10-CM

## 2022-10-03 MED ORDER — AMPHETAMINE-DEXTROAMPHETAMINE 20 MG PO TABS: 20.0000 mg | ORAL_TABLET | Freq: Two times a day (BID) | ORAL | 0 refills | Status: DC

## 2022-10-03 NOTE — Telephone Encounter (Signed)
Requesting: Adderall '20MG'$  Contract:11/30/21 UDS: 11/30/21 Last Visit: 06/05/22 Next Visit: None Last Refill: 09/03/22 #60 and 0RF   Please Advise

## 2022-11-01 ENCOUNTER — Other Ambulatory Visit: Payer: Self-pay | Admitting: Family Medicine

## 2022-11-01 DIAGNOSIS — F988 Other specified behavioral and emotional disorders with onset usually occurring in childhood and adolescence: Secondary | ICD-10-CM

## 2022-11-01 MED ORDER — AMPHETAMINE-DEXTROAMPHETAMINE 20 MG PO TABS: 20.0000 mg | ORAL_TABLET | Freq: Two times a day (BID) | ORAL | 0 refills | Status: DC

## 2022-11-01 NOTE — Telephone Encounter (Signed)
Requesting: Adderall 20mg   Contract: 11/30/21 UDS: 11/30/21 Last Visit: 06/05/22 Next Visit: None Last Refill: 10/03/22 #60 and 0RF   Please Advise

## 2022-11-22 ENCOUNTER — Encounter: Payer: Self-pay | Admitting: Family Medicine

## 2022-11-22 ENCOUNTER — Ambulatory Visit (INDEPENDENT_AMBULATORY_CARE_PROVIDER_SITE_OTHER): Payer: Medicare Other | Admitting: Family Medicine

## 2022-11-22 VITALS — BP 102/80 | HR 82 | Temp 98.1°F | Resp 18 | Ht 69.0 in | Wt 117.6 lb

## 2022-11-22 DIAGNOSIS — F988 Other specified behavioral and emotional disorders with onset usually occurring in childhood and adolescence: Secondary | ICD-10-CM | POA: Diagnosis not present

## 2022-11-22 DIAGNOSIS — G43809 Other migraine, not intractable, without status migrainosus: Secondary | ICD-10-CM | POA: Diagnosis not present

## 2022-11-22 DIAGNOSIS — Z79899 Other long term (current) drug therapy: Secondary | ICD-10-CM | POA: Diagnosis not present

## 2022-11-22 DIAGNOSIS — F419 Anxiety disorder, unspecified: Secondary | ICD-10-CM

## 2022-11-22 MED ORDER — SUMATRIPTAN SUCCINATE 100 MG PO TABS: ORAL_TABLET | ORAL | 1 refills | Status: DC

## 2022-11-22 MED ORDER — AMPHETAMINE-DEXTROAMPHETAMINE 20 MG PO TABS
20.0000 mg | ORAL_TABLET | Freq: Two times a day (BID) | ORAL | 0 refills | Status: DC
Start: 2022-11-22 — End: 2023-05-07

## 2022-11-22 MED ORDER — AMPHETAMINE-DEXTROAMPHETAMINE 20 MG PO TABS: 20.0000 mg | ORAL_TABLET | Freq: Two times a day (BID) | ORAL | 0 refills | Status: DC

## 2022-11-22 NOTE — Assessment & Plan Note (Signed)
Stable No se with adderall  F/u 6 months

## 2022-11-22 NOTE — Progress Notes (Signed)
Subjective:   By signing my name below, I, Tracy Cook, attest that this documentation has been prepared under the direction and in the presence of [name], DO. 11/22/2022   Patient ID: Tracy Cook, female    DOB: [birth year], 67 y.o.   MRN: [id number]  Chief Complaint  Patient presents with   ADD   Migraine   Follow-up    Migraine  Pertinent negatives include no back pain, blurred vision, coughing, fever or vomiting.   Patient is in today for a follow up visit.   She continues taking 100 mg Imitrex as needed to manage her migraines. She reports no recent migraines. She is requesting a refill on it as well.  She is requesting a refill on 20 mg Adderall for the month of May 2024.    Past Medical History:  Diagnosis Date   Cardiomyopathy (HCC)    Diverticulosis    Insomnia    Menopause    Migraine    Orthostatic hypotension    Osteoporosis    Other malaise and fatigue    Persistent disorder of initiating or maintaining sleep    POTS (postural orthostatic tachycardia syndrome)    Tachycardia, unspecified     Past Surgical History:  Procedure Laterality Date   COLONOSCOPY  2013   with Dr. Loreta Ave   FINGER SURGERY Left    index-fusion   IR KYPHO LUMBAR INC FX REDUCE BONE BX UNI/BIL CANNULATION INC/IMAGING  11/11/2019   IR KYPHO LUMBAR INC FX REDUCE BONE BX UNI/BIL CANNULATION INC/IMAGING  12/17/2019   IR RADIOLOGIST EVAL & MGMT  11/05/2019   IR RADIOLOGIST EVAL & MGMT  12/09/2019   No prior surgery     WISDOM TOOTH EXTRACTION  1974    Family History  Problem Relation Age of Onset   Coronary artery disease Father        Died of MI at age 83   Asthma Father    Hypertension Father    Sudden death Father    Heart attack Father    Stroke Mother    Osteoporosis Mother    Colon polyps Mother 65   Osteoporosis Sister    Diabetes Neg Hx    Hyperlipidemia Neg Hx    Colon cancer Neg Hx    Esophageal cancer Neg Hx    Stomach cancer Neg Hx    Rectal  cancer Neg Hx     Social History   Socioeconomic History   Marital status: Married    Spouse name: Not on file   Number of children: 2   Years of education: Not on file   Highest education level: Bachelor's degree (e.g., BA, AB, BS)  Occupational History    Comment: Medial lab technologist  Tobacco Use   Smoking status: Never   Smokeless tobacco: Never  Vaping Use   Vaping Use: Never used  Substance and Sexual Activity   Alcohol use: No   Drug use: No   Sexual activity: Yes    Partners: Male  Other Topics Concern   Not on file  Social History Narrative   Exercise almost every day   Social Determinants of Health   Financial Resource Strain: Low Risk  (11/15/2022)   Overall Financial Resource Strain (CARDIA)    Difficulty of Paying Living Expenses: Not hard at all  Food Insecurity: No Food Insecurity (11/15/2022)   Hunger Vital Sign    Worried About Running Out of Food in the Last Year: Never true  Ran Out of Food in the Last Year: Never true  Transportation Needs: No Transportation Needs (11/15/2022)   PRAPARE - Administrator, Civil Service (Medical): No    Lack of Transportation (Non-Medical): No  Physical Activity: Sufficiently Active (11/15/2022)   Exercise Vital Sign    Days of Exercise per Week: 5 days    Minutes of Exercise per Session: 30 min  Stress: No Stress Concern Present (11/15/2022)   [employer] of Occupational Health - Occupational Stress Questionnaire    Feeling of Stress : Not at all  Social Connections: Socially Integrated (11/15/2022)   Social Connection and Isolation Panel [NHANES]    Frequency of Communication with Friends and Family: More than three times a week    Frequency of Social Gatherings with Friends and Family: Once a week    Attends Religious Services: More than 4 times per year    Active Member of [employer] or Organizations: Yes    Attends Engineer, structural: More than 4 times per year    Marital Status:  Married  Catering manager Violence: Not on file    Outpatient Medications Prior to Visit  Medication Sig Dispense Refill   NEURONTIN 300 MG capsule Take 1 capsule at bedtime for 3 days, then 1 capsule twice a day for 3 days, then 1 capsule 3 times a day     propranolol (INDERAL) 20 MG tablet TAKE ONE (1) TABLET BY MOUTH 3 TIMES DAILY 90 tablet 1   tiZANidine (ZANAFLEX) 4 MG tablet Take 4 mg by mouth every 8 (eight) hours as needed for muscle spasms.     amphetamine-dextroamphetamine (ADDERALL) 20 MG tablet Take 1 tablet (20 mg total) by mouth 2 (two) times daily. 60 tablet 0   SUMAtriptan (IMITREX) 100 MG tablet SEE NOTES 12 tablet 1   calcitonin, salmon, (MIACALCIN/FORTICAL) 200 UNIT/ACT nasal spray Place 1 spray into alternate nostrils daily. 3.7 mL 12   No facility-administered medications prior to visit.    Allergies  Allergen Reactions   Alendronate Sodium Rash   Codeine Nausea And Vomiting   Hydrocodone-Acetaminophen Nausea And Vomiting    Can take with zofran    Parathyroid Hormone (Recomb) Other (See Comments)    Bone pain and headaches Bone pain and headaches   Requip [Ropinirole] Nausea And Vomiting   Vicodin [Hydrocodone-Acetaminophen] Nausea And Vomiting    Review of Systems  Constitutional:  Negative for fever.  HENT:  Negative for congestion.   Eyes:  Negative for blurred vision.  Respiratory:  Negative for cough.   Cardiovascular:  Negative for chest pain and palpitations.  Gastrointestinal:  Negative for vomiting.  Musculoskeletal:  Negative for back pain.  Skin:  Negative for rash.  Neurological:  Negative for loss of consciousness and headaches.       Objective:    Physical Exam Vitals and nursing note reviewed.  Constitutional:      General: She is not in acute distress.    Appearance: Normal appearance. She is not ill-appearing.  HENT:     Head: Normocephalic and atraumatic.     Right Ear: External ear normal.     Left Ear: External ear normal.   Eyes:     Extraocular Movements: Extraocular movements intact.     Pupils: Pupils are equal, round, and reactive to light.  Cardiovascular:     Rate and Rhythm: Normal rate and regular rhythm.     Heart sounds: Normal heart sounds. No murmur heard.    No gallop.  Pulmonary:     Effort: Pulmonary effort is normal. No respiratory distress.     Breath sounds: Normal breath sounds. No wheezing or rales.  Musculoskeletal:        General: Normal range of motion.     Cervical back: Normal range of motion.  Skin:    General: Skin is warm and dry.  Neurological:     General: No focal deficit present.     Mental Status: She is alert and oriented to person, place, and time.  Psychiatric:        Judgment: Judgment normal.     BP 102/80 (BP Location: Left Arm, Patient Position: Sitting, Cuff Size: Normal)   Pulse 82   Temp 98.1 F (36.7 C) (Oral)   Resp 18   Ht 5\' 9"  (1.753 m)   Wt 117 lb 9.6 oz (53.3 kg)   SpO2 93%   BMI 17.37 kg/m  Wt Readings from Last 3 Encounters:  11/22/22 117 lb 9.6 oz (53.3 kg)  06/13/22 114 lb (51.7 kg)  06/05/22 119 lb 6.4 oz (54.2 kg)       Assessment & Plan:  Anxiety Assessment & Plan: Stable    Other migraine without status migrainosus, not intractable -     SUMAtriptan Succinate; SEE NOTES  Dispense: 12 tablet; Refill: 1  Attention deficit disorder (ADD) without hyperactivity Assessment & Plan: Stable No se with adderall  F/u 6 months   Orders: -     Amphetamine-Dextroamphetamine; Take 1 tablet (20 mg total) by mouth 2 (two) times daily.  Dispense: 60 tablet; Refill: 0 -     Amphetamine-Dextroamphetamine; Take 1 tablet (20 mg total) by mouth 2 (two) times daily.  Dispense: 60 tablet; Refill: 0 -     Amphetamine-Dextroamphetamine; Take 1 tablet (20 mg total) by mouth 2 (two) times daily.  Dispense: 60 tablet; Refill: 0    I, [name], DO, personally preformed the services described in this documentation.  All medical record  entries made by the scribe were at my direction and in my presence.  I have reviewed the chart and discharge instructions (if applicable) and agree that the record reflects my personal performance and is accurate and complete. 11/22/2022   I,Tracy Cook,acting as a scribe for [name], DO.,have documented all relevant documentation on the behalf of [name], DO,as directed by  [name], DO while in the presence of [name], DO.   [name], DO

## 2022-11-22 NOTE — Assessment & Plan Note (Signed)
Stable

## 2022-11-22 NOTE — Addendum Note (Signed)
Addended by: Roxanne Gates on: 11/22/2022 02:51 PM   Modules accepted: Orders

## 2022-11-23 ENCOUNTER — Other Ambulatory Visit: Payer: Self-pay | Admitting: Cardiology

## 2022-11-24 LAB — DRUG MONITORING PANEL 376104, URINE
Amphetamine: 2146 ng/mL — ABNORMAL HIGH (ref <250)
Amphetamines: POSITIVE ng/mL — AB (ref <500)
Barbiturates: NEGATIVE ng/mL (ref <300)
Benzodiazepines: NEGATIVE ng/mL (ref <100)
Cocaine Metabolite: NEGATIVE ng/mL (ref <150)
Desmethyltramadol: NEGATIVE ng/mL (ref <100)
Methamphetamine: NEGATIVE ng/mL (ref <250)
Opiates: NEGATIVE ng/mL (ref <100)
Oxycodone: NEGATIVE ng/mL (ref <100)
Tramadol: NEGATIVE ng/mL (ref <100)

## 2022-11-24 LAB — DM TEMPLATE

## 2022-11-28 ENCOUNTER — Other Ambulatory Visit: Payer: Self-pay | Admitting: Family Medicine

## 2022-11-28 DIAGNOSIS — F988 Other specified behavioral and emotional disorders with onset usually occurring in childhood and adolescence: Secondary | ICD-10-CM

## 2023-03-04 ENCOUNTER — Other Ambulatory Visit: Payer: Self-pay | Admitting: Family Medicine

## 2023-03-04 DIAGNOSIS — F988 Other specified behavioral and emotional disorders with onset usually occurring in childhood and adolescence: Secondary | ICD-10-CM

## 2023-03-04 NOTE — Telephone Encounter (Signed)
Requesting: Adderall 20mg   Contract: 12/04/2022 UDS: 11/22/2022 Last Visit: 11/22/2022 Next Visit: None Last Refill: 01/22/23 #60 and 0RF    Please Advise

## 2023-03-05 MED ORDER — AMPHETAMINE-DEXTROAMPHETAMINE 20 MG PO TABS
20.0000 mg | ORAL_TABLET | Freq: Two times a day (BID) | ORAL | 0 refills | Status: DC
Start: 2023-03-05 — End: 2023-05-07

## 2023-03-15 ENCOUNTER — Telehealth: Payer: Self-pay | Admitting: Family Medicine

## 2023-03-15 NOTE — Telephone Encounter (Signed)
Copied from CRM [id number]. Topic: Medicare AWV >> Mar 15, 2023 10:02 AM Payton Doughty wrote: Reason for CRM: LM 03/15/2023 to schedule AWV   Verlee Rossetti; Care Guide Ambulatory Clinical Support [place] [facility] Direct Dial: [phone]

## 2023-04-03 ENCOUNTER — Encounter: Payer: Self-pay | Admitting: Family Medicine

## 2023-04-04 ENCOUNTER — Other Ambulatory Visit: Payer: Self-pay | Admitting: Family Medicine

## 2023-04-04 DIAGNOSIS — M549 Dorsalgia, unspecified: Secondary | ICD-10-CM

## 2023-04-06 ENCOUNTER — Other Ambulatory Visit: Payer: Self-pay | Admitting: Family Medicine

## 2023-04-08 NOTE — Telephone Encounter (Signed)
Requesting: Adderall Contract: 11/22/22 UDS: 11/22/22 Last OV: 11/22/22 Next OV: n/a Last Refill: 03/05/23, #60--0 RF Database:   Please advise

## 2023-05-01 ENCOUNTER — Telehealth: Payer: Self-pay | Admitting: Family Medicine

## 2023-05-01 NOTE — Telephone Encounter (Signed)
See prev note

## 2023-05-01 NOTE — Telephone Encounter (Signed)
Notes faxed back today

## 2023-05-01 NOTE — Telephone Encounter (Signed)
[facility] called and requested to received the forms they faxed on 9/19, 9/23, and 10/1 for a plan of care for the pt. Please advise.

## 2023-05-07 ENCOUNTER — Other Ambulatory Visit: Payer: Self-pay | Admitting: Family Medicine

## 2023-05-07 NOTE — Telephone Encounter (Signed)
Requesting: Adderall 20mg   Contract: 11/22/22 UDS: 11/22/22 Last Visit: 11/22/22 Next Visit: 05/24/23 Last Refill: 04/08/23 #60 and 0RF   Please Advise

## 2023-05-08 MED ORDER — AMPHETAMINE-DEXTROAMPHETAMINE 20 MG PO TABS: 20.0000 mg | ORAL_TABLET | Freq: Two times a day (BID) | ORAL | 0 refills | Status: DC

## 2023-05-10 ENCOUNTER — Ambulatory Visit: Payer: Medicare Other | Admitting: Family Medicine

## 2023-05-24 ENCOUNTER — Ambulatory Visit (INDEPENDENT_AMBULATORY_CARE_PROVIDER_SITE_OTHER): Payer: Medicare Other | Admitting: Family Medicine

## 2023-05-24 ENCOUNTER — Encounter: Payer: Self-pay | Admitting: Family Medicine

## 2023-05-24 VITALS — BP 104/80 | HR 96 | Temp 97.8°F | Resp 18 | Ht 69.0 in | Wt 122.0 lb

## 2023-05-24 DIAGNOSIS — Z1211 Encounter for screening for malignant neoplasm of colon: Secondary | ICD-10-CM

## 2023-05-24 DIAGNOSIS — Z0000 Encounter for general adult medical examination without abnormal findings: Secondary | ICD-10-CM | POA: Diagnosis not present

## 2023-05-24 DIAGNOSIS — M810 Age-related osteoporosis without current pathological fracture: Secondary | ICD-10-CM | POA: Diagnosis not present

## 2023-05-24 NOTE — Progress Notes (Signed)
Subjective:    Tracy Cook is a 67 y.o. female who presents for a Welcome to Medicare exam.          Objective:    Today's Vitals   05/24/23 1306  BP: 104/80  Pulse: 96  Resp: 18  Temp: 97.8 F (36.6 C)  TempSrc: Oral  SpO2: 98%  Weight: 122 lb (55.3 kg)  Height: 5\' 9"  (1.753 m)  Body mass index is 18.02 kg/m.  Medications Outpatient Encounter Medications as of 05/24/2023  Medication Sig   amphetamine-dextroamphetamine (ADDERALL) 20 MG tablet Take 1 tablet (20 mg total) by mouth 2 (two) times daily.   NEURONTIN 300 MG capsule Take 1 capsule at bedtime for 3 days, then 1 capsule twice a day for 3 days, then 1 capsule 3 times a day   propranolol (INDERAL) 20 MG tablet TAKE ONE (1) TABLET BY MOUTH 3 TIMES DAILY   SUMAtriptan (IMITREX) 100 MG tablet SEE NOTES   tiZANidine (ZANAFLEX) 4 MG tablet Take 4 mg by mouth every 8 (eight) hours as needed for muscle spasms.   No facility-administered encounter medications on file as of 05/24/2023.     History: Past Medical History:  Diagnosis Date   Cardiomyopathy (HCC)    Diverticulosis    Insomnia    Menopause    Migraine    Orthostatic hypotension    Osteoporosis    Other malaise and fatigue    Persistent disorder of initiating or maintaining sleep    POTS (postural orthostatic tachycardia syndrome)    Tachycardia, unspecified    Past Surgical History:  Procedure Laterality Date   COLONOSCOPY  2013   with Dr. Loreta Ave   FINGER SURGERY Left    index-fusion   IR KYPHO LUMBAR INC FX REDUCE BONE BX UNI/BIL CANNULATION INC/IMAGING  11/11/2019   IR KYPHO LUMBAR INC FX REDUCE BONE BX UNI/BIL CANNULATION INC/IMAGING  12/17/2019   IR RADIOLOGIST EVAL & MGMT  11/05/2019   IR RADIOLOGIST EVAL & MGMT  12/09/2019   No prior surgery     WISDOM TOOTH EXTRACTION  1974    Family History  Problem Relation Age of Onset   Stroke Mother    Osteoporosis Mother    Colon polyps Mother 72   Alzheimer's disease Mother    Coronary  artery disease Father        Died of MI at age 82   Asthma Father    Hypertension Father    Sudden death Father    Heart attack Father    Osteoporosis Sister    Diabetes Neg Hx    Hyperlipidemia Neg Hx    Colon cancer Neg Hx    Esophageal cancer Neg Hx    Stomach cancer Neg Hx    Rectal cancer Neg Hx    Social History   Occupational History    Comment: Medial lab technologist  Tobacco Use   Smoking status: Never   Smokeless tobacco: Never  Vaping Use   Vaping status: Never Used  Substance and Sexual Activity   Alcohol use: No   Drug use: No   Sexual activity: Yes    Partners: Male    Tobacco Counseling Pt does not smoke  Immunizations and Health Maintenance Immunization History  Administered Date(s) Administered   Influenza,inj,Quad PF,6+ Mos 04/23/2013   Influenza,inj,quad, With Preservative 04/22/2017   Influenza-Unspecified 05/27/2014, 05/03/2015   Janssen (J&J) SARS-COV-2 Vaccination 12/31/2019   PFIZER Comirnaty(Gray Top)Covid-19 Tri-Sucrose Vaccine 07/28/2020   Health Maintenance Due  Topic Date  Due   DTaP/Tdap/Td (1 - Tdap) Never done   Zoster Vaccines- Shingrix (1 of 2) Never done   MAMMOGRAM  04/10/2022   COVID-19 Vaccine (3 - 2023-24 season) 03/24/2023   Colonoscopy  03/26/2023    Activities of Daily Living    05/24/2023    1:21 PM  In your present state of health, do you have any difficulty performing the following activities:  Hearing? 1  Vision? 0  Difficulty concentrating or making decisions? 0  Walking or climbing stairs? 0  Dressing or bathing? 0  Doing errands, shopping? 0    Physical Exam   BP 104/80 (BP Location: Left Arm, Patient Position: Sitting, Cuff Size: Normal)   Pulse 96   Temp 97.8 F (36.6 C) (Oral)   Resp 18   Ht 5\' 9"  (1.753 m)   Wt 122 lb (55.3 kg)   SpO2 98%   BMI 18.02 kg/m   General Appearance:    Alert, cooperative, no distress, appears stated age  Head:    Normocephalic, without obvious abnormality,  atraumatic  Eyes:    PERRL, conjunctiva/corneas clear, EOM's intact, fundi    benign, both eyes  Ears:    Normal TM's and external ear canals, both ears  Nose:   Nares normal, septum midline, mucosa normal, no drainage    or sinus tenderness  Throat:   Lips, mucosa, and tongue normal; teeth and gums normal  Neck:   Supple, symmetrical, trachea midline, no adenopathy;    thyroid:  no enlargement/tenderness/nodules; no carotid   bruit or JVD  Back:     Symmetric, no curvature, ROM normal, no CVA tenderness  Lungs:     Clear to auscultation bilaterally, respirations unlabored  Chest Wall:    No tenderness or deformity   Heart:    Regular rate and rhythm, S1 and S2 normal, no murmur, rub   or gallop     Abdomen:     Soft, non-tender, bowel sounds active all four quadrants,    no masses, no organomegaly        Extremities:   Extremities normal, atraumatic, no cyanosis or edema  Pulses:   2+ and symmetric all extremities  Skin:   Skin color, texture, turgor normal, no rashes or lesions  Lymph nodes:   Cervical, supraclavicular, and axillary nodes normal  Neurologic:   CNII-XII intact, normal strength, sensation and reflexes    throughout     Advanced Directives:  Pt has living will and POA-- she will bring in a copy  EKG:  normal EKG, normal sinus rhythm, unchanged from previous tracings, sinus tachycardia      Assessment:    This is a routine wellness examination for this patient .   Vision/Hearing screen Vision Screening   Right eye Left eye Both eyes  Without correction     With correction   20/20  Hearing Screening - Comments:: Pt has troble hearing in large nosy places    Goals   None     Depression Screen    05/24/2023    1:09 PM 06/05/2022    2:31 PM 12/06/2020    2:17 PM 02/01/2020    2:22 PM  PHQ 2/9 Scores  PHQ - 2 Score 0 0 0 0     Fall Risk    05/24/2023    1:09 PM  Fall Risk   Falls in the past year? 0  Number falls in past yr: 0  Injury with  Fall? 0  Follow up Falls  evaluation completed    Cognitive Function:    05/24/2023    1:24 PM  MMSE - Mini Mental State Exam  Orientation to time 5  Orientation to Place 5  Registration 3  Attention/ Calculation 5  Recall 3  Language- name 2 objects 2  Language- repeat 1  Language- follow 3 step command 3  Language- read & follow direction 1  Write a sentence 1  Copy design 1  Total score 30        Patient Care Team: Zola Button, Grayling Congress, DO as PCP - General (Family Medicine) Braden, Almond Lint, DO as Consulting Physician (Psychiatry) Sheran Luz, MD as Consulting Physician (Physical Medicine and Rehabilitation) Drema Dallas, DO as Consulting Physician (Neurology) Larence Penning, OD as Referring Physician (Optometry)     Plan:   Assessment and Plan   1. Colon cancer screening  - Ambulatory referral to Gastroenterology  2. Osteoporosis, unspecified osteoporosis type, unspecified pathological fracture presence Pt refuses bmd and is unable to take any of the meds   3. Welcome to Medicare preventive visit  - Comprehensive metabolic panel; Future - Lipid panel; Future - TSH; Future  4. Encounter for Medicare annual wellness exam   Hearing Loss Reports difficulty hearing, especially in noisy environments. Has not pursued hearing aids due to previous negative experience. -Consider referral to audiology for hearing evaluation and potential hearing aid fitting.  Osteoporosis History of compression fractures, but has not had a fracture in two years. Unable to tolerate osteoporosis medications. -No further bone density testing at this time per patient preference.  Breast Cancer Screening History of dense breasts and previous abnormal mammograms requiring additional imaging and biopsy. -Schedule mammogram at local facility.  Colon Cancer Screening Due for colonoscopy. Previous colonoscopies performed in [place]. -Referral to Dr. Jasmine December in [facility] for  colonoscopy. Patient to sign release for previous colonoscopy records.  Immunizations Due for tetanus and shingles vaccines. -Obtain tetanus and shingles vaccines at pharmacy.  General Health Maintenance -Obtain fasting labs at next visit. -Continue regular skin checks for skin cancer screening.        I have personally reviewed and noted the following in the patient's chart:   Medical and social history Use of alcohol, tobacco or illicit drugs  Current medications and supplements Functional ability and status Nutritional status Physical activity Advanced directives List of other physicians Hospitalizations, surgeries, and ER visits in previous 12 months Vitals Screenings to include cognitive, depression, and falls Referrals and appointments  In addition, I have reviewed and discussed with patient certain preventive protocols, quality metrics, and best practice recommendations. A written personalized care plan for preventive services as well as general preventive health recommendations were provided to patient.     Lelon Perla Chase, DO 05/24/2023

## 2023-06-10 ENCOUNTER — Other Ambulatory Visit: Payer: Self-pay | Admitting: Family Medicine

## 2023-06-10 MED ORDER — AMPHETAMINE-DEXTROAMPHETAMINE 20 MG PO TABS: 20.0000 mg | ORAL_TABLET | Freq: Two times a day (BID) | ORAL | 0 refills | Status: DC

## 2023-06-10 NOTE — Telephone Encounter (Signed)
Requesting: Adderall 20mg  Contract: 11/22/2022 UDS: 11/22/2022 Last Visit: 05/24/2023 Next Visit: N/A Last Refill: 05/08/2023  Please Advise

## 2023-07-21 ENCOUNTER — Other Ambulatory Visit: Payer: Self-pay | Admitting: Family Medicine

## 2023-07-22 MED ORDER — AMPHETAMINE-DEXTROAMPHETAMINE 20 MG PO TABS: 20.0000 mg | ORAL_TABLET | Freq: Two times a day (BID) | ORAL | 0 refills | Status: DC

## 2023-07-22 NOTE — Telephone Encounter (Signed)
Requesting: Adderall 20mg  Contract: 11/22/2022 UDS: 11/22/2022 Last Visit: 05/24/2023 Next Visit: 08/26/2023 Last Refill: 06/10/2023 #60 no refills   Please Advise

## 2023-08-09 ENCOUNTER — Telehealth: Payer: Self-pay | Admitting: Cardiology

## 2023-08-09 ENCOUNTER — Other Ambulatory Visit: Payer: Self-pay

## 2023-08-09 MED ORDER — PROPRANOLOL HCL 20 MG PO TABS: 20.0000 mg | ORAL_TABLET | Freq: Three times a day (TID) | ORAL | 3 refills | Status: DC

## 2023-08-09 NOTE — Telephone Encounter (Signed)
*  STAT* If patient is at the pharmacy, call can be transferred to refill team.   1. Which medications need to be refilled? (please list name of each medication and dose if known) propranolol (INDERAL) 20 MG tablet   2. Which pharmacy/location (including street and city if local pharmacy) is medication to be sent to?  [facility] - [place], [place] - [address] Phone: [phone]  Fax: [phone]      3. Do they need a 30 day or 90 day supply? 90

## 2023-08-09 NOTE — Telephone Encounter (Signed)
Called patient and informed her that her Inderal medication had been re-filled. Patient verbalized understanding and had no further questions at this time.

## 2023-08-13 NOTE — Progress Notes (Signed)
Cardiology Office Note:  .   Date:  08/16/2023  ID:  Tracy Cook, DOB 1956-02-04, MRN [id number] PCP: Zola Button, Grayling Congress, DO  [facility] Providers Cardiologist:  Dr. Jens Som   History of Present Illness: .   Tracy Cook is a 68 y.o. female with known history of coronary artery disease cardiomyopathy and POTS.  Coronary CTA in March 2021 showed a calcium score of 11 with moderate (50 to 69% (mixed plaque stenosis of the proximal LAD with FFR in the LAD of 0.83 not suggestive of significant stenosis.  She also has complaints of palpitations and heart racing.  Of note the patient is on Adderall 20 mg twice a day.  Seen by Dr. Jens Som on 06/13/2022.  The patient was continued on current medication regimen at that time without any new testing.  She is doing very well today is here for refills on propranolol.  She usually walks and stays active during the day.  Getting in between 8 and 10,000 steps normally.  She offers no complaints of rapid heart rhythm, dizziness, chest pain or shortness of breath.  ROS: As above otherwise negative.  Studies Reviewed: Marland Kitchen   EKG Interpretation Date/Time:  Friday August 16 2023 10:37:38 EST Ventricular Rate:  66 PR Interval:  166 QRS Duration:  78 QT Interval:  414 QTC Calculation: 434 R Axis:   57  Text Interpretation: Normal sinus rhythm Normal ECG No previous ECGs available Confirmed by Joni Reining [phone]) on 08/16/2023 10:55:32 AM  Cardiac CTA 10/20/2019 IMPRESSION: 1. Coronary calcium score of 11. This was 67th percentile for age and sex matched controls.   2. Normal coronary origin with left dominance.   3. Moderate mixed density plaque in the proximal LAD (50-69%).   RECOMMENDATIONS: 1. Moderate stenosis in the proximal LAD. Consider symptom-guided anti-ischemic pharmacotherapy as well as risk factor modification per guideline directed care.    EKG Interpretation Date/Time:  Friday August 16 2023 10:37:38  EST Ventricular Rate:  66 PR Interval:  166 QRS Duration:  78 QT Interval:  414 QTC Calculation: 434 R Axis:   57  Text Interpretation: Normal sinus rhythm Normal ECG No previous ECGs available Confirmed by Joni Reining [phone]) on 08/16/2023 10:55:32 AM    Physical Exam:   VS:  BP 128/88 (BP Location: Left Arm, Patient Position: Sitting, Cuff Size: Small)   Pulse 66   Ht 5\' 9"  (1.753 m)   Wt 117 lb (53.1 kg)   SpO2 100%   BMI 17.28 kg/m    Wt Readings from Last 3 Encounters:  08/16/23 117 lb (53.1 kg)  05/24/23 122 lb (55.3 kg)  11/22/22 117 lb 9.6 oz (53.3 kg)    GEN: Well nourished, well developed in no acute distress NECK: No JVD; No carotid bruits CARDIAC: RRR, no murmurs, rubs, gallops RESPIRATORY:  Clear to auscultation without rales, wheezing or rhonchi  ABDOMEN: Soft, non-tender, non-distended EXTREMITIES:  No edema; No deformity   ASSESSMENT AND PLAN: .    Coronary artery disease: History of moderate 50 to 69% mixed stenosis of the proximal LAD with FFR in the LAD of 0.83 not suggestive of significant stenosis for cardiac CTA.  She continues on secondary prevention to include purposeful exercise which she is doing, blood pressure control, and low-cholesterol diet.  She overall is doing very well.     2.  POTS: Well-controlled on propranolol.  Refills are provided.  She is asymptomatic currently.   Signed, Bettey Mare. Liborio Nixon, ANP, AACC

## 2023-08-16 ENCOUNTER — Ambulatory Visit: Payer: Medicare Other | Attending: Adult Health | Admitting: Adult Health

## 2023-08-16 ENCOUNTER — Encounter: Payer: Self-pay | Admitting: Adult Health

## 2023-08-16 VITALS — BP 128/88 | HR 66 | Ht 69.0 in | Wt 117.0 lb

## 2023-08-16 DIAGNOSIS — I2510 Atherosclerotic heart disease of native coronary artery without angina pectoris: Secondary | ICD-10-CM | POA: Diagnosis not present

## 2023-08-16 MED ORDER — PROPRANOLOL HCL 20 MG PO TABS: 20.0000 mg | ORAL_TABLET | Freq: Three times a day (TID) | ORAL | 3 refills | Status: AC

## 2023-08-16 NOTE — Patient Instructions (Signed)
Medication Instructions:  No changes *If you need a refill on your cardiac medications before your next appointment, please call your pharmacy*   Lab Work: No Labs If you have labs (blood work) drawn today and your tests are completely normal, you will receive your results only by: MyChart Message (if you have MyChart) OR A paper copy in the mail If you have any lab test that is abnormal or we need to change your treatment, we will call you to review the results.   Testing/Procedures: No Testing   Follow-Up: At [facility], you and your health needs are our priority.  As part of our continuing mission to provide you with exceptional heart care, we have created designated Provider Care Teams.  These Care Teams include your primary Cardiologist (physician) and Advanced Practice Providers (APPs -  Physician Assistants and Nurse Practitioners) who all work together to provide you with the care you need, when you need it.  We recommend signing up for the patient portal called "MyChart".  Sign up information is provided on this After Visit Summary.  MyChart is used to connect with patients for Virtual Visits (Telemedicine).  Patients are able to view lab/test results, encounter notes, upcoming appointments, etc.  Non-urgent messages can be sent to your provider as well.   To learn more about what you can do with MyChart, go to [email or web address].    Your next appointment:   1 year(s)  Provider:   Olga Millers, MD

## 2023-08-22 ENCOUNTER — Other Ambulatory Visit: Payer: Self-pay | Admitting: Family Medicine

## 2023-08-23 MED ORDER — AMPHETAMINE-DEXTROAMPHETAMINE 20 MG PO TABS: 20.0000 mg | ORAL_TABLET | Freq: Two times a day (BID) | ORAL | 0 refills | Status: DC

## 2023-08-23 NOTE — Telephone Encounter (Signed)
Requesting: Adderall 20mg   Contract: 11/22/22 UDS: 11/22/22 Last Visit: 05/24/23 Next Visit: None Last Refill: 07/22/23 #60 and 0RF   Please Advise

## 2023-09-26 ENCOUNTER — Other Ambulatory Visit: Payer: Self-pay | Admitting: Family Medicine

## 2023-09-26 MED ORDER — AMPHETAMINE-DEXTROAMPHETAMINE 20 MG PO TABS: 20.0000 mg | ORAL_TABLET | Freq: Two times a day (BID) | ORAL | 0 refills | Status: DC

## 2023-09-26 NOTE — Telephone Encounter (Signed)
 Requesting: Adderall 20mg  Contract: 11/22/22 UDS: 11/22/22 Last Visit: 05/24/23 Next Visit: None Last Refill: 08/23/23 #60 and 0RF   Please Advise

## 2023-10-30 ENCOUNTER — Other Ambulatory Visit: Payer: Self-pay | Admitting: Family Medicine

## 2023-10-30 MED ORDER — AMPHETAMINE-DEXTROAMPHETAMINE 20 MG PO TABS
20.0000 mg | ORAL_TABLET | Freq: Two times a day (BID) | ORAL | 0 refills | Status: DC
Start: 2023-10-30 — End: 2023-12-23

## 2023-10-30 NOTE — Telephone Encounter (Signed)
 Requesting: Adderall 20 mg Contract: 11/22/2022 UDS: 11/22/2022 Last Visit: 05/24/2023 Next Visit: N/A Last refill: 09/26/2023  Please Advise

## 2023-12-23 ENCOUNTER — Encounter: Payer: Self-pay | Admitting: Family Medicine

## 2023-12-23 ENCOUNTER — Ambulatory Visit (INDEPENDENT_AMBULATORY_CARE_PROVIDER_SITE_OTHER): Admitting: Family Medicine

## 2023-12-23 VITALS — BP 119/83 | HR 90 | Temp 97.7°F | Resp 16 | Ht 69.0 in | Wt 116.6 lb

## 2023-12-23 DIAGNOSIS — F988 Other specified behavioral and emotional disorders with onset usually occurring in childhood and adolescence: Secondary | ICD-10-CM

## 2023-12-23 MED ORDER — AMPHETAMINE-DEXTROAMPHETAMINE 20 MG PO TABS: 20.0000 mg | ORAL_TABLET | Freq: Two times a day (BID) | ORAL | 0 refills | Status: DC

## 2023-12-23 NOTE — Progress Notes (Signed)
 Established Patient Office Visit  Subjective   Patient ID: Tracy Cook, female    DOB: [birth year]  Age: 67 y.o. MRN: [id number]  Chief Complaint  Patient presents with   ADHD    Here for follow up, "doing well on Adderall"    HPI Discussed the use of AI scribe software for clinical note transcription with the patient, who gave verbal consent to proceed.  History of Present Illness Tracy Cook is a 68 year old female who presents for follow-up on ADD management.  She is currently taking Adderall 20 mg twice a day without any issues, including no chest pain or other adverse effects, and requires a refill.  She was recently hospitalized due to a high fever of 104F. Extensive testing, including a lumbar puncture, was performed, but no specific cause was identified. She was treated with vancomycin and doxycycline, the latter of which caused a rash and was discontinued. Her symptoms resolved, but she experienced a significant migraine during the hospital stay.  She occasionally experiences severe back pain and takes half an oxycontin as prescribed by her orthopedist. She prefers not to have it show up in a drug screen.  She has concerns about mammograms due to dense breast tissue and inconclusive results in the past. She is interested in alternative screening methods that do not involve radiation.    Patient Active Problem List   Diagnosis Date Noted   Weakness 11/30/2021   Opiate withdrawal (HCC) 11/30/2021   Mid back pain on left side 06/05/2021   Attention and concentration deficit 11/08/2020   Anxiety 11/08/2020   Attention deficit disorder (ADD) without hyperactivity 04/20/2018   Preventative health care 11/13/2016   Chronic migraine without aura without status migrainosus, not intractable 08/19/2015   Osteoporosis 09/22/2014   Compression fracture of L3 vertebra (HCC) 09/22/2014   Lumbar spondylosis 03/19/2013   Conjunctivitis 06/30/2012   Vaginal dryness,  menopausal 06/30/2012   Exposure to influenza 06/30/2012   Toe pain 06/25/2012   Colon polyps 04/17/2012   Dermatitis 03/31/2012   Postmenopausal atrophic vaginitis 03/31/2012   Menopause 01/17/2012   POTS (postural orthostatic tachycardia syndrome) 01/17/2012   Congestive dilated cardiomyopathy (HCC) 11/07/2011   Chest pain 11/07/2011   UNSPECIFIED TACHYCARDIA 01/12/2009   Migraine 12/15/2008   INADEQUATE SLEEP HYGIENE 04/22/2008   PERSISTENT DISORDER INITIATING/MAINTAINING SLEEP 03/11/2008   Other malaise and fatigue 11/27/2007   MVP (mitral valve prolapse) 10/30/2007   POSTURAL HYPOTENSION 10/30/2007   HYPERMOBILITY SYNDROME 10/30/2007   Past Medical History:  Diagnosis Date   Cardiomyopathy (HCC)    Diverticulosis    Insomnia    Menopause    Migraine    Orthostatic hypotension    Osteoporosis    Other malaise and fatigue    Persistent disorder of initiating or maintaining sleep    POTS (postural orthostatic tachycardia syndrome)    Tachycardia, unspecified    Past Surgical History:  Procedure Laterality Date   COLONOSCOPY  2013   with Dr. Tova Fresh   FINGER SURGERY Left    index-fusion   IR KYPHO LUMBAR INC FX REDUCE BONE BX UNI/BIL CANNULATION INC/IMAGING  11/11/2019   IR KYPHO LUMBAR INC FX REDUCE BONE BX UNI/BIL CANNULATION INC/IMAGING  12/17/2019   IR RADIOLOGIST EVAL & MGMT  11/05/2019   IR RADIOLOGIST EVAL & MGMT  12/09/2019   No prior surgery     WISDOM TOOTH EXTRACTION  1974   Social History   Tobacco Use   Smoking status: Never   Smokeless  tobacco: Never  Vaping Use   Vaping status: Never Used  Substance Use Topics   Alcohol use: No   Drug use: No   Social History   Socioeconomic History   Marital status: Married    Spouse name: Not on file   Number of children: 2   Years of education: Not on file   Highest education level: Bachelor's degree (e.g., BA, AB, BS)  Occupational History    Comment: Medial lab technologist  Tobacco Use   Smoking  status: Never   Smokeless tobacco: Never  Vaping Use   Vaping status: Never Used  Substance and Sexual Activity   Alcohol use: No   Drug use: No   Sexual activity: Yes    Partners: Male  Other Topics Concern   Not on file  Social History Narrative   Exercise almost every day   Social Drivers of Health   Financial Resource Strain: Low Risk  (12/22/2023)   Overall Financial Resource Strain (CARDIA)    Difficulty of Paying Living Expenses: Not hard at all  Food Insecurity: No Food Insecurity (12/22/2023)   Hunger Vital Sign    Worried About Running Out of Food in the Last Year: Never true    Ran Out of Food in the Last Year: Never true  Transportation Needs: No Transportation Needs (12/22/2023)   PRAPARE - Administrator, Civil Service (Medical): No    Lack of Transportation (Non-Medical): No  Physical Activity: Sufficiently Active (12/22/2023)   Exercise Vital Sign    Days of Exercise per Week: 5 days    Minutes of Exercise per Session: 30 min  Stress: No Stress Concern Present (12/22/2023)   [employer] of Occupational Health - Occupational Stress Questionnaire    Feeling of Stress : Not at all  Social Connections: Socially Integrated (12/22/2023)   Social Connection and Isolation Panel [NHANES]    Frequency of Communication with Friends and Family: More than three times a week    Frequency of Social Gatherings with Friends and Family: More than three times a week    Attends Religious Services: More than 4 times per year    Active Member of [employer] or Organizations: Yes    Attends Engineer, structural: More than 4 times per year    Marital Status: Married  Catering manager Violence: Unknown (10/26/2021)   Received from [employer], [facility]   HITS    Physically Hurt: Not on file    Insult or Talk Down To: Not on file    Threaten Physical Harm: Not on file    Scream or Curse: Not on file   Family Status  Relation Name Status   Mother  Deceased at age  51   Father  Deceased   Sister  (Not Specified)   Neg Hx  (Not Specified)  No partnership data on file   Family History  Problem Relation Age of Onset   Stroke Mother    Osteoporosis Mother    Colon polyps Mother 24   Alzheimer's disease Mother    Coronary artery disease Father        Died of MI at age 59   Asthma Father    Hypertension Father    Sudden death Father    Heart attack Father    Osteoporosis Sister    Diabetes Neg Hx    Hyperlipidemia Neg Hx    Colon cancer Neg Hx    Esophageal cancer Neg Hx    Stomach  cancer Neg Hx    Rectal cancer Neg Hx    Allergies  Allergen Reactions   Alendronate  Sodium Rash   Codeine Nausea And Vomiting   Hydrocodone -Acetaminophen  Nausea And Vomiting    Can take with zofran     Parathyroid Hormone (Recomb) Other (See Comments)    Bone pain and headaches Bone pain and headaches   Requip  [Ropinirole ] Nausea And Vomiting   Vicodin [Hydrocodone -Acetaminophen ] Nausea And Vomiting       Review of Systems  Constitutional:  Negative for chills, fever and malaise/fatigue.  HENT:  Negative for congestion and hearing loss.   Eyes:  Negative for blurred vision and discharge.  Respiratory:  Negative for cough, sputum production and shortness of breath.   Cardiovascular:  Negative for chest pain, palpitations and leg swelling.  Gastrointestinal:  Negative for abdominal pain, blood in stool, constipation, diarrhea, heartburn, nausea and vomiting.  Genitourinary:  Negative for dysuria, frequency, hematuria and urgency.  Musculoskeletal:  Negative for back pain, falls and myalgias.  Skin:  Negative for rash.  Neurological:  Negative for dizziness, sensory change, loss of consciousness, weakness and headaches.  Endo/Heme/Allergies:  Negative for environmental allergies. Does not bruise/bleed easily.  Psychiatric/Behavioral:  Negative for depression and suicidal ideas. The patient is not nervous/anxious and does not have insomnia.        Objective:     BP 119/83 (BP Location: Right Arm, Patient Position: Sitting, Cuff Size: Small)   Pulse 90   Temp 97.7 F (36.5 C) (Oral)   Resp 16   Ht 5\' 9"  (1.753 m)   Wt 116 lb 9.6 oz (52.9 kg)   SpO2 99%   BMI 17.22 kg/m  BP Readings from Last 3 Encounters:  12/23/23 119/83  08/16/23 128/88  05/24/23 104/80   Wt Readings from Last 3 Encounters:  12/23/23 116 lb 9.6 oz (52.9 kg)  08/16/23 117 lb (53.1 kg)  05/24/23 122 lb (55.3 kg)   SpO2 Readings from Last 3 Encounters:  12/23/23 99%  08/16/23 100%  05/24/23 98%      Physical Exam Vitals and nursing note reviewed.  Constitutional:      General: She is not in acute distress.    Appearance: Normal appearance. She is well-developed.  HENT:     Head: Normocephalic and atraumatic.  Eyes:     General: No scleral icterus.       Right eye: No discharge.        Left eye: No discharge.  Cardiovascular:     Rate and Rhythm: Normal rate and regular rhythm.     Heart sounds: No murmur heard. Pulmonary:     Effort: Pulmonary effort is normal. No respiratory distress.     Breath sounds: Normal breath sounds.  Musculoskeletal:        General: Normal range of motion.     Cervical back: Normal range of motion and neck supple.     Right lower leg: No edema.     Left lower leg: No edema.  Skin:    General: Skin is warm and dry.  Neurological:     General: No focal deficit present.     Mental Status: She is alert and oriented to person, place, and time.  Psychiatric:        Mood and Affect: Mood normal.        Behavior: Behavior normal.        Thought Content: Thought content normal.        Judgment: Judgment normal.  No results found for any visits on 12/23/23.  Last CBC Lab Results  Component Value Date   WBC 9.8 11/30/2021   HGB 14.8 11/30/2021   HCT 44.3 11/30/2021   MCV 89.9 11/30/2021   MCH 30.2 12/17/2019   RDW 13.2 11/30/2021   PLT 330.0 11/30/2021   Last metabolic panel Lab Results   Component Value Date   GLUCOSE 93 11/30/2021   NA 141 11/30/2021   K 4.0 11/30/2021   CL 99 11/30/2021   CO2 34 (H) 11/30/2021   BUN 22 11/30/2021   CREATININE 1.33 (H) 11/30/2021   GFR 42.21 (L) 11/30/2021   CALCIUM 10.3 11/30/2021   PROT 6.7 11/30/2021   ALBUMIN 4.4 11/30/2021   BILITOT 0.5 11/30/2021   ALKPHOS 79 11/30/2021   AST 24 11/30/2021   ALT 21 11/30/2021   Last lipids Lab Results  Component Value Date   CHOL 149 02/05/2020   HDL 63.60 02/05/2020   LDLCALC 66 02/05/2020   TRIG 98.0 02/05/2020   CHOLHDL 2 02/05/2020   Last hemoglobin A1c Lab Results  Component Value Date   HGBA1C 5.8 02/07/2017   Last thyroid  functions Lab Results  Component Value Date   TSH 0.27 (L) 11/30/2021   Last vitamin D  Lab Results  Component Value Date   VD25OH 60.78 11/30/2021   Last vitamin B12 and Folate Lab Results  Component Value Date   VITAMINB12 1,203 (H) 11/30/2021      The ASCVD Risk score (Arnett DK, et al., 2019) failed to calculate for the following reasons:   Cannot find a previous HDL lab   Cannot find a previous total cholesterol lab    Assessment & Plan:   Problem List Items Addressed This Visit       Unprioritized   Attention deficit disorder (ADD) without hyperactivity - Primary   Relevant Medications   amphetamine -dextroamphetamine  (ADDERALL) 20 MG tablet (Start on 02/22/2024)   amphetamine -dextroamphetamine  (ADDERALL) 20 MG tablet (Start on 01/22/2024)   amphetamine -dextroamphetamine  (ADDERALL) 20 MG tablet  Assessment and Plan Assessment & Plan Migraine   She experienced a severe migraine with a high fever during a recent hospital visit, treated with IV medication.  Fever of unknown origin   A fever of 104F occurred during a recent hospital visit. Extensive testing, including a lumbar puncture, did not identify a definitive cause. The fever resolved after treatment with vancomycin and doxycycline.  Adverse reaction to doxycycline   A rash  developed after taking doxycycline, leading to discontinuation of the medication.  Attention Deficit Disorder   ADD is managed with Adderall, currently taking 20 mg twice a day. Prescribe Adderall 20 mg twice a day for June, July, and August.  General Health Maintenance   She expressed concerns about radiation exposure from mammograms due to dense breast tissue and prefers an alternative screening method, although not covered by insurance. Discuss alternative breast screening options and inform about insurance coverage. Administer pneumonia vaccine today. Advise to obtain shingles and tetanus vaccines at the pharmacy.    Return in about 6 months (around 06/23/2024), or if symptoms worsen or fail to improve.    Jahari Wiginton R Lowne Chase, DO

## 2023-12-23 NOTE — Patient Instructions (Signed)
 Living With Attention Deficit Hyperactivity Disorder If you have been diagnosed with attention deficit hyperactivity disorder (ADHD), you may be relieved that you now know why you have felt or behaved a certain way. Still, you may feel overwhelmed about the treatment ahead. You may also wonder how to get the support you need and how to deal with the condition day-to-day. With treatment and support, you can live with ADHD and manage your symptoms. How to manage lifestyle changes Managing lifestyle changes can be challenging. Seeking support from your healthcare provider, therapist, family, and friends can be helpful. How to recognize changes in your condition The following signs may mean that your treatment is working well and your condition is improving: Consistently being on time for appointments. Being more organized at home and work. Other people noticing improvements in your behavior. Achieving goals that you set for yourself. Thinking more clearly. The following signs may mean that your treatment is not working very well: Feeling impatience or more confusion. Missing, forgetting, or being late for appointments. An increasing sense of disorganization and messiness. More difficulty in reaching goals that you set for yourself. Loved ones becoming angry or frustrated with you. Follow these instructions at home: Medicines Take over-the-counter and prescription medicines only as told by your health care provider. Check with your health care provider before taking any new medicines. General instructions Create structure and an organized atmosphere at home. For example: Make a list of tasks, then rank them from most important to least important. Work on one task at a time until your listed tasks are done. Make a daily schedule and follow it consistently every day. Use an appointment calendar, and check it 2-3 times a day to keep on track. Keep it with you when you leave the house. Create  spaces where you keep certain things, and always put things back in their places after you use them. Keep all follow-up visits. Your health care provider will need to monitor your condition and adjust your treatment over time. Where to find support Talking to others  Keep emotion out of important discussions and speak in a calm, logical way. Listen closely and patiently to your loved ones. Try to understand their point of view, and try to avoid getting defensive. Take responsibility for the consequences of your actions. Ask that others do not take your behaviors personally. Aim to solve problems as they come up, and express your feelings instead of bottling them up. Talk openly about what you need from your loved ones and how they can support you. Consider going to family therapy sessions or having your family meet with a specialist who deals with ADHD-related behavior problems. Finances Not all insurance plans cover mental health care, so it is important to check with your insurance carrier. If paying for co-pays or counseling services is a problem, search for a local or county mental health care center. Public mental health care services may be offered there at a low cost or no cost when you are not able to see a private health care provider. If you are taking medicine for ADHD, you may be able to get the generic form, which may be less expensive than brand-name medicine. Some makers of prescription medicines also offer help to patients who cannot afford the medicines that they need. Therapy and support groups Talking with a mental health care provider and participating in support groups can help to improve your quality of life, daily functioning, and overall symptoms. Questions to ask your health  care provider: What are the risks and benefits of taking medicines? Would I benefit from therapy? How often should I follow up with a health care provider? Where to find more information Learn more  about ADHD from: Children and Adults with Attention Deficit Hyperactivity Disorder: chadd[employer] of Mental Health: BloggerCourse.com Centers for Disease Control and Prevention: TonerPromos.no Contact a health care provider if: You have side effects from your medicines, such as: Repeated muscle twitches, coughing, or speech outbursts. Sleep problems. Loss of appetite. Dizziness. Unusually fast heartbeat. Stomach pains. Headaches. You have new or worsening behavior problems. You are struggling with anxiety, depression, or substance abuse. Get help right away if: You have a severe reaction to a medicine. These symptoms may be an emergency. Get help right away. Call 911. Do not wait to see if the symptoms will go away. Do not drive yourself to the hospital. Take one of these steps if you feel like you may hurt yourself or others, or have thoughts about taking your own life: Go to your nearest emergency room. Call 911. Call the National Suicide Prevention Lifeline at (989) 115-0802 or 988. This is open 24 hours a day. Text the Crisis Text Line at 802-008-3988. Summary With treatment and support, you can live with ADHD and manage your symptoms. Consider taking part in family therapy or self-help groups with family members or friends. When you talk with friends and family about your ADHD, be patient and communicate openly. Keep all follow-up visits. Your health care provider will need to monitor your condition and adjust your treatment over time. This information is not intended to replace advice given to you by your health care provider. Make sure you discuss any questions you have with your health care provider. Document Revised: 10/27/2021 Document Reviewed: 10/27/2021 Elsevier Patient Education  2024 [employer].

## 2024-02-18 ENCOUNTER — Encounter: Payer: Self-pay | Admitting: Family Medicine

## 2024-02-18 DIAGNOSIS — N819 Female genital prolapse, unspecified: Secondary | ICD-10-CM

## 2024-02-19 NOTE — Telephone Encounter (Signed)
Okay to place referrals? 

## 2024-03-30 ENCOUNTER — Other Ambulatory Visit: Payer: Self-pay | Admitting: Family Medicine

## 2024-03-30 DIAGNOSIS — G43809 Other migraine, not intractable, without status migrainosus: Secondary | ICD-10-CM

## 2024-05-28 ENCOUNTER — Ambulatory Visit

## 2024-06-16 ENCOUNTER — Ambulatory Visit: Admitting: Obstetrics

## 2024-06-22 ENCOUNTER — Ambulatory Visit

## 2024-06-22 ENCOUNTER — Ambulatory Visit: Admitting: *Deleted

## 2024-06-22 ENCOUNTER — Ambulatory Visit: Admitting: Family Medicine

## 2024-06-22 VITALS — BP 113/83 | HR 86 | Temp 97.8°F | Resp 16 | Ht 69.0 in | Wt 119.8 lb

## 2024-06-22 DIAGNOSIS — Z0000 Encounter for general adult medical examination without abnormal findings: Secondary | ICD-10-CM | POA: Diagnosis not present

## 2024-06-22 DIAGNOSIS — G43809 Other migraine, not intractable, without status migrainosus: Secondary | ICD-10-CM

## 2024-06-22 MED ORDER — SUMATRIPTAN SUCCINATE 100 MG PO TABS: ORAL_TABLET | ORAL | 1 refills | Status: AC

## 2024-06-22 NOTE — Progress Notes (Signed)
 Chief Complaint  Patient presents with   Medicare Wellness     Subjective:   Tracy Cook is a 68 y.o. female who presents for a Medicare Annual Wellness Visit.  Visit info / Clinical Intake: Medicare Wellness Visit Type:: Subsequent Annual Wellness Visit Persons participating in visit and providing information:: patient Medicare Wellness Visit Mode:: In-person (required for WTM) Interpreter Needed?: No Pre-visit prep was completed: yes AWV questionnaire completed by patient prior to visit?: yes Date:: 06/21/24 Living arrangements:: lives with spouse/significant other Patient's Overall Health Status Rating: good Typical amount of pain: some Does pain affect daily life?: (!) yes Are you currently prescribed opioids?: (!) yes  Dietary Habits and Nutritional Risks How many meals a day?: 3 Eats fruit and vegetables daily?: yes Most meals are obtained by: preparing own meals In the last 2 weeks, have you had any of the following?: none Diabetic:: no  Functional Status Activities of Daily Living (to include ambulation/medication): (Patient-Rptd) Independent Ambulation: (Patient-Rptd) Independent Medication Administration: Independent Home Management (perform basic housework or laundry): (Patient-Rptd) Independent Manage your own finances?: yes Primary transportation is: driving Concerns about vision?: no *vision screening is required for WTM* (up to date with [employer] [facility]) Concerns about hearing?: (!) yes (tried hearing aids previously and didn't feel like they were beneficial. Ok with her status currently. Has difficulty in crowds.) Uses hearing aids?: no  Fall Screening Falls in the past year?: (Patient-Rptd) 0 Number of falls in past year: 0 Was there an injury with Fall?: 0 Fall Risk Category Calculator: 0 Patient Fall Risk Level: Low Fall Risk  Fall Risk Patient at Risk for Falls Due to: Orthopedic patient Fall risk Follow up: Falls evaluation  completed  Home and Transportation Safety: All rugs have non-skid backing?: yes All stairs or steps have railings?: yes Grab bars in the bathtub or shower?: yes Have non-skid surface in bathtub or shower?: (!) no Good home lighting?: yes Regular seat belt use?: yes Hospital stays in the last year:: (!) yes How many hospital stays:: 1 Reason: fever  Cognitive Assessment Difficulty concentrating, remembering, or making decisions? : yes (notes it's not as good as it used to be but no major concerns.) Will 6CIT or Mini Cog be Completed: yes What year is it?: 0 points What month is it?: 0 points Give patient an address phrase to remember (5 components): [address], [place]  About what time is it?: 0 points Count backwards from 20 to 1: 0 points Say the months of the year in reverse: 0 points Repeat the address phrase from earlier: 0 points 6 CIT Score: 0 points  Advance Directives (For Healthcare) Does Patient Have a Medical Advance Directive?: Yes Does patient want to make changes to medical advance directive?: No - Patient declined Type of Advance Directive: Healthcare Power of [place]; Living will Copy of Healthcare Power of Attorney in Chart?: No - copy requested Copy of Living Will in Chart?: No - copy requested  Reviewed/Updated  Reviewed/Updated: Reviewed All (Medical, Surgical, Family, Medications, Allergies, Care Teams, Patient Goals)    Allergies (verified) Alendronate  sodium, Codeine, Hydrocodone -acetaminophen , Parathyroid hormone (recomb), Requip  [ropinirole ], and Vicodin [hydrocodone -acetaminophen ]   Current Medications (verified) Outpatient Encounter Medications as of 06/22/2024  Medication Sig   amphetamine -dextroamphetamine  (ADDERALL) 20 MG tablet Take 1 tablet (20 mg total) by mouth 2 (two) times daily.   amphetamine -dextroamphetamine  (ADDERALL) 20 MG tablet Take 1 tablet (20 mg total) by mouth 2 (two) times daily.   amphetamine -dextroamphetamine   (ADDERALL) 20 MG  tablet Take 1 tablet (20 mg total) by mouth 2 (two) times daily.   NEURONTIN 300 MG capsule Take 1 capsule at bedtime for 3 days, then 1 capsule twice a day for 3 days, then 1 capsule 3 times a day   Oxycodone HCl 10 MG TABS Take 1 tablet every 4-6 hours by oral route as needed for 30 days.   propranolol  (INDERAL ) 20 MG tablet Take 1 tablet (20 mg total) by mouth 3 (three) times daily.   SUMAtriptan  (IMITREX ) 100 MG tablet TAKE 1 TABLET BY MOUTH AT ONSET OF HEADACHE. MAY REPEAT 1 TIME IN 2 HOURS IF HEADACHE PERSISTS OR RECURS. DO NOT EXCEED 2 TABLETS IN 24 HOURS   tiZANidine  (ZANAFLEX ) 4 MG tablet Take 4 mg by mouth every 8 (eight) hours as needed for muscle spasms.   [DISCONTINUED] meloxicam (MOBIC) 7.5 MG tablet Take 7.5 mg by mouth as needed.   No facility-administered encounter medications on file as of 06/22/2024.    History: Past Medical History:  Diagnosis Date   Cardiomyopathy (HCC)    Diverticulosis    Insomnia    Menopause    Migraine    Orthostatic hypotension    Osteoporosis    Other malaise and fatigue    Persistent disorder of initiating or maintaining sleep    POTS (postural orthostatic tachycardia syndrome)    Tachycardia, unspecified    Past Surgical History:  Procedure Laterality Date   COLONOSCOPY  2013   with Dr. Kristie   FINGER SURGERY Left    index-fusion   IR KYPHO LUMBAR INC FX REDUCE BONE BX UNI/BIL CANNULATION INC/IMAGING  11/11/2019   IR KYPHO LUMBAR INC FX REDUCE BONE BX UNI/BIL CANNULATION INC/IMAGING  12/17/2019   IR RADIOLOGIST EVAL & MGMT  11/05/2019   IR RADIOLOGIST EVAL & MGMT  12/09/2019   No prior surgery     WISDOM TOOTH EXTRACTION  1974   Family History  Problem Relation Age of Onset   Stroke Mother    Osteoporosis Mother    Colon polyps Mother 3   Alzheimer's disease Mother    Coronary artery disease Father        Died of MI at age 49   Asthma Father    Hypertension Father    Sudden death Father    Heart attack Father     Osteoporosis Sister    Diabetes Neg Hx    Hyperlipidemia Neg Hx    Colon cancer Neg Hx    Esophageal cancer Neg Hx    Stomach cancer Neg Hx    Rectal cancer Neg Hx    Social History   Occupational History    Comment: Medial lab technologist  Tobacco Use   Smoking status: Never   Smokeless tobacco: Never  Vaping Use   Vaping status: Never Used  Substance and Sexual Activity   Alcohol use: Yes    Alcohol/week: 1.0 standard drink of alcohol    Types: 1 Glasses of wine per week    Comment: less than 1 glass of wine a month   Drug use: No   Sexual activity: Yes    Partners: Male   Tobacco Counseling Counseling given: Not Answered  SDOH Screenings   Food Insecurity: No Food Insecurity (06/22/2024)  Housing: Low Risk  (06/22/2024)  Transportation Needs: No Transportation Needs (06/22/2024)  Utilities: Not At Risk (06/22/2024)  Alcohol Screen: Low Risk  (06/22/2024)  Depression (PHQ2-9): Low Risk  (06/22/2024)  Financial Resource Strain: Low Risk  (06/22/2024)  Physical  Activity: Sufficiently Active (06/22/2024)  Social Connections: Socially Integrated (06/22/2024)  Stress: No Stress Concern Present (06/22/2024)  Tobacco Use: Low Risk  (06/22/2024)   See flowsheets for full screening details  Depression Screen PHQ 2 & 9 Depression Scale- Over the past 2 weeks, how often have you been bothered by any of the following problems? Little interest or pleasure in doing things: 0 Feeling down, depressed, or hopeless (PHQ Adolescent also includes...irritable): 0 PHQ-2 Total Score: 0 Trouble falling or staying asleep, or sleeping too much: 1 (here and there has difficulty sleeping) Feeling tired or having little energy: 2 (feels tired but still pushes through and keeps going) Poor appetite or overeating (PHQ Adolescent also includes...weight loss): 0 Feeling bad about yourself - or that you are a failure or have let yourself or your family down: 0 Trouble concentrating on things, such as  reading the newspaper or watching television (PHQ Adolescent also includes...like school work): 0 Moving or speaking so slowly that other people could have noticed. Or the opposite - being so fidgety or restless that you have been moving around a lot more than usual: 0 Thoughts that you would be better off dead, or of hurting yourself in some way: 0 PHQ-9 Total Score: 3  Depression Treatment Depression Interventions/Treatment : PHQ2-9 Score <4 Follow-up Not Indicated     Goals Addressed             This Visit's Progress    To serve others in need more               Objective:    Today's Vitals   06/22/24 1448  BP: 113/83  Pulse: 86  Resp: 16  Temp: 97.8 F (36.6 C)  TempSrc: Oral  SpO2: 98%  Weight: 119 lb 12.8 oz (54.3 kg)  Height: 5' 9 (1.753 m)   Body mass index is 17.69 kg/m.  Hearing/Vision screen No results found. Immunizations and Health Maintenance Health Maintenance  Topic Date Due   Pneumococcal Vaccine: 50+ Years (1 of 2 - PCV) Never done   Zoster Vaccines- Shingrix (1 of 2) Never done   DTaP/Tdap/Td (2 - Tdap) 02/22/2013   Mammogram  04/10/2022   COVID-19 Vaccine (3 - 2025-26 season) 03/23/2024   Influenza Vaccine  10/20/2024 (Originally 02/21/2024)   Medicare Annual Wellness (AWV)  06/22/2025   Colonoscopy  09/19/2026   Bone Density Scan  Completed   Hepatitis C Screening  Completed   Meningococcal B Vaccine  Aged Out        Assessment/Plan:  This is a routine wellness examination for [place].  Patient Care Team: Antonio Meth, Jamee SAUNDERS, DO as PCP - General (Family Medicine) Braden, Keneth LABOR, DO as Consulting Physician (Psychiatry) Bonner Ade, MD as Consulting Physician (Physical Medicine and Rehabilitation) Skeet Juliene SAUNDERS, DO as Consulting Physician (Neurology) Cristino Rush, OD as Referring Physician (Optometry)  I have personally reviewed and noted the following in the patient's chart:   Medical and social history Use of alcohol,  tobacco or illicit drugs  Current medications and supplements including opioid prescriptions. Functional ability and status Nutritional status Physical activity Advanced directives List of other physicians Hospitalizations, surgeries, and ER visits in previous 12 months Vitals Screenings to include cognitive, depression, and falls Referrals and appointments  No orders of the defined types were placed in this encounter.  In addition, I have reviewed and discussed with patient certain preventive protocols, quality metrics, and best practice recommendations. A written personalized care plan for preventive services as  well as general preventive health recommendations were provided to patient.   Lolita Libra, CMA   06/22/2024   Return in 1 year (on 06/22/2025).  After Visit Summary: (In Person-Printed) AVS printed and given to the patient  Nurse Notes: nothing significant to report\

## 2024-06-22 NOTE — Progress Notes (Unsigned)
 Subjective:    Patient ID: Tracy Cook, female    DOB: [birth year], 68 y.o.   MRN: [id number]  Chief Complaint  Patient presents with   ADD    HPI Patient is in today for f/u add.   Discussed the use of AI scribe software for clinical note transcription with the patient, who gave verbal consent to proceed.  History of Present Illness Tracy Cook is a 68 year old female who presents with a request for medication refills and management of migraines.  She experiences frequent migraines, often waking up at night with them. She reports that her migraines often occur at night and that she has been focusing on her neck and sleeping position, and she notes that she clenches her jaw, which she believes is related to her Adderall use. She has been using sumatriptan  for migraine relief and requests a refill, indicating a high frequency of migraines recently. She has been focusing on her neck and sleeping position to alleviate symptoms.  She has a history of using Adderall, which she finds significantly helpful. Recently, she has experienced jaw clenching as a side effect, which was not previously an issue. Botox injections in her jaw have been administered to help with this, and she has reduced her Adderall intake to one pill per day. She has tried other medications like Vyvanse but found them ineffective.  In June, she experienced a high fever of 104F and was hospitalized. During this time, she underwent extensive testing, including a spinal tap. All tests returned negative results, and she was treated with IV antibiotics, which reduced her fever. She was unaware of her fever and was 'out of it' at the time, which led to her hospitalization. She recalls being tested for various conditions, including tick-borne illnesses, but no specific cause was identified.  No urinary issues or swelling in her legs. She mentions having 'skinny legs' and no joint problems. She does not report any current  issues with her medications, aside from the need for a sumatriptan  refill.    Past Medical History:  Diagnosis Date   Cardiomyopathy (HCC)    Diverticulosis    Insomnia    Menopause    Migraine    Orthostatic hypotension    Osteoporosis    Other malaise and fatigue    Persistent disorder of initiating or maintaining sleep    POTS (postural orthostatic tachycardia syndrome)    Tachycardia, unspecified     Past Surgical History:  Procedure Laterality Date   COLONOSCOPY  2013   with Dr. Kristie   FINGER SURGERY Left    index-fusion   IR KYPHO LUMBAR INC FX REDUCE BONE BX UNI/BIL CANNULATION INC/IMAGING  11/11/2019   IR KYPHO LUMBAR INC FX REDUCE BONE BX UNI/BIL CANNULATION INC/IMAGING  12/17/2019   IR RADIOLOGIST EVAL & MGMT  11/05/2019   IR RADIOLOGIST EVAL & MGMT  12/09/2019   No prior surgery     WISDOM TOOTH EXTRACTION  1974    Family History  Problem Relation Age of Onset   Stroke Mother    Osteoporosis Mother    Colon polyps Mother 23   Alzheimer's disease Mother    Coronary artery disease Father        Died of MI at age 44   Asthma Father    Hypertension Father    Sudden death Father    Heart attack Father    Osteoporosis Sister    Diabetes Neg Hx    Hyperlipidemia Neg Hx  Colon cancer Neg Hx    Esophageal cancer Neg Hx    Stomach cancer Neg Hx    Rectal cancer Neg Hx     Social History   Socioeconomic History   Marital status: Married    Spouse name: Not on file   Number of children: 2   Years of education: Not on file   Highest education level: Bachelor's degree (e.g., BA, AB, BS)  Occupational History    Comment: Medial lab technologist  Tobacco Use   Smoking status: Never   Smokeless tobacco: Never  Vaping Use   Vaping status: Never Used  Substance and Sexual Activity   Alcohol use: Yes    Alcohol/week: 1.0 standard drink of alcohol    Types: 1 Glasses of wine per week    Comment: less than 1 glass of wine a month   Drug use: No   Sexual  activity: Yes    Partners: Male  Other Topics Concern   Not on file  Social History Narrative   Exercise almost every day   Social Drivers of Health   Financial Resource Strain: Low Risk  (06/22/2024)   Overall Financial Resource Strain (CARDIA)    Difficulty of Paying Living Expenses: Not hard at all  Food Insecurity: No Food Insecurity (06/22/2024)   Hunger Vital Sign    Worried About Running Out of Food in the Last Year: Never true    Ran Out of Food in the Last Year: Never true  Transportation Needs: No Transportation Needs (06/22/2024)   PRAPARE - Administrator, Civil Service (Medical): No    Lack of Transportation (Non-Medical): No  Physical Activity: Sufficiently Active (06/22/2024)   Exercise Vital Sign    Days of Exercise per Week: 5 days    Minutes of Exercise per Session: 40 min  Stress: No Stress Concern Present (06/22/2024)   [employer] of Occupational Health - Occupational Stress Questionnaire    Feeling of Stress: Only a little  Social Connections: Socially Integrated (06/22/2024)   Social Connection and Isolation Panel    Frequency of Communication with Friends and Family: More than three times a week    Frequency of Social Gatherings with Friends and Family: Three times a week    Attends Religious Services: More than 4 times per year    Active Member of Clubs or Organizations: Yes    Attends Banker Meetings: More than 4 times per year    Marital Status: Married  Catering Manager Violence: Not At Risk (06/22/2024)   Humiliation, Afraid, Rape, and Kick questionnaire    Fear of Current or Ex-Partner: No    Emotionally Abused: No    Physically Abused: No    Sexually Abused: No    Outpatient Medications Prior to Visit  Medication Sig Dispense Refill   amphetamine -dextroamphetamine  (ADDERALL) 20 MG tablet Take 1 tablet (20 mg total) by mouth 2 (two) times daily. 60 tablet 0   amphetamine -dextroamphetamine  (ADDERALL) 20 MG tablet Take  1 tablet (20 mg total) by mouth 2 (two) times daily. 60 tablet 0   amphetamine -dextroamphetamine  (ADDERALL) 20 MG tablet Take 1 tablet (20 mg total) by mouth 2 (two) times daily. 60 tablet 0   NEURONTIN 300 MG capsule Take 1 capsule at bedtime for 3 days, then 1 capsule twice a day for 3 days, then 1 capsule 3 times a day     propranolol  (INDERAL ) 20 MG tablet Take 1 tablet (20 mg total) by mouth 3 (three) times  daily. 270 tablet 3   Oxycodone HCl 10 MG TABS Take 1 tablet every 4-6 hours by oral route as needed for 30 days.     SUMAtriptan  (IMITREX ) 100 MG tablet TAKE 1 TABLET BY MOUTH AT ONSET OF HEADACHE. MAY REPEAT 1 TIME IN 2 HOURS IF HEADACHE PERSISTS OR RECURS. DO NOT EXCEED 2 TABLETS IN 24 HOURS 12 tablet 1   tiZANidine  (ZANAFLEX ) 4 MG tablet Take 4 mg by mouth every 8 (eight) hours as needed for muscle spasms.     No facility-administered medications prior to visit.    Allergies  Allergen Reactions   Alendronate  Sodium Rash   Codeine Nausea And Vomiting   Hydrocodone -Acetaminophen  Nausea And Vomiting    Can take with zofran     Parathyroid Hormone (Recomb) Other (See Comments)    Bone pain and headaches Bone pain and headaches   Requip  [Ropinirole ] Nausea And Vomiting   Vicodin [Hydrocodone -Acetaminophen ] Nausea And Vomiting    Review of Systems  Constitutional:  Negative for fever and malaise/fatigue.  HENT:  Negative for congestion.   Eyes:  Negative for blurred vision.  Respiratory:  Negative for shortness of breath.   Cardiovascular:  Negative for chest pain, palpitations and leg swelling.  Gastrointestinal:  Negative for abdominal pain, blood in stool and nausea.  Genitourinary:  Negative for dysuria and frequency.  Musculoskeletal:  Negative for falls.  Skin:  Negative for rash.  Neurological:  Negative for dizziness, loss of consciousness and headaches.  Endo/Heme/Allergies:  Negative for environmental allergies.  Psychiatric/Behavioral:  Negative for depression. The  patient is not nervous/anxious.        Objective:    Physical Exam Vitals and nursing note reviewed.  Constitutional:      General: She is not in acute distress.    Appearance: Normal appearance. She is well-developed.  HENT:     Head: Normocephalic and atraumatic.  Eyes:     General: No scleral icterus.       Right eye: No discharge.        Left eye: No discharge.  Cardiovascular:     Rate and Rhythm: Normal rate and regular rhythm.     Heart sounds: No murmur heard. Pulmonary:     Effort: Pulmonary effort is normal. No respiratory distress.     Breath sounds: Normal breath sounds.  Musculoskeletal:        General: Normal range of motion.     Cervical back: Normal range of motion and neck supple.     Right lower leg: No edema.     Left lower leg: No edema.  Skin:    General: Skin is warm and dry.  Neurological:     Mental Status: She is alert and oriented to person, place, and time.  Psychiatric:        Mood and Affect: Mood normal.        Behavior: Behavior normal.        Thought Content: Thought content normal.        Judgment: Judgment normal.     BP 113/83 (Cuff Size: Normal)   Pulse 86   Temp 97.8 F (36.6 C)   Resp 16   Ht 5' 9 (1.753 m)   Wt 119 lb 12.8 oz (54.3 kg)   SpO2 98%   BMI 17.69 kg/m  Wt Readings from Last 3 Encounters:  06/22/24 119 lb 12.8 oz (54.3 kg)  06/22/24 119 lb 12.8 oz (54.3 kg)  12/23/23 116 lb 9.6 oz (52.9 kg)  Diabetic Foot Exam - Simple   No data filed    Lab Results  Component Value Date   WBC 9.8 11/30/2021   HGB 14.8 11/30/2021   HCT 44.3 11/30/2021   PLT 330.0 11/30/2021   GLUCOSE 93 11/30/2021   CHOL 149 02/05/2020   TRIG 98.0 02/05/2020   HDL 63.60 02/05/2020   LDLCALC 66 02/05/2020   ALT 21 11/30/2021   AST 24 11/30/2021   NA 141 11/30/2021   K 4.0 11/30/2021   CL 99 11/30/2021   CREATININE 1.33 (H) 11/30/2021   BUN 22 11/30/2021   CO2 34 (H) 11/30/2021   TSH 0.27 (L) 11/30/2021   INR 1.0  12/17/2019   HGBA1C 5.8 02/07/2017    Lab Results  Component Value Date   TSH 0.27 (L) 11/30/2021   Lab Results  Component Value Date   WBC 9.8 11/30/2021   HGB 14.8 11/30/2021   HCT 44.3 11/30/2021   MCV 89.9 11/30/2021   PLT 330.0 11/30/2021   Lab Results  Component Value Date   NA 141 11/30/2021   K 4.0 11/30/2021   CO2 34 (H) 11/30/2021   GLUCOSE 93 11/30/2021   BUN 22 11/30/2021   CREATININE 1.33 (H) 11/30/2021   BILITOT 0.5 11/30/2021   ALKPHOS 79 11/30/2021   AST 24 11/30/2021   ALT 21 11/30/2021   PROT 6.7 11/30/2021   ALBUMIN 4.4 11/30/2021   CALCIUM 10.3 11/30/2021   GFR 42.21 (L) 11/30/2021   Lab Results  Component Value Date   CHOL 149 02/05/2020   Lab Results  Component Value Date   HDL 63.60 02/05/2020   Lab Results  Component Value Date   LDLCALC 66 02/05/2020   Lab Results  Component Value Date   TRIG 98.0 02/05/2020   Lab Results  Component Value Date   CHOLHDL 2 02/05/2020   Lab Results  Component Value Date   HGBA1C 5.8 02/07/2017       Assessment & Plan:  Other migraine without status migrainosus, not intractable -     SUMAtriptan  Succinate; TAKE 1 TABLET BY MOUTH AT ONSET OF HEADACHE. MAY REPEAT 1 TIME IN 2 HOURS IF HEADACHE PERSISTS OR RECURS. DO NOT EXCEED 2 TABLETS IN 24 HOURS  Dispense: 12 tablet; Refill: 1   Assessment and Plan Assessment & Plan Migraine   She experiences increased frequency of migraines, often waking her at night. There may be an association with neck muscle tension and jaw clenching, potentially exacerbated by Adderall use. Botox injections in the jaw have been administered to address clenching. Refilled sumatriptan  prescription and signed medication contract.  General Health Maintenance   Headache/migraine management was discussed during this visit.   Gianna Calef R Lowne Chase, DO

## 2024-06-22 NOTE — Patient Instructions (Signed)
 Tracy Cook,  Thank you for taking the time for your Medicare Wellness Visit. I appreciate your continued commitment to your health goals. Please review the care plan we discussed, and feel free to reach out if I can assist you further.  Please note that Annual Wellness Visits do not include a physical exam. Some assessments may be limited, especially if the visit was conducted virtually. If needed, we may recommend an in-person follow-up with your provider.  Goals:  To serve others in need more  Ongoing Care Seeing your primary care provider every 3 to 6 months helps us  monitor your health and provide consistent, personalized care.   Dr Antonio Meth:  today Medicare AWV:  06/23/25 1pm  Referrals If a referral was made during today's visit and you haven't received any updates within two weeks, please contact the referred provider directly to check on the status.  [employer] [employer] [place]):  [phone]. Please stop by on your way out and schedule it while you're here.  Recommended Screenings: You will need to get the following vaccines at your local pharmacy: Tetanus, Shingles and pneumonia (if you change your mind)  Health Maintenance  Topic Date Due   Pneumococcal Vaccine for age over 15 (1 of 2 - PCV) Never done   Zoster (Shingles) Vaccine (1 of 2) Never done   DTaP/Tdap/Td vaccine (2 - Tdap) 02/22/2013   Breast Cancer Screening  04/10/2022   COVID-19 Vaccine (3 - 2025-26 season) 03/23/2024   Flu Shot  10/20/2024*   Medicare Annual Wellness Visit  06/22/2025   Colon Cancer Screening  09/19/2026   Osteoporosis screening with Bone Density Scan  Completed   Hepatitis C Screening  Completed   Meningitis B Vaccine  Aged Out  *Topic was postponed. The date shown is not the original due date.       06/21/2024    3:21 PM  Advanced Directives  Does Patient Have a Medical Advance Directive? Yes  Type of Estate Agent of [name];Living will  Does  patient want to make changes to medical advance directive? No - Patient declined  Copy of Healthcare Power of Attorney in Chart? No - copy requested  Once completed and notarized, you may return a copy of your Advanced Directive(s) by either of the following:  Please bring a copy of your health care power of attorney and living will to the office to be added to your chart at your convenience. You can also mail a copy to [facility] [address] [place], [place] [address] or email to [email or web address]   Vision: Annual vision screenings are recommended for early detection of glaucoma, cataracts, and diabetic retinopathy. These exams can also reveal signs of chronic conditions such as diabetes and high blood pressure.  Dental: Annual dental screenings help detect early signs of oral cancer, gum disease, and other conditions linked to overall health, including heart disease and diabetes.  Please see the attached documents for additional preventive care recommendations.

## 2024-06-23 ENCOUNTER — Encounter: Payer: Self-pay | Admitting: Family Medicine

## 2024-07-01 ENCOUNTER — Encounter: Payer: Self-pay | Admitting: Family Medicine

## 2024-07-01 DIAGNOSIS — F988 Other specified behavioral and emotional disorders with onset usually occurring in childhood and adolescence: Secondary | ICD-10-CM

## 2024-07-01 MED ORDER — AMPHETAMINE-DEXTROAMPHETAMINE 20 MG PO TABS: 20.0000 mg | ORAL_TABLET | Freq: Two times a day (BID) | ORAL | 0 refills | Status: DC

## 2024-07-01 NOTE — Telephone Encounter (Signed)
 Requesting: Adderall Contract: 11/22/22 UDS: 11/22/2022 Last OV: 06/22/2024 Next OV: 12/02 AWV Last Refill: 02/22/2024, #60--0 RF Database:   Please advise

## 2024-08-03 ENCOUNTER — Encounter: Payer: Self-pay | Admitting: *Deleted

## 2024-08-13 ENCOUNTER — Other Ambulatory Visit: Payer: Self-pay | Admitting: Family

## 2024-08-13 DIAGNOSIS — F988 Other specified behavioral and emotional disorders with onset usually occurring in childhood and adolescence: Secondary | ICD-10-CM

## 2024-08-13 NOTE — Telephone Encounter (Signed)
 Requesting:Adderall 20mg   Contract: 07/02/24 UDS: 12/08/23 Last Visit: 06/22/24 Next Visit: None Last Refill: 07/01/24 #60 and 0RF   Please Advise.

## 2025-06-23 ENCOUNTER — Ambulatory Visit
